# Patient Record
Sex: Male | Born: 1945 | Race: White | Hispanic: No | Marital: Married
Health system: Southern US, Community
[De-identification: ages and names within clinical notes are randomized; demographics above are authoritative.]

## PROBLEM LIST (undated history)

## (undated) DIAGNOSIS — C61 Malignant neoplasm of prostate: Secondary | ICD-10-CM

## (undated) DIAGNOSIS — I639 Cerebral infarction, unspecified: Secondary | ICD-10-CM

## (undated) DIAGNOSIS — I2510 Atherosclerotic heart disease of native coronary artery without angina pectoris: Secondary | ICD-10-CM

## (undated) DIAGNOSIS — G43909 Migraine, unspecified, not intractable, without status migrainosus: Secondary | ICD-10-CM

## (undated) DIAGNOSIS — M1990 Unspecified osteoarthritis, unspecified site: Secondary | ICD-10-CM

## (undated) DIAGNOSIS — K219 Gastro-esophageal reflux disease without esophagitis: Secondary | ICD-10-CM

## (undated) DIAGNOSIS — I10 Essential (primary) hypertension: Secondary | ICD-10-CM

## (undated) HISTORY — PX: EYE SURGERY: SHX253

## (undated) HISTORY — PX: PROSTATE BIOPSY: SHX241

## (undated) HISTORY — DX: Migraine, unspecified, not intractable, without status migrainosus: G43.909

## (undated) HISTORY — DX: Cerebral infarction, unspecified: I63.9

## (undated) HISTORY — PX: BACK SURGERY: SHX140

---

## 1981-11-01 HISTORY — PX: BACK SURGERY: SHX140

## 2013-06-10 ENCOUNTER — Emergency Department (HOSPITAL_COMMUNITY): Payer: Medicare HMO

## 2013-06-10 ENCOUNTER — Inpatient Hospital Stay (HOSPITAL_COMMUNITY)
Admission: EM | Admit: 2013-06-10 | Discharge: 2013-06-11 | DRG: 066 | Disposition: A | Discharge status: Home / Self Care | Payer: Medicare HMO | Attending: Internal Medicine | Admitting: Internal Medicine

## 2013-06-10 ENCOUNTER — Encounter (HOSPITAL_COMMUNITY): Payer: Self-pay | Admitting: *Deleted

## 2013-06-10 DIAGNOSIS — Z8249 Family history of ischemic heart disease and other diseases of the circulatory system: Secondary | ICD-10-CM

## 2013-06-10 DIAGNOSIS — Z7902 Long term (current) use of antithrombotics/antiplatelets: Secondary | ICD-10-CM

## 2013-06-10 DIAGNOSIS — R7309 Other abnormal glucose: Secondary | ICD-10-CM | POA: Diagnosis not present

## 2013-06-10 DIAGNOSIS — G459 Transient cerebral ischemic attack, unspecified: Secondary | ICD-10-CM | POA: Diagnosis present

## 2013-06-10 DIAGNOSIS — I63219 Cerebral infarction due to unspecified occlusion or stenosis of unspecified vertebral arteries: Principal | ICD-10-CM | POA: Diagnosis present

## 2013-06-10 DIAGNOSIS — I639 Cerebral infarction, unspecified: Secondary | ICD-10-CM

## 2013-06-10 DIAGNOSIS — Z7982 Long term (current) use of aspirin: Secondary | ICD-10-CM

## 2013-06-10 DIAGNOSIS — I672 Cerebral atherosclerosis: Secondary | ICD-10-CM | POA: Diagnosis present

## 2013-06-10 DIAGNOSIS — I10 Essential (primary) hypertension: Secondary | ICD-10-CM | POA: Diagnosis present

## 2013-06-10 DIAGNOSIS — R42 Dizziness and giddiness: Secondary | ICD-10-CM | POA: Diagnosis present

## 2013-06-10 DIAGNOSIS — I6502 Occlusion and stenosis of left vertebral artery: Secondary | ICD-10-CM

## 2013-06-10 DIAGNOSIS — Z79899 Other long term (current) drug therapy: Secondary | ICD-10-CM

## 2013-06-10 DIAGNOSIS — Z808 Family history of malignant neoplasm of other organs or systems: Secondary | ICD-10-CM

## 2013-06-10 HISTORY — DX: Cerebral infarction, unspecified: I63.9

## 2013-06-10 HISTORY — DX: Unspecified osteoarthritis, unspecified site: M19.90

## 2013-06-10 HISTORY — DX: Essential (primary) hypertension: I10

## 2013-06-10 LAB — CBC
Hemoglobin: 15.6 g/dL (ref 13.0–17.0)
MCH: 29.9 pg (ref 26.0–34.0)
MCV: 83.7 fL (ref 78.0–100.0)
RBC: 5.22 MIL/uL (ref 4.22–5.81)

## 2013-06-10 LAB — BASIC METABOLIC PANEL
BUN: 14 mg/dL (ref 6–23)
CO2: 24 mEq/L (ref 19–32)
Calcium: 9.2 mg/dL (ref 8.4–10.5)
Creatinine, Ser: 0.82 mg/dL (ref 0.50–1.35)
Glucose, Bld: 173 mg/dL — ABNORMAL HIGH (ref 70–99)

## 2013-06-10 MED ORDER — ASPIRIN 325 MG PO TABS
325.0000 mg | ORAL_TABLET | Freq: Once | ORAL | Status: AC
  Administered 2013-06-10: 325 mg via ORAL
  Filled 2013-06-10: qty 1

## 2013-06-10 MED ORDER — SODIUM CHLORIDE 0.9 % IV BOLUS (SEPSIS)
500.0000 mL | Freq: Once | INTRAVENOUS | Status: AC
  Administered 2013-06-10: 500 mL via INTRAVENOUS

## 2013-06-10 MED ORDER — SODIUM CHLORIDE 0.9 % IV BOLUS (SEPSIS): 500.0000 mL | Freq: Once | INTRAVENOUS | Status: DC

## 2013-06-10 MED ORDER — IOHEXOL 350 MG/ML SOLN
100.0000 mL | Freq: Once | INTRAVENOUS | Status: AC | PRN
  Administered 2013-06-10: 100 mL via INTRAVENOUS

## 2013-06-10 NOTE — ED Provider Notes (Signed)
CSN: [id number]     Arrival date & time 06/10/13  1944 History     First MD Initiated Contact with Patient 06/10/13 2016     Chief Complaint  Patient presents with  . Dizziness   (Consider location/radiation/quality/duration/timing/severity/associated sxs/prior Treatment) HPI Comments: 67 year old male with a past medical history of hypertension presents to the emergency department complaining of intermittent dizziness x2 weeks. Dizziness worse when going from a laying to standing position. 2 days ago he was outside mowing the lawn and developed left eye pain and jaw pain which lasted about 10 minutes and then subsided spontaneously. Those symptoms have not returned. Denies visual disturbance. Earlier today the dizziness became very severe, he developed mild left-sided nonradiating back pain behind his scapula, worse with movement. He then became very diaphoretic, soaking in entire shirt per wife. Prior to coming to the emergency department and got very nauseous and began vomiting. Denies chest pain, shortness of breath, confusion. No history of heart attack or stroke. He does not have a specific primary care physician, blood pressure medications are prescribed at the [place].  The history is provided by the patient and the spouse.    Past Medical History  Diagnosis Date  . Hypertension   . Arthritis    Past Surgical History  Procedure Laterality Date  . Back surgery     No family history on file. History  Substance Use Topics  . Smoking status: Never Smoker   . Smokeless tobacco: Not on file  . Alcohol Use: No    Review of Systems  Constitutional: Positive for diaphoresis. Negative for fever and chills.  HENT: Negative for neck pain and neck stiffness.   Eyes: Positive for pain. Negative for visual disturbance.  Respiratory: Negative for shortness of breath.   Cardiovascular: Negative for chest pain.  Gastrointestinal: Positive for nausea and vomiting. Negative for abdominal pain.   Musculoskeletal: Positive for back pain.  Neurological: Positive for dizziness and headaches. Negative for syncope and speech difficulty.  Psychiatric/Behavioral: Negative for confusion.  All other systems reviewed and are negative.    Allergies  Review of patient's allergies indicates no known allergies.  Home Medications   Current Outpatient Rx  Name  Route  Sig  Dispense  Refill  . amLODipine (NORVASC) 5 MG tablet   Oral   Take 5 mg by mouth daily.          BP 149/70  Pulse 88  Temp(Src) 97.9 F (36.6 C) (Oral)  Resp 18  Wt 205 lb (92.987 kg)  SpO2 100% Physical Exam  Nursing note and vitals reviewed. Constitutional: He is oriented to person, place, and time. He appears well-developed and well-nourished. No distress.  HENT:  Head: Normocephalic and atraumatic.  Mouth/Throat: Oropharynx is clear and moist.  Eyes: Conjunctivae and EOM are normal. Pupils are equal, round, and reactive to light.  Neck: Normal range of motion. Neck supple. No JVD present.  Cardiovascular: Normal rate, normal heart sounds, intact distal pulses and normal pulses.  An irregular rhythm present.  No extremity edema.  Pulmonary/Chest: Effort normal and breath sounds normal. He has no decreased breath sounds. He has no wheezes. He has no rhonchi. He has no rales. He exhibits no tenderness.  Abdominal: Soft. Bowel sounds are normal. There is no tenderness.  Musculoskeletal: Normal range of motion. He exhibits no edema.       Thoracic back: He exhibits no tenderness.  No scapular tenderness.  Neurological: He is alert and oriented to person, place, and  time. He has normal strength. No cranial nerve deficit or sensory deficit.  Skin: Skin is warm and dry. He is not diaphoretic.  Psychiatric: He has a normal mood and affect. His behavior is normal.    ED Course   Procedures (including critical care time)  Labs Reviewed  CBC  BASIC METABOLIC PANEL  TROPONIN I    Date: 06/10/2013  Rate:  74  Rhythm: normal sinus rhythm and Ventricular trigeminy  QRS Axis: left  Intervals: normal  ST/T Wave abnormalities: normal  Conduction Disutrbances:Borderline intraventricular conduction delay  Narrative Interpretation: No STEMI  Old EKG Reviewed: none available   Ct Angio Head W/cm &/or Wo Cm  06/10/2013   *RADIOLOGY REPORT*  Clinical Data:  Dizziness.  On and off for 2 weeks.  Left cheek and eye changes for 10 minutes.  CT ANGIOGRAPHY HEAD AND NECK  Technique:  Multidetector CT imaging of the head and neck was performed using the standard protocol during bolus administration of intravenous contrast.  Multiplanar CT image reconstructions including MIPs were obtained to evaluate the vascular anatomy. Carotid stenosis measurements (when applicable) are obtained utilizing NASCET criteria, using the distal internal carotid diameter as the denominator.  Contrast: OMNIPAQUE IOHEXOL 350 MG/ML SOLN  Comparison:   None.  CTA NECK  Findings:  Aortic arch is not imaged. The proximal great vessels are also not included.  Cervical carotid atherosclerosis at the bifurcations, without significant stenosis.  Left vertebral artery occlusion, or effective occlusion, beginning at the V1-V2 junction, continuing nearly all the way to the basilar.  Mild left vertebral artery dominance.  The right vertebral artery is patent, although there is moderate to advanced narrowing just beyond the dural penetration, presumably atherosclerotic.   Review of the MIP images confirms the above findings.  IMPRESSION: 1.  Left vertebral artery occlusion beginning at the proximal V2 segment, continuing just proximal to the vertebral basilar junction.  Likely dominant left AICA, serving the majority of the left inferior cerebellum. 2.  Tandem moderate to advanced right intradural vertebral artery stenoses, presumably atherosclerotic.  3.  Cervical carotid bifurcation atherosclerosis, without significant stenosis. 4.  Aortic arch and  proximal great vessels not imaged.  Findings:  Noncontrast portion:  No acute osseous findings.  The imaged paranasal sinuses are essentially clear.  Orbits are unremarkable.  The left vertebral artery is high density, correlating with thrombosis.  No definite acute ischemia in the posterior circulation territories, although in the setting of dizziness there may be an occult infarct. No delayed enhancement to suggest subacute ischemia.  No hemorrhage, hydrocephalus, or evidence of mass lesion.  Anterior circulation notable for moderate carotid siphon atherosclerosis with luminal irregularities.  No high-grade stenosis.  No anterior circulation aneurysm.  Posterior circulation positive as above, with left V3 and V4 occlusion and moderate to advanced tandem right V4 stenoses around 1 cm apart.  Dominant left AICA.  Fetal type left PCA, although left P1 is present.  No significant stenosis involving PCA territories.  No aneurysm.   Review of the MIP images confirms the above findings.  These results were called by telephone on 06/10/2013 at 22 30 to P A Albert, who verbally acknowledged these results.  IMPRESSION:  1.  Left vertebral artery occlusion, as above. No acute infarct is detected, but in the setting of dizziness there may be small cerebellar infarcts.  Consider MRI correlate. A large left AICA serves the majority of the inferior left cerebellum.  2.  Tandem moderate to advanced intradural right vertebral  artery stenoses - presumably atherosclerotic  3.  Moderate carotid siphon atherosclerosis.  4. Large left PCOM artery.   Original Report Authenticated By: Tiburcio Pea   Dg Chest 2 View  06/10/2013   *RADIOLOGY REPORT*  Clinical Data: Syncope and dizziness.  CHEST - 2 VIEW  Comparison: No priors for  Findings: Lung volumes are normal.  No consolidative airspace disease.  No pleural effusions.  No pneumothorax.  No pulmonary nodule or mass noted.  Pulmonary vasculature and the cardiomediastinal silhouette  are within normal limits. Atherosclerosis of the thoracic aorta.  IMPRESSION: 1. No radiographic evidence of acute cardiopulmonary disease. 2.  Atherosclerosis.   Original Report Authenticated By: Trudie Reed, M.D.   Ct Angio Neck W/cm &/or Wo/cm  06/10/2013   *RADIOLOGY REPORT*  Clinical Data:  Dizziness.  On and off for 2 weeks.  Left cheek and eye changes for 10 minutes.  CT ANGIOGRAPHY HEAD AND NECK  Technique:  Multidetector CT imaging of the head and neck was performed using the standard protocol during bolus administration of intravenous contrast.  Multiplanar CT image reconstructions including MIPs were obtained to evaluate the vascular anatomy. Carotid stenosis measurements (when applicable) are obtained utilizing NASCET criteria, using the distal internal carotid diameter as the denominator.  Contrast: OMNIPAQUE IOHEXOL 350 MG/ML SOLN  Comparison:   None.  CTA NECK  Findings:  Aortic arch is not imaged. The proximal great vessels are also not included.  Cervical carotid atherosclerosis at the bifurcations, without significant stenosis.  Left vertebral artery occlusion, or effective occlusion, beginning at the V1-V2 junction, continuing nearly all the way to the basilar.  Mild left vertebral artery dominance.  The right vertebral artery is patent, although there is moderate to advanced narrowing just beyond the dural penetration, presumably atherosclerotic.   Review of the MIP images confirms the above findings.  IMPRESSION: 1.  Left vertebral artery occlusion beginning at the proximal V2 segment, continuing just proximal to the vertebral basilar junction.  Likely dominant left AICA, serving the majority of the left inferior cerebellum. 2.  Tandem moderate to advanced right intradural vertebral artery stenoses, presumably atherosclerotic.  3.  Cervical carotid bifurcation atherosclerosis, without significant stenosis. 4.  Aortic arch and proximal great vessels not imaged.  Findings:   Noncontrast portion:  No acute osseous findings.  The imaged paranasal sinuses are essentially clear.  Orbits are unremarkable.  The left vertebral artery is high density, correlating with thrombosis.  No definite acute ischemia in the posterior circulation territories, although in the setting of dizziness there may be an occult infarct. No delayed enhancement to suggest subacute ischemia.  No hemorrhage, hydrocephalus, or evidence of mass lesion.  Anterior circulation notable for moderate carotid siphon atherosclerosis with luminal irregularities.  No high-grade stenosis.  No anterior circulation aneurysm.  Posterior circulation positive as above, with left V3 and V4 occlusion and moderate to advanced tandem right V4 stenoses around 1 cm apart.  Dominant left AICA.  Fetal type left PCA, although left P1 is present.  No significant stenosis involving PCA territories.  No aneurysm.   Review of the MIP images confirms the above findings.  These results were called by telephone on 06/10/2013 at 22 30 to P A Albert, who verbally acknowledged these results.  IMPRESSION:  1.  Left vertebral artery occlusion, as above. No acute infarct is detected, but in the setting of dizziness there may be small cerebellar infarcts.  Consider MRI correlate. A large left AICA serves the majority of the inferior left  cerebellum.  2.  Tandem moderate to advanced intradural right vertebral artery stenoses - presumably atherosclerotic  3.  Moderate carotid siphon atherosclerosis.  4. Large left PCOM artery.   Original Report Authenticated By: Tiburcio Pea   1. Vertebral artery occlusion, left   2. Dizziness     MDM  Patient with dizziness, diaphoresis and vomiting. Symptoms of left eye pain and jaw pain 2 days back back lasting 10 minutes. No chest pain or shortness of breath. On exam he has an irregular heart rhythm. Concern for cardiac cause or vascular cause. Blood pressure equal in both arms. Neurologic exam unremarkable. Labs  pending- CBC, BMP, troponin. EKG showing ventricular trigeminy, no STEMI. No old EKG to compare. CT head, CT angiogram and neck pending to rule out any dissection. Patient discussed with Dr. Jodi Mourning who agrees with plan of care. 11:32 PM CT angio scan with results as shown above. 325 mg ASA given. He will be admitted to the hospital for further management. Admission accepted by Dr.Kakrakandy, TRH.  Trevor Mace, PA-C 06/10/13 2333

## 2013-06-10 NOTE — H&P (Addendum)
[facility] History and Physical  ABDURAHMAN Myers ZOX:[id number] DOB: 06-Mar-1946 DOA: 06/10/2013  Referring physician: ER physician. PCP: No primary provider on file. [facility].  Chief Complaint: Dizziness.  HPI: Randy Myers is a 67 y.o. male with history of hypertension and arthritis presents with complaints of dizziness. Patient has been feeling dizzy for last 2 weeks. Patient gets dizzy when he tries to stand up or move sideways. His dizziness comes off and on. 3 days ago patient had some left facial numbness with left retro-orbital pain which lasted for around 10 minutes and resolved spontaneously. Today patient after walking in his yard and came back to his house started getting dizzy and became very diaphoretic. At this point patient came to the ER. Patient also had one episode of nausea vomiting. Patient did not lose function of his upper lower extremities. Did not have any difficulty speaking or swallowing. CT angiogram of the head and neck showed left vertebral artery occlusion. Patient has been admitted for further management. Patient denies any chest pain or shortness of breath.  Review of Systems: As presented in the history of presenting illness, rest negative.  Past Medical History  Diagnosis Date  . Hypertension   . Arthritis    Past Surgical History  Procedure Laterality Date  . Back surgery     Social History:  reports that he has never smoked. He does not have any smokeless tobacco history on file. He reports that he does not drink alcohol or use illicit drugs. Home. where does patient live-- Can do ADLs. Can patient participate in ADLs?  No Known Allergies  Family History  Problem Relation Age of Onset  . CAD Father   . Brain cancer Sister       Prior to Admission medications   Medication Sig Start Date End Date Taking? Authorizing Provider  amLODipine (NORVASC) 10 MG tablet Take 5 mg by mouth daily.   Yes Historical Provider, MD  aspirin 325  MG tablet Take 650 mg by mouth every 4 (four) hours as needed for pain.   Yes Historical Provider, MD  ibuprofen (ADVIL,MOTRIN) 200 MG tablet Take 400 mg by mouth every 6 (six) hours as needed for pain.   Yes Historical Provider, MD  Multiple Vitamin (MULTIVITAMIN WITH MINERALS) TABS tablet Take 1 tablet by mouth daily. One a Day   Yes Historical Provider, MD   Physical Exam: Filed Vitals:   06/10/13 1957 06/10/13 2049 06/10/13 2049  BP: 150/90 157/73 149/70  Pulse: 88    Temp: 97.9 F (36.6 C)    TempSrc: Oral    Resp: 18    Weight: 92.987 kg (205 lb)    SpO2: 100%       General:  Well-developed well-nourished.  Eyes: Anicteric no pallor.  ENT: No discharge from the ears eyes nose or mouth.  Neck: No mass felt.  Cardiovascular: S1-S2 heard.  Respiratory: No rhonchi crepitations.  Abdomen: Soft nontender bowel sounds present.  Skin: No rash.  Musculoskeletal: No edema.  Psychiatric: Appears normal.  Neurologic: Alert awake oriented to time place and person. Moves all extremities.  Labs on Admission:  Basic Metabolic Panel:  Recent Labs Lab 06/10/13 2040  NA 139  K 3.5  CL 103  CO2 24  GLUCOSE 173*  BUN 14  CREATININE 0.82  CALCIUM 9.2   Liver Function Tests: No results found for this basename: AST, ALT, ALKPHOS, BILITOT, PROT, ALBUMIN,  in the last 168 hours No results found for this  basename: LIPASE, AMYLASE,  in the last 168 hours No results found for this basename: AMMONIA,  in the last 168 hours CBC:  Recent Labs Lab 06/10/13 2040  WBC 7.8  HGB 15.6  HCT 43.7  MCV 83.7  PLT 173   Cardiac Enzymes:  Recent Labs Lab 06/10/13 2040  TROPONINI <0.30    BNP (last 3 results) No results found for this basename: PROBNP,  in the last 8760 hours CBG: No results found for this basename: GLUCAP,  in the last 168 hours  Radiological Exams on Admission: Ct Angio Head W/cm &/or Wo Cm  06/10/2013   *RADIOLOGY REPORT*  Clinical Data:  Dizziness.   On and off for 2 weeks.  Left cheek and eye changes for 10 minutes.  CT ANGIOGRAPHY HEAD AND NECK  Technique:  Multidetector CT imaging of the head and neck was performed using the standard protocol during bolus administration of intravenous contrast.  Multiplanar CT image reconstructions including MIPs were obtained to evaluate the vascular anatomy. Carotid stenosis measurements (when applicable) are obtained utilizing NASCET criteria, using the distal internal carotid diameter as the denominator.  Contrast: OMNIPAQUE IOHEXOL 350 MG/ML SOLN  Comparison:   None.  CTA NECK  Findings:  Aortic arch is not imaged. The proximal great vessels are also not included.  Cervical carotid atherosclerosis at the bifurcations, without significant stenosis.  Left vertebral artery occlusion, or effective occlusion, beginning at the V1-V2 junction, continuing nearly all the way to the basilar.  Mild left vertebral artery dominance.  The right vertebral artery is patent, although there is moderate to advanced narrowing just beyond the dural penetration, presumably atherosclerotic.   Review of the MIP images confirms the above findings.  IMPRESSION: 1.  Left vertebral artery occlusion beginning at the proximal V2 segment, continuing just proximal to the vertebral basilar junction.  Likely dominant left AICA, serving the majority of the left inferior cerebellum. 2.  Tandem moderate to advanced right intradural vertebral artery stenoses, presumably atherosclerotic.  3.  Cervical carotid bifurcation atherosclerosis, without significant stenosis. 4.  Aortic arch and proximal great vessels not imaged.  Findings:  Noncontrast portion:  No acute osseous findings.  The imaged paranasal sinuses are essentially clear.  Orbits are unremarkable.  The left vertebral artery is high density, correlating with thrombosis.  No definite acute ischemia in the posterior circulation territories, although in the setting of dizziness there may be an  occult infarct. No delayed enhancement to suggest subacute ischemia.  No hemorrhage, hydrocephalus, or evidence of mass lesion.  Anterior circulation notable for moderate carotid siphon atherosclerosis with luminal irregularities.  No high-grade stenosis.  No anterior circulation aneurysm.  Posterior circulation positive as above, with left V3 and V4 occlusion and moderate to advanced tandem right V4 stenoses around 1 cm apart.  Dominant left AICA.  Fetal type left PCA, although left P1 is present.  No significant stenosis involving PCA territories.  No aneurysm.   Review of the MIP images confirms the above findings.  These results were called by telephone on 06/10/2013 at 22 30 to P A Albert, who verbally acknowledged these results.  IMPRESSION:  1.  Left vertebral artery occlusion, as above. No acute infarct is detected, but in the setting of dizziness there may be small cerebellar infarcts.  Consider MRI correlate. A large left AICA serves the majority of the inferior left cerebellum.  2.  Tandem moderate to advanced intradural right vertebral artery stenoses - presumably atherosclerotic  3.  Moderate carotid siphon  atherosclerosis.  4. Large left PCOM artery.   Original Report Authenticated By: Tiburcio Pea   Dg Chest 2 View  06/10/2013   *RADIOLOGY REPORT*  Clinical Data: Syncope and dizziness.  CHEST - 2 VIEW  Comparison: No priors for  Findings: Lung volumes are normal.  No consolidative airspace disease.  No pleural effusions.  No pneumothorax.  No pulmonary nodule or mass noted.  Pulmonary vasculature and the cardiomediastinal silhouette are within normal limits. Atherosclerosis of the thoracic aorta.  IMPRESSION: 1. No radiographic evidence of acute cardiopulmonary disease. 2.  Atherosclerosis.   Original Report Authenticated By: Trudie Reed, M.D.   Ct Angio Neck W/cm &/or Wo/cm  06/10/2013   *RADIOLOGY REPORT*  Clinical Data:  Dizziness.  On and off for 2 weeks.  Left cheek and eye changes  for 10 minutes.  CT ANGIOGRAPHY HEAD AND NECK  Technique:  Multidetector CT imaging of the head and neck was performed using the standard protocol during bolus administration of intravenous contrast.  Multiplanar CT image reconstructions including MIPs were obtained to evaluate the vascular anatomy. Carotid stenosis measurements (when applicable) are obtained utilizing NASCET criteria, using the distal internal carotid diameter as the denominator.  Contrast: OMNIPAQUE IOHEXOL 350 MG/ML SOLN  Comparison:   None.  CTA NECK  Findings:  Aortic arch is not imaged. The proximal great vessels are also not included.  Cervical carotid atherosclerosis at the bifurcations, without significant stenosis.  Left vertebral artery occlusion, or effective occlusion, beginning at the V1-V2 junction, continuing nearly all the way to the basilar.  Mild left vertebral artery dominance.  The right vertebral artery is patent, although there is moderate to advanced narrowing just beyond the dural penetration, presumably atherosclerotic.   Review of the MIP images confirms the above findings.  IMPRESSION: 1.  Left vertebral artery occlusion beginning at the proximal V2 segment, continuing just proximal to the vertebral basilar junction.  Likely dominant left AICA, serving the majority of the left inferior cerebellum. 2.  Tandem moderate to advanced right intradural vertebral artery stenoses, presumably atherosclerotic.  3.  Cervical carotid bifurcation atherosclerosis, without significant stenosis. 4.  Aortic arch and proximal great vessels not imaged.  Findings:  Noncontrast portion:  No acute osseous findings.  The imaged paranasal sinuses are essentially clear.  Orbits are unremarkable.  The left vertebral artery is high density, correlating with thrombosis.  No definite acute ischemia in the posterior circulation territories, although in the setting of dizziness there may be an occult infarct. No delayed enhancement to suggest  subacute ischemia.  No hemorrhage, hydrocephalus, or evidence of mass lesion.  Anterior circulation notable for moderate carotid siphon atherosclerosis with luminal irregularities.  No high-grade stenosis.  No anterior circulation aneurysm.  Posterior circulation positive as above, with left V3 and V4 occlusion and moderate to advanced tandem right V4 stenoses around 1 cm apart.  Dominant left AICA.  Fetal type left PCA, although left P1 is present.  No significant stenosis involving PCA territories.  No aneurysm.   Review of the MIP images confirms the above findings.  These results were called by telephone on 06/10/2013 at 22 30 to P A Albert, who verbally acknowledged these results.  IMPRESSION:  1.  Left vertebral artery occlusion, as above. No acute infarct is detected, but in the setting of dizziness there may be small cerebellar infarcts.  Consider MRI correlate. A large left AICA serves the majority of the inferior left cerebellum.  2.  Tandem moderate to advanced intradural right vertebral  artery stenoses - presumably atherosclerotic  3.  Moderate carotid siphon atherosclerosis.  4. Large left PCOM artery.   Original Report Authenticated By: Tiburcio Pea    EKG: Independently reviewed. Normal sinus rhythm with PVCs.  Assessment/Plan Principal Problem:   Dizziness Active Problems:   HTN (hypertension)   1. Dizziness - symptoms concerning for TIA versus CVA. Patient has been admitted for further management. Placed on neurochecks. I have discussed with neurologist on-call Dr. Noel Christmas. Patient will be transferred to [facility] for further management. MRI brain and 2-D echo. Check orthostatics. Gently hydrate. Aspirin. 2. Left vertebral artery occlusion - per neurologist. 3. Arthritis - continue home medications. 4. Hypertension - continue home medications. 5. Mild hyperglycemia - check hemoglobin A1c.    Code Status: Full code.  Family Communication: Patient's wife at the bedside.   Disposition Plan: Admit to inpatient.    Chavie Kolinski N. [facility] Pager [phone].  If 7PM-7AM, please contact night-coverage [email or web address] Password [facility] 06/10/2013, 11:47 PM

## 2013-06-10 NOTE — ED Notes (Signed)
Patient transported to CT 

## 2013-06-10 NOTE — ED Notes (Signed)
Dizziness and vomiting, can not stand due to dizziness, Pt has been dizzy on and off for about 2 weeks, Friday had episode of severe headache, left cheek numbness and left eye changes, lasted 10 min. Today continues with headache and dizziness and vomiting.

## 2013-06-11 ENCOUNTER — Inpatient Hospital Stay (HOSPITAL_COMMUNITY): Payer: Medicare HMO

## 2013-06-11 DIAGNOSIS — I6509 Occlusion and stenosis of unspecified vertebral artery: Secondary | ICD-10-CM

## 2013-06-11 DIAGNOSIS — G459 Transient cerebral ischemic attack, unspecified: Secondary | ICD-10-CM | POA: Diagnosis present

## 2013-06-11 LAB — CBC WITH DIFFERENTIAL/PLATELET
Eosinophils Absolute: 0 10*3/uL (ref 0.0–0.7)
Hemoglobin: 15.1 g/dL (ref 13.0–17.0)
Lymphocytes Relative: 15 % (ref 12–46)
Lymphs Abs: 1.1 10*3/uL (ref 0.7–4.0)
MCH: 29.7 pg (ref 26.0–34.0)
MCV: 83.9 fL (ref 78.0–100.0)
Monocytes Relative: 7 % (ref 3–12)
Neutrophils Relative %: 78 % — ABNORMAL HIGH (ref 43–77)
RBC: 5.08 MIL/uL (ref 4.22–5.81)
WBC: 7.3 10*3/uL (ref 4.0–10.5)

## 2013-06-11 LAB — HEMOGLOBIN A1C: Mean Plasma Glucose: 111 mg/dL (ref <117)

## 2013-06-11 LAB — COMPREHENSIVE METABOLIC PANEL
AST: 17 U/L (ref 0–37)
Albumin: 3.8 g/dL (ref 3.5–5.2)
Calcium: 8.9 mg/dL (ref 8.4–10.5)
Creatinine, Ser: 0.71 mg/dL (ref 0.50–1.35)
GFR calc non Af Amer: >90 mL/min (ref >90)

## 2013-06-11 LAB — LIPID PANEL: LDL Cholesterol: 99 mg/dL (ref 0–99)

## 2013-06-11 MED ORDER — ATORVASTATIN CALCIUM 40 MG PO TABS: 40.0000 mg | ORAL_TABLET | Freq: Every day | ORAL | Status: DC

## 2013-06-11 MED ORDER — CLOPIDOGREL BISULFATE 75 MG PO TABS
75.0000 mg | ORAL_TABLET | Freq: Every day | ORAL | Status: DC
  Administered 2013-06-11: 75 mg via ORAL
  Filled 2013-06-11: qty 1

## 2013-06-11 MED ORDER — ASPIRIN 325 MG PO TABS: 325.0000 mg | ORAL_TABLET | Freq: Every day | ORAL | Status: DC

## 2013-06-11 MED ORDER — ADULT MULTIVITAMIN W/MINERALS CH
1.0000 | ORAL_TABLET | Freq: Every day | ORAL | Status: DC
  Administered 2013-06-11: 1 via ORAL
  Filled 2013-06-11: qty 1

## 2013-06-11 MED ORDER — AMLODIPINE BESYLATE 5 MG PO TABS
5.0000 mg | ORAL_TABLET | Freq: Every day | ORAL | Status: DC
Start: 2013-06-11 — End: 2013-06-11
  Administered 2013-06-11: 5 mg via ORAL
  Filled 2013-06-11: qty 1

## 2013-06-11 MED ORDER — SODIUM CHLORIDE 0.9 % IV SOLN
INTRAVENOUS | Status: DC
  Administered 2013-06-11: 04:00:00 via INTRAVENOUS

## 2013-06-11 MED ORDER — CLOPIDOGREL BISULFATE 75 MG PO TABS: 75.0000 mg | ORAL_TABLET | Freq: Every day | ORAL | Status: DC

## 2013-06-11 MED ORDER — ASPIRIN 81 MG PO TBEC: 81.0000 mg | DELAYED_RELEASE_TABLET | Freq: Every day | ORAL | Status: DC

## 2013-06-11 MED ORDER — SENNOSIDES-DOCUSATE SODIUM 8.6-50 MG PO TABS: 1.0000 | ORAL_TABLET | Freq: Every evening | ORAL | Status: DC | PRN

## 2013-06-11 MED ORDER — ENOXAPARIN SODIUM 40 MG/0.4ML ~~LOC~~ SOLN
40.0000 mg | SUBCUTANEOUS | Status: DC
  Administered 2013-06-11: 40 mg via SUBCUTANEOUS
  Filled 2013-06-11: qty 0.4

## 2013-06-11 MED ORDER — ASPIRIN 300 MG RE SUPP: 300.0000 mg | Freq: Every day | RECTAL | Status: DC

## 2013-06-11 MED ORDER — ASPIRIN 325 MG PO TABS: 650.0000 mg | ORAL_TABLET | ORAL | Status: DC | PRN

## 2013-06-11 MED ORDER — ONDANSETRON HCL 4 MG/2ML IJ SOLN
4.0000 mg | Freq: Four times a day (QID) | INTRAMUSCULAR | Status: DC | PRN
  Administered 2013-06-11: 4 mg via INTRAVENOUS
  Filled 2013-06-11: qty 2

## 2013-06-11 MED ORDER — PROMETHAZINE HCL 25 MG PO TABS: 25.0000 mg | ORAL_TABLET | Freq: Four times a day (QID) | ORAL | Status: DC | PRN

## 2013-06-11 MED ORDER — ASPIRIN EC 81 MG PO TBEC
81.0000 mg | DELAYED_RELEASE_TABLET | Freq: Every day | ORAL | Status: DC
  Administered 2013-06-11: 81 mg via ORAL
  Filled 2013-06-11: qty 1

## 2013-06-11 NOTE — Progress Notes (Signed)
Discharge orders received. Pt wanting to go home today. Case manager on call notified of patient discharge orders and therapy needs. Informed this RN to let RN case manager for floor courtney know tomorrow AM so she can set it up even thought the patient will be discharged already. Will pass on to charge RN to let case manager know. Education completed and IV removed. Informed pt that he could get a walker at a medical supply store and he understands. Will discharge home with wife via wheelchair. Valta Dillon, [place]

## 2013-06-11 NOTE — Evaluation (Addendum)
Physical Therapy Evaluation Patient Details Name: Randy Myers MRN: [id number] DOB: 04-13-1946 Today's Date: 06/11/2013 Time: 2536-6440 PT Time Calculation (min): 28 min  PT Assessment / Plan / Recommendation History of Present Illness  Randy Myers is an 67 y.o. male with a history of hypertension and arthritis who has been experiencing recurrent spells of dizziness with vertigo with associated nausea for since 06/08/2013. There was associated numbness of the left side of his face with first episode. He had a severe spell today which made him very unsteady on his feet in addition to having nausea and vomiting. He said no visual changes. There were no speech changes. 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 Left vertebral artery showed hypodensity indicative of thrombosis. No ischemia of the posterior circulation territories was noted. Further areas of occlusion involving left V3 and V4 segments were noted, and moderate to advanced tandem right V4 stenoses are also noted. No high-grade stenosis was noted involving anterior circulation.  Clinical Impression  Patient presents with ataxia, decreased balance, gait disturbance, impacting functional mobility and safety.  Patient requires use of RW and physical assist with OOB/ambulation for safety.  Discussed this need with patient - reports wife works mid-day and son will be nearby.  Stressed need for physical assist for any mobility.  Feel patient will benefit from acute PT to maximize independence prior to discharge.  Recommend HHPT initially until patient and wife can safely negotiate stairs - then transition to OP PT.  (Addendum:  Patient and family prefer OP PT due to having house remodeled.  They feel that wife and son can get patient to OP PT appointments.)    PT Assessment  Patient needs continued PT services    Follow Up  Recommendations  OP PT;Supervision/Assistance - 24 hour;Supervision for mobility/OOB    Does the patient have the potential to tolerate intense rehabilitation      Barriers to Discharge Decreased caregiver support Wife works mid-day.  Patient requires physical assist for mobility.    Equipment Recommendations  Rolling walker with 5" wheels    Recommendations for Other Services     Frequency Min 4X/week    Precautions / Restrictions Precautions Precautions: Fall Restrictions Weight Bearing Restrictions: No   Pertinent Vitals/Pain       Mobility  Bed Mobility Bed Mobility: Supine to Sit;Sitting - Scoot to Edge of Bed;Sit to Supine Supine to Sit: 5: Supervision Sitting - Scoot to Edge of Bed: 5: Supervision Sit to Supine: 5: Supervision Details for Bed Mobility Assistance: No physical assist needed.  Supervision for safety Transfers Transfers: Sit to Stand;Stand to Sit Sit to Stand: 4: Min assist;With upper extremity assist;From bed Stand to Sit: 4: Min assist;With upper extremity assist;To bed Details for Transfer Assistance: Verbal cues for hand placement and to move more slowly.  Required min assist for balance and safety.  Ambulation/Gait Ambulation/Gait Assistance: 4: Min assist;3: Mod assist Ambulation Distance (Feet): 160 Feet Assistive device: Rolling walker Ambulation/Gait Assistance Details: Verbal cues for safe use of RW.  Patient shifting to left side during gait, to point of losing balance.  Patient unable to sefl-correct, requiring mod assist to prevent fall x4 during gait.  Worked in standing to minimize weight shift to left with right swing.  Then performed ambulation 62' with focus on controlling weight shift to left.  Patient continued to have loss of balance x2, requiring assist to prevent  fall. Gait Pattern: Step-through pattern;Ataxic;Lateral trunk lean to left;Trunk flexed (Increased weight shift to left side) Modified Rankin (Stroke Patients  Only) Pre-Morbid Rankin Score: No symptoms Modified Rankin: Moderately severe disability    Exercises     PT Diagnosis: Difficulty walking;Abnormality of gait (Ataxia)  PT Problem List: Decreased activity tolerance;Decreased balance;Decreased mobility;Decreased coordination;Decreased knowledge of use of DME PT Treatment Interventions: DME instruction;Gait training;Stair training;Functional mobility training;Balance training;Patient/family education     PT Goals(Current goals can be found in the care plan section) Acute Rehab PT Goals Patient Stated Goal: To go home PT Goal Formulation: With patient/family Time For Goal Achievement: 06/18/13 Potential to Achieve Goals: Good  Visit Information  Last PT Received On: 06/11/13 Assistance Needed: +1 History of Present Illness: Randy Myers is an 67 y.o. male with a history of hypertension and arthritis who has been experiencing recurrent spells of dizziness with vertigo with associated nausea for since 06/08/2013. There was associated numbness of the left side of his face with first episode. He had a severe spell today which made him very unsteady on his feet in addition to having nausea and vomiting. He said no visual changes. There were no speech changes. 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 Left vertebral artery showed hypodensity indicative of thrombosis. No ischemia of the posterior circulation territories was noted. Further areas of occlusion involving left V3 and V4 segments were noted, and moderate to advanced tandem right V4 stenoses are also noted. No high-grade stenosis was noted involving anterior circulation.       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 UE Lower Extremity Assessment Lower Extremity Assessment: LLE deficits/detail LLE Coordination: decreased gross motor (Decreased coordination with heel-to-shin) Cervical / Trunk Assessment Cervical / Trunk Assessment: Kyphotic   Balance Balance Balance Assessed: Yes Static Standing Balance Static Standing - Balance Support: No upper extremity supported Static Standing - Level of Assistance: 4: Min assist Static Standing - Comment/# of Minutes: Patient leaning to left Dynamic Standing Balance Dynamic Standing - Balance Support: Bilateral upper extremity supported Dynamic Standing - Level of Assistance: 3: Mod assist Dynamic Standing - Balance Activities: Lateral lean/weight shifting;Forward lean/weight shifting;Reaching across midline (Weight shift with stepping) Dynamic Standing - Comments: Required mod assist to prevent fall during dynamic activities, even with patient using bil UE's for support.  End of Session PT - End of Session Equipment Utilized During Treatment: Gait belt Activity Tolerance: Patient tolerated treatment well Patient left: in bed;with call bell/phone within reach;with family/visitor present Nurse Communication:  Mobility status  GP     Vena Austria 06/11/2013, 2:20 PM Durenda Hurt. Renaldo Fiddler, [facility] Acute Rehab Services Pager [phone]

## 2013-06-11 NOTE — Progress Notes (Signed)
*  PRELIMINARY RESULTS* Echocardiogram 2D Echocardiogram has been performed.  Jeryl Columbia 06/11/2013, 10:14 AM

## 2013-06-11 NOTE — Discharge Summary (Signed)
Physician Discharge Summary  RUBIN DAIS MRN: [id number] DOB/AGE: 67-Aug-1947 67 y.o.  PCP: No primary provider on file.   Admit date: 06/10/2013 Discharge date: 06/11/2013  Discharge Diagnoses:   vertebral artery thrombosis Dizziness Active Problems:   HTN (hypertension)   TIA (transient ischemic attack)     Medication List    STOP taking these medications       aspirin 325 MG tablet     ibuprofen 200 MG tablet  Commonly known as:  ADVIL,MOTRIN      TAKE these medications       amLODipine 10 MG tablet  Commonly known as:  NORVASC  Take 5 mg by mouth daily.     clopidogrel 75 MG tablet  Commonly known as:  PLAVIX  Take 1 tablet (75 mg total) by mouth daily with breakfast.     multivitamin with minerals Tabs tablet  Take 1 tablet by mouth daily. One a Day        Discharge Condition: Stable  Disposition: Final discharge disposition not confirmed   Consults: Neurology  Significant Diagnostic Studies: Ct Angio Head W/cm &/or Wo Cm  06/10/2013   *RADIOLOGY REPORT*  Clinical Data:  Dizziness.  On and off for 2 weeks.  Left cheek and eye changes for 10 minutes.  CT ANGIOGRAPHY HEAD AND NECK  Technique:  Multidetector CT imaging of the head and neck was performed using the standard protocol during bolus administration of intravenous contrast.  Multiplanar CT image reconstructions including MIPs were obtained to evaluate the vascular anatomy. Carotid stenosis measurements (when applicable) are obtained utilizing NASCET criteria, using the distal internal carotid diameter as the denominator.  Contrast: OMNIPAQUE IOHEXOL 350 MG/ML SOLN  Comparison:   None.  CTA NECK  Findings:  Aortic arch is not imaged. The proximal great vessels are also not included.  Cervical carotid atherosclerosis at the bifurcations, without significant stenosis.  Left vertebral artery occlusion, or effective occlusion, beginning at the V1-V2 junction, continuing nearly all the way to  the basilar.  Mild left vertebral artery dominance.  The right vertebral artery is patent, although there is moderate to advanced narrowing just beyond the dural penetration, presumably atherosclerotic.   Review of the MIP images confirms the above findings.  IMPRESSION: 1.  Left vertebral artery occlusion beginning at the proximal V2 segment, continuing just proximal to the vertebral basilar junction.  Likely dominant left AICA, serving the majority of the left inferior cerebellum. 2.  Tandem moderate to advanced right intradural vertebral artery stenoses, presumably atherosclerotic.  3.  Cervical carotid bifurcation atherosclerosis, without significant stenosis. 4.  Aortic arch and proximal great vessels not imaged.  Findings:  Noncontrast portion:  No acute osseous findings.  The imaged paranasal sinuses are essentially clear.  Orbits are unremarkable.  The left vertebral artery is high density, correlating with thrombosis.  No definite acute ischemia in the posterior circulation territories, although in the setting of dizziness there may be an occult infarct. No delayed enhancement to suggest subacute ischemia.  No hemorrhage, hydrocephalus, or evidence of mass lesion.  Anterior circulation notable for moderate carotid siphon atherosclerosis with luminal irregularities.  No high-grade stenosis.  No anterior circulation aneurysm.  Posterior circulation positive as above, with left V3 and V4 occlusion and moderate to advanced tandem right V4 stenoses around 1 cm apart.  Dominant left AICA.  Fetal type left PCA, although left P1 is present.  No significant stenosis involving PCA territories.  No aneurysm.   Review of the  MIP images confirms the above findings.  These results were called by telephone on 06/10/2013 at 22 30 to P A Albert, who verbally acknowledged these results.  IMPRESSION:  1.  Left vertebral artery occlusion, as above. No acute infarct is detected, but in the setting of dizziness there may be  small cerebellar infarcts.  Consider MRI correlate. A large left AICA serves the majority of the inferior left cerebellum.  2.  Tandem moderate to advanced intradural right vertebral artery stenoses - presumably atherosclerotic  3.  Moderate carotid siphon atherosclerosis.  4. Large left PCOM artery.   Original Report Authenticated By: Tiburcio Pea   Dg Chest 2 View  06/10/2013   *RADIOLOGY REPORT*  Clinical Data: Syncope and dizziness.  CHEST - 2 VIEW  Comparison: No priors for  Findings: Lung volumes are normal.  No consolidative airspace disease.  No pleural effusions.  No pneumothorax.  No pulmonary nodule or mass noted.  Pulmonary vasculature and the cardiomediastinal silhouette are within normal limits. Atherosclerosis of the thoracic aorta.  IMPRESSION: 1. No radiographic evidence of acute cardiopulmonary disease. 2.  Atherosclerosis.   Original Report Authenticated By: Trudie Reed, M.D.   Ct Angio Neck W/cm &/or Wo/cm  06/10/2013   *RADIOLOGY REPORT*  Clinical Data:  Dizziness.  On and off for 2 weeks.  Left cheek and eye changes for 10 minutes.  CT ANGIOGRAPHY HEAD AND NECK  Technique:  Multidetector CT imaging of the head and neck was performed using the standard protocol during bolus administration of intravenous contrast.  Multiplanar CT image reconstructions including MIPs were obtained to evaluate the vascular anatomy. Carotid stenosis measurements (when applicable) are obtained utilizing NASCET criteria, using the distal internal carotid diameter as the denominator.  Contrast: OMNIPAQUE IOHEXOL 350 MG/ML SOLN  Comparison:   None.  CTA NECK  Findings:  Aortic arch is not imaged. The proximal great vessels are also not included.  Cervical carotid atherosclerosis at the bifurcations, without significant stenosis.  Left vertebral artery occlusion, or effective occlusion, beginning at the V1-V2 junction, continuing nearly all the way to the basilar.  Mild left vertebral artery dominance.   The right vertebral artery is patent, although there is moderate to advanced narrowing just beyond the dural penetration, presumably atherosclerotic.   Review of the MIP images confirms the above findings.  IMPRESSION: 1.  Left vertebral artery occlusion beginning at the proximal V2 segment, continuing just proximal to the vertebral basilar junction.  Likely dominant left AICA, serving the majority of the left inferior cerebellum. 2.  Tandem moderate to advanced right intradural vertebral artery stenoses, presumably atherosclerotic.  3.  Cervical carotid bifurcation atherosclerosis, without significant stenosis. 4.  Aortic arch and proximal great vessels not imaged.  Findings:  Noncontrast portion:  No acute osseous findings.  The imaged paranasal sinuses are essentially clear.  Orbits are unremarkable.  The left vertebral artery is high density, correlating with thrombosis.  No definite acute ischemia in the posterior circulation territories, although in the setting of dizziness there may be an occult infarct. No delayed enhancement to suggest subacute ischemia.  No hemorrhage, hydrocephalus, or evidence of mass lesion.  Anterior circulation notable for moderate carotid siphon atherosclerosis with luminal irregularities.  No high-grade stenosis.  No anterior circulation aneurysm.  Posterior circulation positive as above, with left V3 and V4 occlusion and moderate to advanced tandem right V4 stenoses around 1 cm apart.  Dominant left AICA.  Fetal type left PCA, although left P1 is present.  No significant stenosis  involving PCA territories.  No aneurysm.   Review of the MIP images confirms the above findings.  These results were called by telephone on 06/10/2013 at 22 30 to P A Albert, who verbally acknowledged these results.  IMPRESSION:  1.  Left vertebral artery occlusion, as above. No acute infarct is detected, but in the setting of dizziness there may be small cerebellar infarcts.  Consider MRI correlate. A  large left AICA serves the majority of the inferior left cerebellum.  2.  Tandem moderate to advanced intradural right vertebral artery stenoses - presumably atherosclerotic  3.  Moderate carotid siphon atherosclerosis.  4. Large left PCOM artery.   Original Report Authenticated By: Tiburcio Pea     2-D echo pending  Microbiology: No results found for this or any previous visit (from the past 240 hour(s)).   Labs: Results for orders placed during the hospital encounter of 06/10/13 (from the past 48 hour(s))  CBC     Status: None   Collection Time    06/10/13  8:40 PM      Result Value Range   WBC 7.8  4.0 - 10.5 K/uL   RBC 5.22  4.22 - 5.81 MIL/uL   Hemoglobin 15.6  13.0 - 17.0 g/dL   HCT 40.9  81.1 - 91.4 %   MCV 83.7  78.0 - 100.0 fL   MCH 29.9  26.0 - 34.0 pg   MCHC 35.7  30.0 - 36.0 g/dL   RDW 78.2  95.6 - 21.3 %   Platelets 173  150 - 400 K/uL  BASIC METABOLIC PANEL     Status: Abnormal   Collection Time    06/10/13  8:40 PM      Result Value Range   Sodium 139  135 - 145 mEq/L   Potassium 3.5  3.5 - 5.1 mEq/L   Chloride 103  96 - 112 mEq/L   CO2 24  19 - 32 mEq/L   Glucose, Bld 173 (*) 70 - 99 mg/dL   BUN 14  6 - 23 mg/dL   Creatinine, Ser 0.86  0.50 - 1.35 mg/dL   Calcium 9.2  8.4 - 57.8 mg/dL   GFR calc non Af Amer 89 (*) >90 mL/min   GFR calc Af Amer >90  >90 mL/min   Comment:            The eGFR has been calculated     using the CKD EPI equation.     This calculation has not been     validated in all clinical     situations.     eGFR's persistently     <90 mL/min signify     possible Chronic Kidney Disease.  TROPONIN I     Status: None   Collection Time    06/10/13  8:40 PM      Result Value Range   Troponin I <0.30  <0.30 ng/mL   Comment:            Due to the release kinetics of cTnI,     a negative result within the first hours     of the onset of symptoms does not rule out     myocardial infarction with certainty.     If myocardial infarction  is still suspected,     repeat the test at appropriate intervals.  COMPREHENSIVE METABOLIC PANEL     Status: Abnormal   Collection Time    06/11/13  3:45 AM      Result  Value Range   Sodium 139  135 - 145 mEq/L   Potassium 3.2 (*) 3.5 - 5.1 mEq/L   Chloride 103  96 - 112 mEq/L   CO2 25  19 - 32 mEq/L   Glucose, Bld 143 (*) 70 - 99 mg/dL   BUN 10  6 - 23 mg/dL   Creatinine, Ser 1.61  0.50 - 1.35 mg/dL   Calcium 8.9  8.4 - 09.6 mg/dL   Total Protein 6.5  6.0 - 8.3 g/dL   Albumin 3.8  3.5 - 5.2 g/dL   AST 17  0 - 37 U/L   ALT 11  0 - 53 U/L   Alkaline Phosphatase 67  39 - 117 U/L   Total Bilirubin 0.6  0.3 - 1.2 mg/dL   GFR calc non Af Amer >90  >90 mL/min   GFR calc Af Amer >90  >90 mL/min   Comment:            The eGFR has been calculated     using the CKD EPI equation.     This calculation has not been     validated in all clinical     situations.     eGFR's persistently     <90 mL/min signify     possible Chronic Kidney Disease.  CBC WITH DIFFERENTIAL     Status: Abnormal   Collection Time    06/11/13  3:45 AM      Result Value Range   WBC 7.3  4.0 - 10.5 K/uL   RBC 5.08  4.22 - 5.81 MIL/uL   Hemoglobin 15.1  13.0 - 17.0 g/dL   HCT 04.5  40.9 - 81.1 %   MCV 83.9  78.0 - 100.0 fL   MCH 29.7  26.0 - 34.0 pg   MCHC 35.4  30.0 - 36.0 g/dL   RDW 91.4  78.2 - 95.6 %   Platelets 165  150 - 400 K/uL   Neutrophils Relative % 78 (*) 43 - 77 %   Neutro Abs 5.7  1.7 - 7.7 K/uL   Lymphocytes Relative 15  12 - 46 %   Lymphs Abs 1.1  0.7 - 4.0 K/uL   Monocytes Relative 7  3 - 12 %   Monocytes Absolute 0.5  0.1 - 1.0 K/uL   Eosinophils Relative 0  0 - 5 %   Eosinophils Absolute 0.0  0.0 - 0.7 K/uL   Basophils Relative 0  0 - 1 %   Basophils Absolute 0.0  0.0 - 0.1 K/uL  LIPID PANEL     Status: Abnormal   Collection Time    06/11/13  3:45 AM      Result Value Range   Cholesterol 151  0 - 200 mg/dL   Triglycerides 71  <213 mg/dL   HDL 38 (*) >08 mg/dL   Total CHOL/HDL  Ratio 4.0     VLDL 14  0 - 40 mg/dL   LDL Cholesterol 99  0 - 99 mg/dL   Comment:            Total Cholesterol/HDL:CHD Risk     Coronary Heart Disease Risk Table                         Men   Women      1/2 Average Risk   3.4   3.3      Average Risk       5.0   4.4  2 X Average Risk   9.6   7.1      3 X Average Risk  23.4   11.0                Use the calculated Patient Ratio     above and the CHD Risk Table     to determine the patient's CHD Risk.                ATP III CLASSIFICATION (LDL):      <100     mg/dL   Optimal      161-096  mg/dL   Near or Above                        Optimal      130-159  mg/dL   Borderline      045-409  mg/dL   High      >811     mg/dL   Very High     HPI  67 y.o. male with a history of hypertension and arthritis who has been experiencing recurrent spells of dizziness with vertigo with associated nausea for since 06/08/2013. There was associated numbness of the left side of his face with first episode. He had a severe spell today which made him very unsteady on his feet in addition to having nausea and vomiting. He said no visual changes. There were no speech changes. 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 Left vertebral artery showed hypodensity indicative of thrombosis. No ischemia of the posterior circulation territories was noted. Further areas of occlusion involving left V3 and V4 segments were noted, and moderate to advanced tandem right V4 stenoses are also noted. No high-grade stenosis was noted involving anterior circulation. Patient has been taking aspirin 325 mg per day. NIH stroke score at this point is 0.    HOSPITAL COURSE:  Vertebral artery thrombosis, possible acute left cerebellar infarction CTA of the brain 06/10/2013 1. Left vertebral artery occlusion, as above. No acute infarct is detected, but in the setting of  dizziness there may be small cerebellar infarcts. Consider MRI correlate. A large left AICA serves the majority of the inferior left cerebellum. 2. Tandem moderate to advanced intradural right vertebral artery stenoses - presumably atherosclerotic 3. Moderate carotid siphon atherosclerosis. 4. Large left PCOM artery.  CTA of the neck 06/10/2013 1. Left vertebral artery occlusion beginning at the proximal V2 segment, continuing just proximal to the vertebral basilar junction. Likely dominant left AICA, serving the majority of the left inferior cerebellum. 2. Tandem moderate to advanced right intradural vertebral artery stenoses, presumably atherosclerotic. 3. Cervical carotid bifurcation atherosclerosis, without significant stenosis. 4. Aortic arch and proximal great vessels not imaged.  MRI of the brain Small areas of acute infarction in the right cerebellum, left  posterior lateral medulla, and right splenium of the corpus  callosum.  2D Echocardiogram LV EF: 60% - 65% - Left ventricle: The cavity size was normal. Systolic function was normal. The estimated ejection fraction was in the range of 60% to 65%. Although no diagnostic regional wall motion abnormality was identified, this possibility cannot be completely excluded on the basis of this study. - Mitral valve: Calcified annulus. - Left atrium: The atrium was moderately dilated. - Atrial septum: There was an atrial septal aneurysm.  Carotid Doppler See CT neck  CXR 06/10/2013 1. No radiographic evidence  of acute cardiopulmonary disease. 2. Atherosclerosis.  EKG normal sinus rhythm.  Therapy Recommendations home health but pt wants outpt PT   Hemoglobin A1c 5.5 Lipid panel shows triglycerides of 71, LDL of 99 PT/OT eval Add aspirin 81 mg orally every day to clopidogrel 75 mg orally every day for secondary stroke prevention x 3 months then plavix alone given severe intracranial atherosclerosis.      Discharge Exam:  Blood pressure  148/54, pulse 60, temperature 97.3 F (36.3 C), temperature source Oral, resp. rate 19, weight 92.987 kg (205 lb), SpO2 100.00%.    General: Well-developed well-nourished.  Eyes: Anicteric no pallor.  ENT: No discharge from the ears eyes nose or mouth.  Neck: No mass felt.  Cardiovascular: S1-S2 heard.  Respiratory: No rhonchi crepitations.  Abdomen: Soft nontender bowel sounds present.  Skin: No rash.  Musculoskeletal: No edema.  Psychiatric: Appears normal.  Neurologic: Alert awake oriented to time place and person. Moves all extremities.        SignedRicharda Overlie 06/11/2013, 7:27 AM

## 2013-06-11 NOTE — ED Notes (Signed)
Dr. Julian Reil is the receiving MD at [facility].

## 2013-06-11 NOTE — Consult Note (Signed)
Referring Physician: Dr. Jodi Mourning    Chief Complaint: Recurrent spells of dizziness and vertigo with nausea and unsteady gait.  HPI: Randy Myers is an 67 y.o. male with a history of hypertension and arthritis who has been experiencing recurrent spells of dizziness with vertigo with associated nausea for since 06/08/2013. There was associated numbness of the left side of his face with first episode. He had a severe spell today which made him very unsteady on his feet in addition to having nausea and vomiting. He said no visual changes. There were no speech changes. 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 Left vertebral artery showed hypodensity indicative of thrombosis. No ischemia of the posterior circulation territories was noted. Further areas of occlusion involving left V3 and V4 segments were noted, and moderate to advanced tandem right V4 stenoses are also noted. No high-grade stenosis was noted involving anterior circulation. Patient has been taking aspirin 325 mg per day. NIH stroke score at this point is 0.  LSN: 2 PM 06/10/2013 tPA Given: No: No clear objective deficits mRankin:  Past Medical History  Diagnosis Date  . Hypertension   . Arthritis     Family History  Problem Relation Age of Onset  . CAD Father   . Brain cancer Sister      Medications: I have reviewed the patient's current medications.  ROS: History obtained from the patient  General ROS: negative for - chills, fatigue, fever, night sweats, weight gain or weight loss Psychological ROS: negative for - behavioral disorder, hallucinations, memory difficulties, mood swings or suicidal ideation Ophthalmic ROS: negative for - blurry vision, double vision, eye pain or loss of vision ENT ROS: negative for - epistaxis, nasal discharge, oral lesions, sore throat, tinnitus or vertigo Allergy and Immunology  ROS: negative for - hives or itchy/watery eyes Hematological and Lymphatic ROS: negative for - bleeding problems, bruising or swollen lymph nodes Endocrine ROS: negative for - galactorrhea, hair pattern changes, polydipsia/polyuria or temperature intolerance Respiratory ROS: negative for - cough, hemoptysis, shortness of breath or wheezing Cardiovascular ROS: negative for - chest pain, dyspnea on exertion, edema or irregular heartbeat Gastrointestinal ROS: negative for - abdominal pain, diarrhea, hematemesis, nausea/vomiting or stool incontinence Genito-Urinary ROS: negative for - dysuria, hematuria, incontinence or urinary frequency/urgency Musculoskeletal ROS: negative for - joint swelling or muscular weakness Neurological ROS: as noted in HPI Dermatological ROS: negative for rash and skin lesion changes  Physical Examination: Blood pressure 153/86, pulse 88, temperature 97.9 F (36.6 C), temperature source Oral, resp. rate 18, weight 92.987 kg (205 lb), SpO2 98.00%.  Neurologic Examination: Mental Status: Alert, oriented, thought content appropriate.  Speech fluent without evidence of aphasia. Able to follow commands without difficulty. Cranial Nerves: II-Visual fields were normal. III/IV/VI-Pupils were equal and reacted. Extraocular movements were full and conjugate.    V/VII-no facial numbness and no facial weakness. VIII-normal. X-normal speech and symmetrical palatal movement. XII-midline tongue extension Motor: 5/5 bilaterally with normal tone and bulk Sensory: Normal throughout. Deep Tendon Reflexes: Trace to 1+ and symmetric. Plantars: Mute bilaterally Cerebellar: Normal finger-to-nose testing. Carotid auscultation: Normal  Assessment: 67 y.o. male presenting with recurrent spells of vertigo with transient facial numbness on one occasion, and unsteady gait, possibly TIAs. CTA showed findings indicative of vertebral artery thrombosis. Acute left cerebellar infarction cannot  be ruled out.  Stroke Risk Factors - hypertension  Plan: 1. HgbA1c, fasting lipid panel 2.  MRI of the brain without contrast 3. PT consult, OT consult, Speech consult 4. Echocardiogram 5. Prophylactic therapy-Antiplatelet med: Plavix 75 mg per day 6. Risk factor modification 7. Telemetry monitoring   C.R. Roseanne Reno, MD [facility] [phone] -- [phone]  06/11/2013, 1:53 AM

## 2013-06-11 NOTE — Progress Notes (Signed)
Stroke Team Progress Note  HISTORY Randy Myers is an 67 y.o. male with a history of hypertension and arthritis who has been experiencing recurrent spells of dizziness with vertigo with associated nausea since 06/08/2013. There was associated numbness of the left side of his face with first episode. He had a severe spell today 06/10/2013 at 1400  which made him very unsteady on his feet in addition to having nausea and vomiting. He had no visual changes. There were no speech changes. 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 Left vertebral artery showed hypodensity indicative of thrombosis. No ischemia of the posterior circulation territories was noted. Further areas of occlusion involving left V3 and V4 segments were noted, and moderate to advanced tandem right V4 stenoses are also noted. No high-grade stenosis was noted involving anterior circulation. Patient has been taking aspirin 325 mg per day. NIH stroke score at this point is 0.  Patient was not a TPA candidate secondary to delay in arrival and no clear objective neuro deficits. He was admitted for further evaluation and treatment.  SUBJECTIVE No family is at the bedside.  Overall he feels his condition is stable. He denies double vision.   OBJECTIVE Most recent Vital Signs: Filed Vitals:   06/11/13 0128 06/11/13 0208 06/11/13 0240 06/11/13 0545  BP:  142/81 152/64 148/54  Pulse:  79 76 60  Temp: 97.9 F (36.6 C) 98.7 F (37.1 C) 97.8 F (36.6 C) 97.3 F (36.3 C)  TempSrc:  Oral Oral Oral  Resp:  18 20 19   Weight:      SpO2:  98% 100% 100%   CBG (last 3)  No results found for this basename: GLUCAP,  in the last 72 hours  IV Fluid Intake:   . sodium chloride 75 mL/hr at 06/11/13 0331    MEDICATIONS  . amLODipine  5 mg Oral Daily  . clopidogrel  75 mg Oral Q breakfast  . enoxaparin (LOVENOX) injection  40 mg  Subcutaneous Q24H  . multivitamin with minerals  1 tablet Oral Daily   PRN:  ondansetron (ZOFRAN) IV, promethazine, senna-docusate  Diet:  Cardiac thin liquids Activity:  OOB with assistance DVT Prophylaxis:  Lovenox 40 mg sq daily   CLINICALLY SIGNIFICANT STUDIES Basic Metabolic Panel:  Recent Labs Lab 06/10/13 2040 06/11/13 0345  NA 139 139  K 3.5 3.2*  CL 103 103  CO2 24 25  GLUCOSE 173* 143*  BUN 14 10  CREATININE 0.82 0.71  CALCIUM 9.2 8.9   Liver Function Tests:  Recent Labs Lab 06/11/13 0345  AST 17  ALT 11  ALKPHOS 67  BILITOT 0.6  PROT 6.5  ALBUMIN 3.8   CBC:  Recent Labs Lab 06/10/13 2040 06/11/13 0345  WBC 7.8 7.3  NEUTROABS  --  5.7  HGB 15.6 15.1  HCT 43.7 42.6  MCV 83.7 83.9  PLT 173 165   Coagulation: No results found for this basename: LABPROT, INR,  in the last 168 hours Cardiac Enzymes:  Recent Labs Lab 06/10/13 2040  TROPONINI <0.30   Urinalysis: No results found for this basename: COLORURINE, APPERANCEUR, LABSPEC, PHURINE, GLUCOSEU, HGBUR, BILIRUBINUR, KETONESUR, PROTEINUR, UROBILINOGEN, NITRITE, LEUKOCYTESUR,  in the last 168 hours Lipid Panel    Component Value Date/Time   CHOL 151 06/11/2013 0345   TRIG 71 06/11/2013 0345   HDL 38* 06/11/2013 0345   CHOLHDL 4.0 06/11/2013 0345   VLDL  14 06/11/2013 0345   LDLCALC 99 06/11/2013 0345   HgbA1C  No results found for this basename: HGBA1C    Urine Drug Screen:   No results found for this basename: labopia, cocainscrnur, labbenz, amphetmu, thcu, labbarb    Alcohol Level: No results found for this basename: ETH,  in the last 168 hours  CTA of the brain  06/10/2013     1.  Left vertebral artery occlusion, as above. No acute infarct is detected, but in the setting of dizziness there may be small cerebellar infarcts.  Consider MRI correlate. A large left AICA serves the majority of the inferior left cerebellum.  2.  Tandem moderate to advanced intradural right vertebral artery stenoses -  presumably atherosclerotic  3.  Moderate carotid siphon atherosclerosis.  4. Large left PCOM artery.    CTA of the neck  06/10/2013   1.  Left vertebral artery occlusion beginning at the proximal V2 segment, continuing just proximal to the vertebral basilar junction.  Likely dominant left AICA, serving the majority of the left inferior cerebellum. 2.  Tandem moderate to advanced right intradural vertebral artery stenoses, presumably atherosclerotic.  3.  Cervical carotid bifurcation atherosclerosis, without significant stenosis. 4.  Aortic arch and proximal great vessels not imaged.    MRI of the brain    2D Echocardiogram    Carotid Doppler  See CT neck  CXR  06/10/2013  1. No radiographic evidence of acute cardiopulmonary disease. 2.  Atherosclerosis.    EKG  normal sinus rhythm.   Therapy Recommendations   Physical Exam   Frail middle aged Caucasian male not in distress.Awake alert. Afebrile. Head is nontraumatic. Neck is supple without bruit. Hearing is normal. Cardiac exam no murmur or gallop. Lungs are clear to auscultation. Distal pulses are well felt. Neurological Exam :  Awake  Alert oriented x 3. Normal speech and language.eye movements full without nystagmus.fundi were not visualized. Vision acuity and fields appear normal. Hearing is normal. Palatal movements are normal. Face symmetric. Tongue midline. Normal strength, tone, reflexes and coordination. Normal sensation. Gait deferred. ASSESSMENT Mr. Randy Myers is a 67 y.o. male presenting with recurrent spells of dizziness and vertigo with nausea and unsteady gait.  Initial imaging (CT) does not confirm an acute  infarct. Suspect posterior circulation infarct with infarct likley thrombotic secondary to severe posterior circulation atherosclerotic disease - L VA occluded, R VA with stenosis.  On aspirin 325 mg orally every day prior to admission. Now on aspirin 325 mg orally every day and clopidogrel 75 mg orally every day for  secondary stroke prevention. Patient with no resultant neuro deficits found during exam. Work up underway.  Hypertension LDL 99 HgbA1c pending  Hospital day # 1  TREATMENT/PLAN  Add  aspirin 81 mg orally every day to clopidogrel 75 mg orally every day for secondary stroke prevention x 3 months then plavix alone given severe intracranial atherosclerosis.  F/u MRI, 2D echo, HgbA1c  OOB. Therapy evals  Annie Main, MSN, RN, ANVP-BC, ANP-BC, GNP-BC [facility] Pager: [phone] 06/11/2013 11:13 AM  I have personally obtained a history, examined the patient, evaluated imaging results, and formulated the assessment and plan of care. I agree with the above. Delia Heady, MD

## 2013-06-11 NOTE — Evaluation (Addendum)
Occupational Therapy Evaluation Patient Details Name: Randy Myers MRN: [id number] DOB: 1946/08/08 Today's Date: 06/11/2013 Time: 4098-1191 OT Time Calculation (min): 41 min  OT Assessment / Plan / Recommendation History of present illness Randy Myers is an 67 y.o. male with a history of hypertension and arthritis who has been experiencing recurrent spells of dizziness with vertigo with associated nausea for since 06/08/2013. There was associated numbness of the left side of his face with first episode. He had a severe spell today which made him very unsteady on his feet in addition to having nausea and vomiting. He said no visual changes. There were no speech changes. 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 Left vertebral artery showed hypodensity indicative of thrombosis. No ischemia of the posterior circulation territories was noted. Further areas of occlusion involving left V3 and V4 segments were noted, and moderate to advanced tandem right V4 stenoses are also noted. No high-grade stenosis was noted involving anterior circulation. Patient has been taking aspirin 325 mg per day. NIH stroke score at this point is 0.   Clinical Impression   Pt presents to OT with questionable cerebellar infarct.  He demonstrates nystagmus, mild dysmetria, and truncal ataxia with visual exercises.  He requires min A for balance and functional mobility, and ADLs with a RW at time.  He reports that family can provide 24 hour assist at discharge.  Long conversation with him re: risk of falling, and need for assist when he is up.  He verbalizes understanding.  Recommend OPOT at discharge, unless he is unable to safely negotiate steps (he has 5 to enter/exit his house)    OT Assessment  Patient needs continued OT Services    Follow Up Recommendations  Home Health OT;Supervision/Assistance - 24 hour     Barriers to Discharge      Equipment Recommendations  Tub/shower bench    Recommendations for Other Services    Frequency  Min 2X/week    Precautions / Restrictions Precautions Precautions: Fall   Pertinent Vitals/Pain     ADL  Eating/Feeding: Modified independent Where Assessed - Eating/Feeding: Bed level Grooming: Wash/dry hands;Wash/dry face;Teeth care;Minimal assistance Where Assessed - Grooming: Supported standing Upper Body Bathing: Supervision/safety Where Assessed - Upper Body Bathing: Unsupported sitting Lower Body Bathing: Minimal assistance Where Assessed - Lower Body Bathing: Supported sit to stand Upper Body Dressing: Set up;Supervision/safety Where Assessed - Upper Body Dressing: Unsupported sitting Lower Body Dressing: Minimal assistance Where Assessed - Lower Body Dressing: Supported sit to stand Toilet Transfer: Minimal assistance Toilet Transfer Method: Sit to stand;Stand pivot Acupuncturist: Comfort height toilet;Grab bars Toileting - Clothing Manipulation and Hygiene: Minimal assistance Where Assessed - Engineer, mining and Hygiene: Standing Equipment Used: Rolling walker Transfers/Ambulation Related to ADLs: Min A with RW.  Pt leans to Lt requiring assist to correct ADL Comments: Pt initially leaning to Lt. when donning socks, but later in session, this is much less apparent when doffing socks.  Long discussion re: fall risk and safety for discharge.  Recommending 24 hour assist, and reinforced with him the need for someone to be with him at all times when he is ambulating.  He verbally agrees    OT Diagnosis: Generalized weakness;Ataxia;Disturbance of vision  OT Problem List: Decreased strength;Impaired balance (sitting and/or standing);Impaired vision/perception;Decreased coordination;Decreased knowledge of use of DME or AE OT Treatment Interventions: Self-care/ADL training;Neuromuscular education;DME and/or AE  instruction;Therapeutic  activities;Visual/perceptual remediation/compensation;Patient/family education;Balance training   OT Goals(Current goals can be found in the care plan section) Acute Rehab OT Goals Patient Stated Goal: To go home OT Goal Formulation: With patient Time For Goal Achievement: 06/18/13 Potential to Achieve Goals: Good ADL Goals Pt Will Perform Grooming: with min guard assist;standing Pt Will Perform Lower Body Bathing: with min guard assist;sit to/from stand Pt Will Perform Lower Body Dressing: with min guard assist;sit to/from stand Pt Will Transfer to Toilet: with min guard assist;ambulating;regular height toilet;grab bars Pt Will Perform Toileting - Clothing Manipulation and hygiene: with min guard assist;sit to/from stand Pt Will Perform Tub/Shower Transfer: with min guard assist;ambulating;tub bench;rolling walker  Visit Information  Last OT Received On: 06/11/13 Assistance Needed: +1 History of Present Illness: Randy Myers is an 67 y.o. male with a history of hypertension and arthritis who has been experiencing recurrent spells of dizziness with vertigo with associated nausea for since 06/08/2013. There was associated numbness of the left side of his face with first episode. He had a severe spell today which made him very unsteady on his feet in addition to having nausea and vomiting. He said no visual changes. There were no speech changes. 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 Left vertebral artery showed hypodensity indicative of thrombosis. No ischemia of the posterior circulation territories was noted. Further areas of occlusion involving left V3 and V4 segments were noted, and moderate to advanced tandem right V4 stenoses are also noted. No high-grade stenosis was noted involving anterior circulation. Patient has been taking aspirin 325 mg per day.  NIH stroke score at this point is 0.       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  Questionable oscillopsia noted OS Perception Perception: Within Functional Limits Praxis Praxis: Intact   Cognition  Cognition Arousal/Alertness: Awake/alert Behavior During Therapy: WFL for tasks assessed/performed Overall Cognitive Status: Within Functional Limits for tasks assessed    Extremity/Trunk Assessment Upper Extremity Assessment Upper Extremity Assessment: LUE deficits/detail LUE Deficits / Details: very slight dysmetria Lt. UE Cervical / Trunk Assessment Cervical / Trunk Assessment: Kyphotic     Mobility Bed Mobility Bed Mobility: Supine to Sit;Sitting - Scoot to Edge of Bed;Sit to Supine Supine to Sit: 5: Supervision Sitting - Scoot to Edge of Bed: 5: Supervision Sit to Supine: 5: Supervision Details for Bed Mobility Assistance: Pt denies dizziness.  Just reports his  head feels heavy Transfers Transfers: Sit to Stand;Stand to Sit Sit to Stand: 4: Min assist;With upper extremity assist;From bed;From toilet Stand to Sit: 4: Min assist;With upper extremity assist;To bed;To toilet Details for  Transfer Assistance: Pt requires min A for balance     Exercise     Balance Balance Balance Assessed: Yes Static Standing Balance Static Standing - Balance Support: No upper extremity supported Static Standing - Level of Assistance: 4: Min assist Static Standing - Comment/# of Minutes: Occulomotor pursuits tested in standing, pt with increased sway requiring min A to maintain balance   End of Session OT - End of Session Equipment Utilized During Treatment: Rolling walker Activity Tolerance: Patient tolerated treatment well Patient left: in bed;with call bell/phone within reach;with bed alarm set Nurse Communication: Mobility status  GO     Randy Myers 06/11/2013, 11:25 AM

## 2013-06-11 NOTE — ED Provider Notes (Signed)
Medical screening examination/treatment/procedure(s) were conducted as a shared visit with non-physician practitioner(s) or resident and myself. I personally evaluated the patient during the encounter and agree with the findings and plan unless otherwise indicated.  Concern for stroke or dissection with balance difficulty, left facial numbness, vision changes and vertigo.  CT with contrast confirmed suspicion. Fluids and asa in ED. No changes during ED visit.  Except gait, normal neuro exam in ED. RRR.  Non toxic appearing, no distress.  EKG reviewed, no acute findings. Vertebral occlusion/ Cerebellar Stroke confirmed on CTA, pt will need MRI inpatient and stroke care.  ASA given.   Admitted.   Enid Skeens, MD 06/11/13 [phone]

## 2013-06-11 NOTE — Progress Notes (Signed)
Occupational Therapy Treatment Patient Details Name: SHANNA STRENGTH MRN: [id number] DOB: 1945/11/10 Today's Date: 06/11/2013 Time:  -     OT Assessment / Plan / Recommendation  History of present illness CHANDON LAZCANO is an 67 y.o. male with a history of hypertension and arthritis who has been experiencing recurrent spells of dizziness with vertigo with associated nausea for since 06/08/2013. There was associated numbness of the left side of his face with first episode. He had a severe spell today which made him very unsteady on his feet in addition to having nausea and vomiting. He said no visual changes. There were no speech changes. 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 Left vertebral artery showed hypodensity indicative of thrombosis. No ischemia of the posterior circulation territories was noted. Further areas of occlusion involving left V3 and V4 segments were noted, and moderate to advanced tandem right V4 stenoses are also noted. No high-grade stenosis was noted involving anterior circulation.   OT comments  Pt's wife present this pm for session.  Education completed with her in regards to safely assisting pt with functional mobility.  She is safely able to assist pt and feels very confident about her ability to assist/manage him at home.  She reports sone will also assist as needed.  They also would prefer OPOT and OPPT due to remodeling in their home.  They both feel that wife and son can safely assist him in and out of the house.   Follow Up Recommendations  Outpatient OT;Supervision/Assistance - 24 hour    Barriers to Discharge       Equipment Recommendations  None recommended by OT    Recommendations for Other Services    Frequency Min 2X/week   Progress towards OT Goals Progress towards OT goals: Progressing toward goals  Plan Discharge plan needs to be updated     Precautions / Restrictions Precautions Precautions: Fall Restrictions Weight Bearing Restrictions: No   Pertinent Vitals/Pain     ADL  Equipment Used: Rolling walker Transfers/Ambulation Related to ADLs: Min A with RW.  Pt leans to Lt requiring assist to correct ADL Comments: Education completed with wife.  She was able to safely assit him with ambulation and standing activities.  Wife reports she or son will be with him all the time, and states she feels very comfortable with his current status and with taking him home at this level.  Discussed options for tub seats and transfers;however, they have decided that he will initially sponge bathe and will talk to OP therapists about tub DME if it is needed.  They also want to go to OP for therapy as their in the middle of remodelling.  WIfe states she feels confident that she and son can asist him in and out of the house (she states he was 10x worse yesterday when she was assisting hime).       OT Diagnosis:    OT Problem List:   OT Treatment Interventions:     OT Goals(current goals can now be found in the care plan section) Acute Rehab OT Goals Patient Stated Goal: To go home ADL Goals Pt Will Perform Grooming: with min guard assist;standing Pt Will Perform Lower Body Bathing: with min guard assist;sit to/from stand Pt Will Perform Lower Body Dressing: with min guard assist;sit to/from stand Pt Will Transfer to Toilet: with min guard assist;ambulating;regular height toilet;grab bars Pt Will  Perform Toileting - Clothing Manipulation and hygiene: with min guard assist;sit to/from stand Pt Will Perform Tub/Shower Transfer: with min guard assist;ambulating;tub bench;rolling walker  Visit Information  Assistance Needed: +1 History of Present Illness: ANTION ANDRES is an 67 y.o. male with a history of hypertension and arthritis who has been experiencing recurrent spells of dizziness with vertigo with associated nausea for since  06/08/2013. There was associated numbness of the left side of his face with first episode. He had a severe spell today which made him very unsteady on his feet in addition to having nausea and vomiting. He said no visual changes. There were no speech changes. 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 Left vertebral artery showed hypodensity indicative of thrombosis. No ischemia of the posterior circulation territories was noted. Further areas of occlusion involving left V3 and V4 segments were noted, and moderate to advanced tandem right V4 stenoses are also noted. No high-grade stenosis was noted involving anterior circulation.    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  Supervision for safety Transfers Transfers: Sit to Stand;Stand to Sit Sit to Stand: 4:  Min guard;With upper extremity assist;From bed Stand to Sit: 4: Min guard;With upper extremity assist;To bed Details for Transfer Assistance: Verbal cues for hand placement and to move more slowly.  Required min assist for balance and safety.     Exercises      Balance Balance Balance Assessed: Yes Static Standing Balance Static Standing - Balance Support: No upper extremity supported Static Standing - Level of Assistance: Other (comment) (min guard assist) Static Standing - Comment/# of Minutes: Pt able to adjust pants with min guard assist Dynamic Standing Balance Dynamic Standing - Balance Support: Bilateral upper extremity supported Dynamic Standing - Level of Assistance: 3: Mod assist Dynamic Standing - Balance Activities: Lateral lean/weight shifting;Forward lean/weight shifting;Reaching across midline (Weight shift with stepping) Dynamic Standing - Comments: Required mod assist to prevent fall during dynamic activities, even with patient using bil UE's for support.   End of Session OT - End of Session Equipment Utilized During Treatment: Rolling walker Activity Tolerance: Patient tolerated treatment well Patient left: in bed;with call bell/phone within reach;with bed alarm set Nurse Communication: Mobility status  GO     Reynaldo Rossman M 06/11/2013, 5:00 PM

## 2013-06-11 NOTE — Progress Notes (Signed)
   CARE MANAGEMENT NOTE 06/11/2013  Patient:  Randy Myers, Randy Myers   Account Number:  [id number]  Date Initiated:  06/11/2013  Documentation initiated by:  Jiles Crocker  Subjective/Objective Assessment:   ADMITTED WITH TIA     Action/Plan:   [facility]; CM FOLLOWING FOR DCP   Anticipated DC Date:  06/15/2013   Anticipated DC Plan:  HOME/SELF CARE VS HOME HEALTH CARE; AWAITING ON PT/OT EVALS      DC Planning Services  CM consult         Status of service:  In process, will continue to follow Medicare Important Message given?  NA - LOS <3 / Initial given by admissions (If response is "NO", the following Medicare IM given date fields will be blank)  Per UR Regulation:  Reviewed for med. necessity/level of care/duration of stay  Comments:  06/11/2013- B Layliana Devins RN,BSN,MHA

## 2013-06-12 NOTE — Progress Notes (Signed)
Cm received call on 06/11/13 at 1800 that pt was being discharged and needed outpt PT arranged. Pt wanted an outpt facility near his home in [place]. Cm explained to RN that Cm would be unable to arrange outpt PT at this time. CM advised RN to have NCM on the unit arrange the outpt PT on 06/12/13 and call pt with arrangements since facilities would be closed at that time in the evening.

## 2013-06-12 NOTE — Care Management Note (Signed)
    Page 1 of 1   06/12/2013     11:04:42 AM   CARE MANAGEMENT NOTE 06/12/2013  Patient:  Randy Myers, Randy Myers   Account Number:  [id number]  Date Initiated:  06/11/2013  Documentation initiated by:  Jiles Crocker  Subjective/Objective Assessment:   ADMITTED WITH TIA     Action/Plan:   [facility]; CM FOLLOWING FOR DCP   Anticipated DC Date:  06/15/2013   Anticipated DC Plan:  HOME/SELF CARE      DC Planning Services  CM consult  OP Neuro Rehab      Choice offered to / List presented to:             Status of service:  Completed, signed off Medicare Important Message given?  NA - LOS <3 / Initial given by admissions (If response is "NO", the following Medicare IM given date fields will be blank) Date Medicare IM given:   Date Additional Medicare IM given:    Discharge Disposition:  HOME/SELF CARE  Per UR Regulation:  Reviewed for med. necessity/level of care/duration of stay  If discussed at Long Length of Stay Meetings, dates discussed:    Comments:  06/12/13 1045 Elmer Bales RN, MSN, CM- Spoke with patient via phone to confirm that he is interested in outpatient neuro rehab.  Address and phone number for neuro rehab were provided.  Information was faxed out.   06/11/2013- B CHANDLER RN,BSN,MHA

## 2013-06-19 ENCOUNTER — Ambulatory Visit: Payer: Medicare HMO | Admitting: Occupational Therapy

## 2013-06-19 ENCOUNTER — Ambulatory Visit: Payer: Medicare HMO | Attending: Internal Medicine

## 2013-06-19 DIAGNOSIS — Z5189 Encounter for other specified aftercare: Secondary | ICD-10-CM | POA: Insufficient documentation

## 2013-06-19 DIAGNOSIS — I69998 Other sequelae following unspecified cerebrovascular disease: Secondary | ICD-10-CM | POA: Insufficient documentation

## 2013-06-19 DIAGNOSIS — R5381 Other malaise: Secondary | ICD-10-CM | POA: Insufficient documentation

## 2013-06-19 DIAGNOSIS — R262 Difficulty in walking, not elsewhere classified: Secondary | ICD-10-CM | POA: Insufficient documentation

## 2013-06-19 DIAGNOSIS — R279 Unspecified lack of coordination: Secondary | ICD-10-CM | POA: Insufficient documentation

## 2013-06-20 ENCOUNTER — Telehealth: Payer: Self-pay | Admitting: Neurology

## 2013-07-04 ENCOUNTER — Ambulatory Visit: Payer: Medicare HMO | Attending: Neurology | Admitting: Physical Therapy

## 2013-07-04 DIAGNOSIS — I69998 Other sequelae following unspecified cerebrovascular disease: Secondary | ICD-10-CM | POA: Insufficient documentation

## 2013-07-04 DIAGNOSIS — R262 Difficulty in walking, not elsewhere classified: Secondary | ICD-10-CM | POA: Insufficient documentation

## 2013-07-04 DIAGNOSIS — R5381 Other malaise: Secondary | ICD-10-CM | POA: Insufficient documentation

## 2013-07-04 DIAGNOSIS — Z5189 Encounter for other specified aftercare: Secondary | ICD-10-CM | POA: Insufficient documentation

## 2013-07-04 DIAGNOSIS — R279 Unspecified lack of coordination: Secondary | ICD-10-CM | POA: Insufficient documentation

## 2013-07-05 ENCOUNTER — Ambulatory Visit: Payer: Medicare HMO | Admitting: Physical Therapy

## 2013-07-09 ENCOUNTER — Ambulatory Visit: Payer: Medicare HMO

## 2013-07-11 ENCOUNTER — Ambulatory Visit: Payer: Medicare HMO | Admitting: Physical Therapy

## 2013-07-16 ENCOUNTER — Ambulatory Visit: Payer: Medicare HMO

## 2013-07-18 ENCOUNTER — Ambulatory Visit: Payer: Medicare HMO

## 2013-07-23 ENCOUNTER — Ambulatory Visit: Payer: Medicare HMO | Admitting: Physical Therapy

## 2013-07-25 ENCOUNTER — Ambulatory Visit: Payer: Medicare HMO

## 2013-08-27 ENCOUNTER — Telehealth: Payer: Self-pay | Admitting: Nurse Practitioner

## 2013-08-27 NOTE — Telephone Encounter (Signed)
Called Patient to remind him of and appointment 08-29-13,3:00 With Heide Guile, (no answer)

## 2013-08-29 ENCOUNTER — Encounter: Payer: Self-pay | Admitting: Nurse Practitioner

## 2013-08-29 ENCOUNTER — Ambulatory Visit (INDEPENDENT_AMBULATORY_CARE_PROVIDER_SITE_OTHER): Payer: 59 | Admitting: Nurse Practitioner

## 2013-08-29 VITALS — BP 124/68 | HR 72 | Temp 98.3°F | Ht 73.0 in | Wt 210.0 lb

## 2013-08-29 DIAGNOSIS — I6350 Cerebral infarction due to unspecified occlusion or stenosis of unspecified cerebral artery: Secondary | ICD-10-CM

## 2013-08-29 DIAGNOSIS — I6502 Occlusion and stenosis of left vertebral artery: Secondary | ICD-10-CM

## 2013-08-29 DIAGNOSIS — I6509 Occlusion and stenosis of unspecified vertebral artery: Secondary | ICD-10-CM

## 2013-08-29 MED ORDER — ATORVASTATIN CALCIUM 10 MG PO TABS: 10.0000 mg | ORAL_TABLET | Freq: Every day | ORAL | Status: DC

## 2013-08-29 NOTE — Patient Instructions (Signed)
Continue clopidogrel 75 mg orally every day  for secondary stroke prevention and maintain strict control of hypertension with blood pressure goal below 130/90, diabetes with hemoglobin A1c goal below 6.5% and lipids with LDL cholesterol goal below 100 mg/dL.   Reduce Lipitor to 10 mg daily. Rx given. We will order Transcranial Doppler study at next visit. Followup in 3 months.

## 2013-08-29 NOTE — Progress Notes (Deleted)
Subjective:    Patient ID: Randy Myers is a 67 y.o. male.  HPI {Common ambulatory SmartLinks:19316}  Review of Systems  Constitutional: Negative.   HENT: Positive for trouble swallowing.   Eyes: Positive for pain.  Respiratory: Negative.   Cardiovascular: Negative.   Gastrointestinal: Negative.   Endocrine: Positive for heat intolerance.  Genitourinary: Negative.   Musculoskeletal: Positive for arthralgias and myalgias.       Cramps  Skin: Negative.   Allergic/Immunologic: Negative.   Neurological: Positive for numbness and headaches.       Restless leg  Hematological: Bruises/bleeds easily.  Psychiatric/Behavioral: Negative.     Objective:  Neurologic Exam  Physical Exam  Assessment:   ***  Plan:   ***

## 2013-08-29 NOTE — Progress Notes (Signed)
[facility]  PATIENT: Randy Myers DOB: 1946-01-18   HISTORY FROM: patient, chart REASON FOR VISIT: routine follow up  HISTORY OF PRESENT ILLNESS:  Randy Myers is an 67 y.o. male with a history of hypertension and arthritis who has been experiencing recurrent spells of dizziness with vertigo with associated nausea since 06/08/2013. There was associated numbness of the left side of his face with first episode. He had a severe spell on 06/10/2013 at 1400 which made him very unsteady on his feet in addition to having nausea and vomiting. He had no visual changes. There were no speech changes. 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 Left vertebral artery showed hypodensity indicative of thrombosis. No ischemia of the posterior circulation territories was noted. Further areas of occlusion involving left V3 and V4 segments were noted, and moderate to advanced tandem right V4 stenoses are also noted. No high-grade stenosis was noted involving anterior circulation. MRI showed 2 areas of infarction in the cerebellum.  Patient had been taking aspirin 325 mg per day. NIH stroke score at this point is 0.  Patient was not a TPA candidate secondary to delay in arrival and no clear objective neuro deficits. He did not need PT after discharge.  UPDATE 08/29/13 (LL): Randy Myers comes for post-stroke hospital follow up.  He has been well, no further episodes of vertigo.  He noticed after he went home after discharge that he could not feel temperature differences on his right side; especially the right arm.  He has sensory deficit on the left side of his face as well.  He denies any weakness.  His BP is well controlled, and his LDL in hospital was 99, discharged on atorvastatin 40 mg.  He is taking Plavix and aspirin daily since discharge. Patient denies medication side effects, with  no signs of bleeding or excessive bruising.    REVIEW OF SYSTEMS: Full 14 system review of systems performed and notable only for: endocrine: feeling hot Eyes: eye pain  ear/nose/throat: trouble swallowing Hematology/Lymph: easy bruising musculoskeletal: joint pain, cramps, aching muscles neurological: headache, numbness, difficulty swallowing sleep: restless legs  ALLERGIES: No Known Allergies  HOME MEDICATIONS: Outpatient Prescriptions Prior to Visit  Medication Sig Dispense Refill  . amLODipine (NORVASC) 10 MG tablet Take 5 mg by mouth daily.      Marland Kitchen aspirin EC 81 MG EC tablet Take 1 tablet (81 mg total) by mouth daily.  120 tablet  2  . atorvastatin (LIPITOR) 40 MG tablet Take 1 tablet (40 mg total) by mouth daily.  30 tablet  2  . clopidogrel (PLAVIX) 75 MG tablet Take 1 tablet (75 mg total) by mouth daily with breakfast.  30 tablet  3  . Multiple Vitamin (MULTIVITAMIN WITH MINERALS) TABS tablet Take 1 tablet by mouth daily. One a Day       No facility-administered medications prior to visit.    PAST MEDICAL HISTORY: Past Medical History  Diagnosis Date  . Hypertension   . Arthritis   . Stroke 06/10/2013  . Migraine     PAST SURGICAL HISTORY: Past Surgical History  Procedure Laterality Date  . Back surgery  1983    FAMILY HISTORY: Family History  Problem Relation Age of Onset  . CAD Father   . Brain cancer Sister   . Cancer Father     Prostate  . Heart disease Father  SOCIAL HISTORY: History   Social History  . Marital Status: Married    Spouse Name: N/A    Number of Children: N/A  . Years of Education: N/A   Occupational History  . Not on file.   Social History Main Topics  . Smoking status: Never Smoker   . Smokeless tobacco: Not on file  . Alcohol Use: No  . Drug Use: No  . Sexual Activity: Not on file   Other Topics Concern  . Not on file   Social History Narrative  . No narrative on file     PHYSICAL EXAM  Filed Vitals:    08/29/13 1528  BP: 124/68  Pulse: 72  Temp: 98.3 F (36.8 C)  TempSrc: Oral  Height: 6\' 1"  (1.854 m)  Weight: 210 lb (95.255 kg)   Body mass index is 27.71 kg/(m^2).  Generalized: In no acute distress  Neck: Supple, no carotid bruits  Cardiac: Regular rate rhythm, no murmur  Pulmonary: Clear to auscultation bilaterally  Musculoskeletal: No deformity   Neurological examination  Mentation: Alert oriented to time, place, history taking, language fluent, and casual conversation Cranial nerve II-XII: Pupils were equal round reactive to light extraocular movements were full, visual field were full on confrontational test. facial sensation and strength were normal. hearing was intact to finger rubbing bilaterally. Uvula tongue midline. head turning and shoulder shrug and were normal and symmetric.Tongue protrusion into cheek strength was normal. MOTOR: normal bulk and tone, full strength in the BUE, BLE, fine finger movements normal, no pronator drift SENSORY: increased to light touch, and pinprick on left side of face, temperature sense absent on right side. COORDINATION: finger-nose-finger, heel-to-shin bilaterally, there was no truncal ataxia REFLEXES: Brachioradialis 2/2, biceps 2/2, triceps 2/2, patellar 2/2, Achilles 2/2, plantar responses were flexor bilaterally. GAIT/STATION: Rising up from seated position without assistance, stooped stance, without trunk ataxia, moderate stride, good arm swing, smooth turning, able to perform tiptoe, and heel walking and tandem without difficulty. Romberg negative.   DIAGNOSTIC DATA (LABS, IMAGING, TESTING) - I reviewed patient records, labs, notes, testing and imaging myself where available.  Lab Results  Component Value Date   WBC 7.3 06/11/2013   HGB 15.1 06/11/2013   HCT 42.6 06/11/2013   MCV 83.9 06/11/2013   PLT 165 06/11/2013      Component Value Date/Time   NA 139 06/11/2013 0345   K 3.2* 06/11/2013 0345   CL 103 06/11/2013 0345   CO2 25  06/11/2013 0345   GLUCOSE 143* 06/11/2013 0345   BUN 10 06/11/2013 0345   CREATININE 0.71 06/11/2013 0345   CALCIUM 8.9 06/11/2013 0345   PROT 6.5 06/11/2013 0345   ALBUMIN 3.8 06/11/2013 0345   AST 17 06/11/2013 0345   ALT 11 06/11/2013 0345   ALKPHOS 67 06/11/2013 0345   BILITOT 0.6 06/11/2013 0345   GFRNONAA >90 06/11/2013 0345   GFRAA >90 06/11/2013 0345   Lab Results  Component Value Date   CHOL 151 06/11/2013   HDL 38* 06/11/2013   LDLCALC 99 06/11/2013   TRIG 71 06/11/2013   CHOLHDL 4.0 06/11/2013   Lab Results  Component Value Date   HGBA1C 5.5 06/11/2013   ASSESSMENT AND PLAN Randy Myers is a 67 y.o. male presenting with recurrent spells of dizziness and vertigo with nausea and unsteady gait. MRI confirmed small acute cerebellar infarcts.  Infarct likley thrombotic secondary to severe posterior circulation atherosclerotic disease - L VA occluded, R VA with stenosis. Patient with resultant right  hemisensory deficits.  Continue clopidogrel 75 mg orally every day  for secondary stroke prevention and maintain strict control of hypertension with blood pressure goal below 130/90, diabetes with hemoglobin A1c goal below 6.5% and lipids with LDL cholesterol goal below 100 mg/dL.  Reduce Lipitor to 10 mg daily. We will order TCD at next visit. Followup in 3 months.  Udell Blasingame, MSN, NP-C 08/29/2013, 4:22 PM [facility] [address] [place], [place] [address] [phone]  I have personally examined this patient, reviewed pertinent data, developed plan of care and discussed with patient and agree with above.  Delia Heady, MD

## 2013-11-30 ENCOUNTER — Encounter: Payer: Self-pay | Admitting: Nurse Practitioner

## 2013-11-30 ENCOUNTER — Ambulatory Visit (INDEPENDENT_AMBULATORY_CARE_PROVIDER_SITE_OTHER): Payer: Medicare HMO | Admitting: Nurse Practitioner

## 2013-11-30 ENCOUNTER — Encounter (INDEPENDENT_AMBULATORY_CARE_PROVIDER_SITE_OTHER): Payer: Self-pay

## 2013-11-30 VITALS — BP 136/84 | HR 40 | Temp 97.8°F | Ht 73.0 in | Wt 213.0 lb

## 2013-11-30 DIAGNOSIS — I6350 Cerebral infarction due to unspecified occlusion or stenosis of unspecified cerebral artery: Secondary | ICD-10-CM

## 2013-11-30 NOTE — Patient Instructions (Signed)
Continue clopidogrel 75 mg orally every day for secondary stroke prevention and maintain strict control of hypertension with blood pressure goal below 130/90, diabetes with hemoglobin A1c goal below 6.5% and lipids with LDL cholesterol goal below 100 mg/dL.  Continue Lipitor to 10 mg daily.   Followup in 6 months, sooner as needed.

## 2013-11-30 NOTE — Progress Notes (Signed)
PATIENT: Randy Myers DOB: Jun 16, 1946   REASON FOR VISIT: follow up for tia HISTORY FROM: patient  HISTORY OF PRESENT ILLNESS: Randy Myers is an 68 y.o. male with a history of hypertension and arthritis who has been experiencing recurrent spells of dizziness with vertigo with associated nausea since 06/08/2013. There was associated numbness of the left side of his face with first episode. He had a severe spell on 06/10/2013 at 1400 which made him very unsteady on his feet in addition to having nausea and vomiting. He had no visual changes. There were no speech changes. 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 Left vertebral artery showed hypodensity indicative of thrombosis. No ischemia of the posterior circulation territories was noted. Further areas of occlusion involving left V3 and V4 segments were noted, and moderate to advanced tandem right V4 stenoses are also noted. No high-grade stenosis was noted involving anterior circulation. MRI showed 2 areas of infarction in the cerebellum. Patient had been taking aspirin 325 mg per day. NIH stroke score at this point is 0. Patient was not a TPA candidate secondary to delay in arrival and no clear objective neuro deficits. He did not need PT after discharge.   UPDATE 08/29/13 (LL): Randy Myers comes for post-stroke hospital follow up. He has been well, no further episodes of vertigo. He noticed after he went home after discharge that he could not feel temperature differences on his right side; especially the right arm. He has sensory deficit on the left side of his face as well. He denies any weakness. His BP is well controlled, and his LDL in hospital was 99, discharged on atorvastatin 40 mg. He is taking Plavix and aspirin daily since discharge. Patient denies medication side effects, with no signs of bleeding or excessive bruising.    UPDATE 11/30/13 (LL):  He comes in for stroke visit, 6 months since stroke.  He has been well, states that he had recent incidence of left eye pain which he states was similar to when he had stroke; later in the day he had generalized weakness.  He and wife are worried that this was another stroke, but symptoms resolved quickly and he had no lasting deficit.  His hemisensory deficits are unchanged.  BP is well controlled, it is 136/84 in office today.  He is tolerating plavix well with significant bruising.     REVIEW OF SYSTEMS: Full 14 system review of systems performed and notable only for:  Eyes: eye pain, light sensitivity ear/nose/throat: trouble swallowing  Hematology/Lymph: easy bruising  musculoskeletal: joint pain, cramps, aching muscles  neurological: headache, numbness, difficulty swallowing, weakness sleep: restless legs  ALLERGIES: No Known Allergies  HOME MEDICATIONS: Outpatient Prescriptions Prior to Visit  Medication Sig Dispense Refill  . amLODipine (NORVASC) 10 MG tablet Take 5 mg by mouth daily.      Marland Kitchen atorvastatin (LIPITOR) 10 MG tablet Take 1 tablet (10 mg total) by mouth daily.  90 tablet  3  . clopidogrel (PLAVIX) 75 MG tablet Take 1 tablet (75 mg total) by mouth daily with breakfast.  30 tablet  3  . FLUVIRIN INJ injection       . Multiple Vitamin (MULTIVITAMIN WITH MINERALS) TABS tablet Take 1 tablet by mouth daily. One a Day      . aspirin EC 81 MG EC tablet Take 1 tablet (81 mg total) by mouth daily.  120 tablet  2   PHYSICAL EXAM  Filed Vitals:   11/30/13 1525  BP: 136/84  Pulse: 40  Temp: 97.8 F (36.6 C)  TempSrc: Oral  Height: 6\' 1"  (1.854 m)  Weight: 213 lb (96.616 kg)   Body mass index is 28.11 kg/(m^2).  Generalized: In no acute distress  Neck: Supple, no carotid bruits  Cardiac: Regular rate rhythm, no murmur  Pulmonary: Clear to auscultation bilaterally  Musculoskeletal: No deformity   Neurological examination  Mentation: Alert  oriented to time, place, history taking, language fluent, and casual conversation  Cranial nerve II-XII: Pupils were equal round reactive to light extraocular movements were full, visual field were full on confrontational test. facial sensation and strength were normal. hearing was intact to finger rubbing bilaterally. Uvula tongue midline. head turning and shoulder shrug and were normal and symmetric.Tongue protrusion into cheek strength was normal.  MOTOR: normal bulk and tone, full strength in the BUE, BLE, fine finger movements normal, no pronator drift  SENSORY: increased to light touch, and pinprick on left side of face, temperature sense absent on right side.  COORDINATION: finger-nose-finger, heel-to-shin bilaterally, there was no truncal ataxia  REFLEXES: Brachioradialis 2/2, biceps 2/2, triceps 2/2, patellar 2/2, Achilles 2/2, plantar responses were flexor bilaterally.  GAIT/STATION: Rising up from seated position without assistance, stooped stance, without trunk ataxia, moderate stride, good arm swing, smooth turning, able to perform tiptoe, and heel walking and tandem without difficulty. Romberg negative.   ASSESSMENT AND PLAN Randy Myers is a 68 y.o. male presenting with recurrent spells of dizziness and vertigo with nausea and unsteady gait. MRI confirmed small acute cerebellar infarcts. Infarct likley thrombotic secondary to severe posterior circulation atherosclerotic disease - L VA occluded, R VA with stenosis. Patient with resultant hemisensory deficits.   Continue clopidogrel 75 mg orally every day for secondary stroke prevention and maintain strict control of hypertension with blood pressure goal below 130/90, diabetes with hemoglobin A1c goal below 6.5% and lipids with LDL cholesterol goal below 100 mg/dL.  Continue Lipitor 10 mg daily.   Followup in 6 months, sooner as needed. Carotid dopplers at next visit.  Philmore Pali, MSN, NP-C 11/30/2013, 4:11 PM [facility] [address] [place], [place] [address] [phone]  Note: This document was prepared with digital dictation and possible smart phrase technology. Any transcriptional errors that result from this process are unintentional.

## 2013-12-01 ENCOUNTER — Encounter: Payer: Self-pay | Admitting: Nurse Practitioner

## 2014-04-10 ENCOUNTER — Encounter: Payer: Self-pay | Admitting: Neurology

## 2014-05-30 ENCOUNTER — Ambulatory Visit: Payer: Medicare HMO | Admitting: Neurology

## 2014-05-30 ENCOUNTER — Ambulatory Visit (INDEPENDENT_AMBULATORY_CARE_PROVIDER_SITE_OTHER): Payer: Medicare HMO | Admitting: Neurology

## 2014-05-30 ENCOUNTER — Encounter: Payer: Self-pay | Admitting: Neurology

## 2014-05-30 VITALS — BP 119/68 | HR 65 | Ht 73.0 in | Wt 210.8 lb

## 2014-05-30 DIAGNOSIS — I6350 Cerebral infarction due to unspecified occlusion or stenosis of unspecified cerebral artery: Secondary | ICD-10-CM

## 2014-05-30 DIAGNOSIS — I10 Essential (primary) hypertension: Secondary | ICD-10-CM

## 2014-05-30 DIAGNOSIS — G463 Brain stem stroke syndrome: Secondary | ICD-10-CM | POA: Insufficient documentation

## 2014-05-30 DIAGNOSIS — I63219 Cerebral infarction due to unspecified occlusion or stenosis of unspecified vertebral arteries: Secondary | ICD-10-CM

## 2014-05-30 NOTE — Patient Instructions (Signed)
1. Continue plavix and lipitor for stroke prevention 2. Monitor BP at home, goal is 120-140, avoid low BP 3. Follow up with your PCP for stroke risk factor modification 4. Keep exercise daily 5. Follow up 3 months.

## 2014-05-30 NOTE — Progress Notes (Signed)
STROKE NEUROLOGY FOLLOW UP NOTE  NAME: Randy Myers DOB: [birth year]  REASON FOR VISIT: stroke follow up HISTORY FROM: wife  Today we had the pleasure of seeing Randy Myers in follow-up at our Neurology Clinic. Pt was accompanied by wife.   History Summary Randy Myers is an 68 y.o. male with a history of HTN and arthritis who has been experiencing recurrent spells of dizziness with vertigo with associated nausea since 06/08/2013. There was associated numbness of the left side of his face with first episode. He had a severe spell on 06/10/2013 at 1400 which made him very unsteady on his feet in addition to having nausea and vomiting. He had no visual changes. There were no speech changes. CTA of head and neck showed left vertebral artery occlusion at proximal V2 segment continuing to just proximal to the vertebral basilar junction. Further areas of occlusion involving left V3 and V4 segments were noted, and moderate to advanced tandem right V4 stenoses are also noted. MRI showed 2 areas of infarction in the cerebellum. Patient had been taking aspirin 325 mg per day. Therefore, he was discharged on ASA and plavix for 3 months as well as lipitor. He did not need PT after discharge.  UPDATE 08/29/13: Randy Myers comes for post-stroke hospital follow up. He has been well, no further episodes of vertigo. He noticed that he could not feel temperature differences on his right side; especially the right arm. He has sensory deficit on the left side of his face as well. He denies any weakness. His BP is well controlled. He is taking Plavix and aspirin daily since discharge. Patient denies medication side effects, with no signs of bleeding or excessive bruising.  UPDATE 11/30/13: He comes in for stroke visit, 6 months since stroke. He has been well, states that he had recent incidence of left eye pain which he states was similar to when he had stroke; later in the day he had generalized weakness. He and  wife are worried that this was another stroke, but symptoms resolved quickly and he had no lasting deficit. His sensory deficits are unchanged. BP is well controlled, it is 136/84 in office today. He is tolerating plavix well with significant bruising.   Interval History During the interval time, the patient has been doing well. His sensory deficit remained including right arm and leg decreased pinprick and temp, and left face decreased light touch. He also stated that right face some decreased temp sensation too. His BP a little low at home with lisinopril 20mg  and recently down to 10mg . Today in clinic 119/68. Otherwise, he has no complains.    REVIEW OF SYSTEMS: Full 14 system review of systems performed and notable only for those listed below and in HPI above, all others are negative:  Constitutional: N/A  Cardiovascular: N/A  Ear/Nose/Throat: ringing in the ear  Skin: N/A  Eyes: eye pain  Respiratory: N/A  Gastroitestinal: N/A  Genitourinary: N/A Hematology/Lymphatic: N/A  Endocrine: N/A  Musculoskeletal: joint pain, muscle cramps  Allergy/Immunology: N/A  Neurological: headache  Psychiatric: N/A  The following represents the patient's updated allergies and side effects list: No Known Allergies  Labs since last visit of relevance include the following: Results for orders placed during the hospital encounter of 06/10/13  CBC      Result Value Ref Range   WBC 7.8  4.0 - 10.5 K/uL   RBC 5.22  4.22 - 5.81 MIL/uL   Hemoglobin 15.6  13.0 - 17.0 g/dL  HCT 43.7  39.0 - 52.0 %   MCV 83.7  78.0 - 100.0 fL   MCH 29.9  26.0 - 34.0 pg   MCHC 35.7  30.0 - 36.0 g/dL   RDW 13.0  11.5 - 15.5 %   Platelets 173  150 - 400 K/uL  BASIC METABOLIC PANEL      Result Value Ref Range   Sodium 139  135 - 145 mEq/L   Potassium 3.5  3.5 - 5.1 mEq/L   Chloride 103  96 - 112 mEq/L   CO2 24  19 - 32 mEq/L   Glucose, Bld 173 (*) 70 - 99 mg/dL   BUN 14  6 - 23 mg/dL   Creatinine, Ser 0.82  0.50 -  1.35 mg/dL   Calcium 9.2  8.4 - 10.5 mg/dL   GFR calc non Af Amer 89 (*) >90 mL/min   GFR calc Af Amer >90  >90 mL/min  TROPONIN I      Result Value Ref Range   Troponin I <0.30  <0.30 ng/mL  COMPREHENSIVE METABOLIC PANEL      Result Value Ref Range   Sodium 139  135 - 145 mEq/L   Potassium 3.2 (*) 3.5 - 5.1 mEq/L   Chloride 103  96 - 112 mEq/L   CO2 25  19 - 32 mEq/L   Glucose, Bld 143 (*) 70 - 99 mg/dL   BUN 10  6 - 23 mg/dL   Creatinine, Ser 0.71  0.50 - 1.35 mg/dL   Calcium 8.9  8.4 - 10.5 mg/dL   Total Protein 6.5  6.0 - 8.3 g/dL   Albumin 3.8  3.5 - 5.2 g/dL   AST 17  0 - 37 U/L   ALT 11  0 - 53 U/L   Alkaline Phosphatase 67  39 - 117 U/L   Total Bilirubin 0.6  0.3 - 1.2 mg/dL   GFR calc non Af Amer >90  >90 mL/min   GFR calc Af Amer >90  >90 mL/min  CBC WITH DIFFERENTIAL      Result Value Ref Range   WBC 7.3  4.0 - 10.5 K/uL   RBC 5.08  4.22 - 5.81 MIL/uL   Hemoglobin 15.1  13.0 - 17.0 g/dL   HCT 42.6  39.0 - 52.0 %   MCV 83.9  78.0 - 100.0 fL   MCH 29.7  26.0 - 34.0 pg   MCHC 35.4  30.0 - 36.0 g/dL   RDW 13.2  11.5 - 15.5 %   Platelets 165  150 - 400 K/uL   Neutrophils Relative % 78 (*) 43 - 77 %   Neutro Abs 5.7  1.7 - 7.7 K/uL   Lymphocytes Relative 15  12 - 46 %   Lymphs Abs 1.1  0.7 - 4.0 K/uL   Monocytes Relative 7  3 - 12 %   Monocytes Absolute 0.5  0.1 - 1.0 K/uL   Eosinophils Relative 0  0 - 5 %   Eosinophils Absolute 0.0  0.0 - 0.7 K/uL   Basophils Relative 0  0 - 1 %   Basophils Absolute 0.0  0.0 - 0.1 K/uL  HEMOGLOBIN A1C      Result Value Ref Range   Hemoglobin A1C 5.5  <5.7 %   Mean Plasma Glucose 111  <117 mg/dL  LIPID PANEL      Result Value Ref Range   Cholesterol 151  0 - 200 mg/dL   Triglycerides 71  <150 mg/dL  HDL 38 (*) >39 mg/dL   Total CHOL/HDL Ratio 4.0     VLDL 14  0 - 40 mg/dL   LDL Cholesterol 99  0 - 99 mg/dL    The neurologically relevant items on the patient's problem list were reviewed on today's visit.  Neurologic  Examination  A problem focused neurological exam (12 or more points of the single system neurologic examination, vital signs counts as 1 point, cranial nerves count for 8 points) was performed.  Blood pressure 119/68, pulse 65, height 6\' 1"  (1.854 m), weight 210 lb 12.8 oz (95.618 kg).  General - Well nourished, well developed, in no apparent distress.  Ophthalmologic - not able to see through.  Cardiovascular - Regular rate and rhythm with no murmur.  Mental Status -  Level of arousal and orientation to time, place, and person were intact. Language including expression, naming, repetition, comprehension, reading, and writing was assessed and found intact.  Cranial Nerves II - XII - II - Vision intact OU. III, IV, VI - Extraocular movements intact. V - Facial sensation showed left side decreased light touch but intact temp and pinprick, right side slightly decreased temp and pinprick but intact light touch. VII - Facial movement intact bilaterally. VIII - Hearing & vestibular intact bilaterally. X - Palate elevates symmetrically. XI - Chin turning & shoulder shrug intact bilaterally. XII - Tongue protrusion intact.  Motor Strength - The patient's strength was normal in all extremities and pronator drift was absent.  Bulk was normal and fasciculations were absent.   Motor Tone - Muscle tone was assessed at the neck and appendages and was normal.  Reflexes - The patient's reflexes were normal in all extremities and he had no pathological reflexes.  Sensory - Light touch intact bilaterally, temperature/pinprick was decreased on right arm and leg.    Coordination - The patient had normal movements in the hands and feet with no ataxia or dysmetria.  Tremor was absent.  Gait and Station - mild slow and slight wide based gait.  Data reviewed: I personally reviewed the images and agree with the radiology interpretations.  MRI and MRA 06/2013 Small areas of acute infarction in the  right cerebellum, left posterior lateral medulla, and right splenium of the corpus callosum.  Chronic micro hemorrhage right cerebellum possibly from hypertension. Occlusion of the distal left vertebral artery which does not contribute to the basilar.  There is moderate disease in the distal right vertebral artery.  PICA is not visualized on MRA but was seen on CTA and  may be supplied by the AICA which is patent bilaterally. CTA head and neck 06/2013 1.  Left vertebral artery occlusion, as above. No acute infarct is detected, but in the setting of dizziness there may be small cerebellar infarcts.  Consider MRI correlate. A large left AICA serves the majority of the inferior left cerebellum.  2.  Tandem moderate to advanced intradural right vertebral artery stenoses - presumably atherosclerotic 3.  Moderate carotid siphon atherosclerosis. 4. Large left PCOM artery. 2D echo - Left ventricle: The cavity size was normal. Systolic   function was normal. The estimated ejection fraction was   in the range of 60% to 65%. Although no diagnostic   regional wall motion abnormality was identified, this   possibility cannot be completely excluded on the basis of   this study. - Mitral valve: Calcified annulus. - Left atrium: The atrium was moderately dilated. - Atrial septum: There was an atrial septal aneurysm.  Component  Latest Ref Rng 06/11/2013  Cholesterol     0 - 200 mg/dL 151  Triglycerides     <150 mg/dL 71  HDL     >39 mg/dL 38 (L)  Total CHOL/HDL Ratio      4.0  VLDL     0 - 40 mg/dL 14  LDL (calc)     0 - 99 mg/dL 99  Hemoglobin A1C     <5.7 % 5.5  Mean Plasma Glucose     <117 mg/dL 111    Assessment: As you may recall, he is a 68 y.o. Caucasian male with a diagnosis of stroke. He had brainstem and cerebellar as well as posterior CC infarcts in 06/2013. 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 His MRI also confirmed left  lateromedullary lesion. Finished 3 months dual antiplatelet course. Currently on plavix and lipitor for stroke prevention. Due to VA stenosis and occlusion, we do not want his BP too low. Goal 120-140. Today in clinic 119/68. Currently on lisinopril 10mg . Continue to monitor BP.  Plan:  - continue plavix and lipitor for stroke prevention - sensory deficit may remain - BP control, goal 120-140 - follow up with PCP for stroke risk factor modification - RTC in 3 months.  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   Patient Instructions  1. Continue plavix and lipitor for stroke prevention 2. Monitor BP at home, goal is 120-140, avoid low BP 3. Follow up with your PCP for stroke risk factor modification 4. Keep exercise daily 5. Follow up 3 months.   Rosalin Hawking, MD PhD [facility] [address], [place], [place] [address] [phone]

## 2014-09-03 ENCOUNTER — Encounter: Payer: Self-pay | Admitting: Neurology

## 2014-09-03 ENCOUNTER — Ambulatory Visit (INDEPENDENT_AMBULATORY_CARE_PROVIDER_SITE_OTHER): Payer: Medicare HMO | Admitting: Neurology

## 2014-09-03 VITALS — BP 138/86 | HR 72 | Ht 73.0 in | Wt 212.4 lb

## 2014-09-03 DIAGNOSIS — G464 Cerebellar stroke syndrome: Secondary | ICD-10-CM

## 2014-09-03 DIAGNOSIS — E785 Hyperlipidemia, unspecified: Secondary | ICD-10-CM | POA: Insufficient documentation

## 2014-09-03 DIAGNOSIS — I63219 Cerebral infarction due to unspecified occlusion or stenosis of unspecified vertebral arteries: Secondary | ICD-10-CM

## 2014-09-03 DIAGNOSIS — G463 Brain stem stroke syndrome: Principal | ICD-10-CM

## 2014-09-03 DIAGNOSIS — I639 Cerebral infarction, unspecified: Secondary | ICD-10-CM

## 2014-09-03 NOTE — Progress Notes (Signed)
STROKE NEUROLOGY FOLLOW UP NOTE  NAME: Randy Myers DOB: 22-Apr-1946  REASON FOR VISIT: stroke follow up HISTORY FROM: wife  Today we had the pleasure of seeing Randy Myers in follow-up at our Neurology Clinic. Pt was accompanied by wife.   History Summary Randy Myers is an 68 y.o. male with a history of HTN and arthritis who has been experiencing recurrent spells of dizziness with vertigo with associated nausea since 06/08/2013. There was associated numbness of the left side of his face with first episode. He had a severe spell on 06/10/2013 at 1400 which made him very unsteady on his feet in addition to having nausea and vomiting. He had no visual changes. There were no speech changes. CTA of head and neck showed left vertebral artery occlusion at proximal V2 segment continuing to just proximal to the vertebral basilar junction. Further areas of occlusion involving left V3 and V4 segments were noted, and moderate to advanced tandem right V4 stenoses are also noted. MRI showed 2 areas of infarction in the cerebellum. Patient had been taking aspirin 325 mg per day. Therefore, he was discharged on ASA and plavix for 3 months as well as lipitor. He did not need PT after discharge.   Follow up 08/29/13: Randy Myers comes for post-stroke hospital follow up. He has been well, no further episodes of vertigo. He noticed that he could not feel temperature differences on his right side; especially the right arm. He has sensory deficit on the left side of his face as well. He denies any weakness. His BP is well controlled. He is taking Plavix and aspirin daily since discharge. Patient denies medication side effects, with no signs of bleeding or excessive bruising.   Follow up 11/30/13: He comes in for stroke visit, 6 months since stroke. He has been well, states that he had recent incidence of left eye pain which he states was similar to when he had stroke; later in the day he had generalized weakness.  He and wife are worried that this was another stroke, but symptoms resolved quickly and he had no lasting deficit. His sensory deficits are unchanged. BP is well controlled, it is 136/84 in office today. He is tolerating plavix well with significant bruising.   05/30/14 follow up - the patient has been doing well. His sensory deficit remained including right arm and leg decreased pinprick and temp, and left face decreased light touch. He also stated that right face some decreased temp sensation too. His BP a little low at home with lisinopril 20mg  and recently down to 10mg . Today in clinic 119/68. Otherwise, he has no complains.    Interval History During the interval time, pt continued to do well. His sensory deficit remained including right arm and leg decreased pinprick and temp, and left face decreased light touch, without change since last visit. BP today 138/86, but found to have premature beat during auscutation. His EKG on 8/12 also showed PVC trigeminy. He is still on plavix and lipitor. No new complains.  REVIEW OF SYSTEMS: Full 14 system review of systems performed and notable only for those listed below and in HPI above, all others are negative:  Constitutional: excessively sweating  Cardiovascular: N/A  Ear/Nose/Throat: ringing in the ear  Skin: N/A  Eyes: eye pain, light sensitivity  Respiratory: N/A  Gastroitestinal: N/A  Genitourinary: N/A Hematology/Lymphatic: N/A  Endocrine: N/A  Musculoskeletal: joint pain, muscle cramps  Allergy/Immunology: N/A  Neurological: headache, restless leg Psychiatric: N/A  The  following represents the patient's updated allergies and side effects list: No Known Allergies  Labs since last visit of relevance include the following: Results for orders placed or performed during the hospital encounter of 06/10/13  CBC  Result Value Ref Range   WBC 7.8 4.0 - 10.5 K/uL   RBC 5.22 4.22 - 5.81 MIL/uL   Hemoglobin 15.6 13.0 - 17.0 g/dL   HCT 43.7  39.0 - 52.0 %   MCV 83.7 78.0 - 100.0 fL   MCH 29.9 26.0 - 34.0 pg   MCHC 35.7 30.0 - 36.0 g/dL   RDW 13.0 11.5 - 15.5 %   Platelets 173 150 - 400 K/uL  Basic metabolic panel  Result Value Ref Range   Sodium 139 135 - 145 mEq/L   Potassium 3.5 3.5 - 5.1 mEq/L   Chloride 103 96 - 112 mEq/L   CO2 24 19 - 32 mEq/L   Glucose, Bld 173 (H) 70 - 99 mg/dL   BUN 14 6 - 23 mg/dL   Creatinine, Ser 0.82 0.50 - 1.35 mg/dL   Calcium 9.2 8.4 - 10.5 mg/dL   GFR calc non Af Amer 89 (L) >90 mL/min   GFR calc Af Amer >90 >90 mL/min  Troponin I  Result Value Ref Range   Troponin I <0.30 <0.30 ng/mL  Comprehensive metabolic panel  Result Value Ref Range   Sodium 139 135 - 145 mEq/L   Potassium 3.2 (L) 3.5 - 5.1 mEq/L   Chloride 103 96 - 112 mEq/L   CO2 25 19 - 32 mEq/L   Glucose, Bld 143 (H) 70 - 99 mg/dL   BUN 10 6 - 23 mg/dL   Creatinine, Ser 0.71 0.50 - 1.35 mg/dL   Calcium 8.9 8.4 - 10.5 mg/dL   Total Protein 6.5 6.0 - 8.3 g/dL   Albumin 3.8 3.5 - 5.2 g/dL   AST 17 0 - 37 U/L   ALT 11 0 - 53 U/L   Alkaline Phosphatase 67 39 - 117 U/L   Total Bilirubin 0.6 0.3 - 1.2 mg/dL   GFR calc non Af Amer >90 >90 mL/min   GFR calc Af Amer >90 >90 mL/min  CBC with Differential  Result Value Ref Range   WBC 7.3 4.0 - 10.5 K/uL   RBC 5.08 4.22 - 5.81 MIL/uL   Hemoglobin 15.1 13.0 - 17.0 g/dL   HCT 42.6 39.0 - 52.0 %   MCV 83.9 78.0 - 100.0 fL   MCH 29.7 26.0 - 34.0 pg   MCHC 35.4 30.0 - 36.0 g/dL   RDW 13.2 11.5 - 15.5 %   Platelets 165 150 - 400 K/uL   Neutrophils Relative % 78 (H) 43 - 77 %   Neutro Abs 5.7 1.7 - 7.7 K/uL   Lymphocytes Relative 15 12 - 46 %   Lymphs Abs 1.1 0.7 - 4.0 K/uL   Monocytes Relative 7 3 - 12 %   Monocytes Absolute 0.5 0.1 - 1.0 K/uL   Eosinophils Relative 0 0 - 5 %   Eosinophils Absolute 0.0 0.0 - 0.7 K/uL   Basophils Relative 0 0 - 1 %   Basophils Absolute 0.0 0.0 - 0.1 K/uL  Hemoglobin A1c  Result Value Ref Range   Hgb A1c MFr Bld 5.5 <5.7 %   Mean Plasma  Glucose 111 <117 mg/dL  Lipid panel  Result Value Ref Range   Cholesterol 151 0 - 200 mg/dL   Triglycerides 71 <150 mg/dL   HDL 38 (L) >39  mg/dL   Total CHOL/HDL Ratio 4.0 RATIO   VLDL 14 0 - 40 mg/dL   LDL Cholesterol 99 0 - 99 mg/dL    The neurologically relevant items on the patient's problem list were reviewed on today's visit.  Neurologic Examination  A problem focused neurological exam (12 or more points of the single system neurologic examination, vital signs counts as 1 point, cranial nerves count for 8 points) was performed.  Blood pressure 138/86, pulse 72, height 6\' 1"  (1.854 m), weight 212 lb 6.4 oz (96.344 kg).  General - Well nourished, well developed, in no apparent distress.  Ophthalmologic - not able to see through.  Cardiovascular - Regular rate and rhythm with no murmur.  Mental Status -  Level of arousal and orientation to time, place, and person were intact. Language including expression, naming, repetition, comprehension, reading, and writing was assessed and found intact.  Cranial Nerves II - XII - II - Vision intact OU. III, IV, VI - Extraocular movements intact. V - Facial sensation showed left side decreased light touch but intact temp and pinprick, right side slightly decreased temp and pinprick but intact light touch. VII - Facial movement intact bilaterally. VIII - Hearing & vestibular intact bilaterally. X - Palate elevates symmetrically. XI - Chin turning & shoulder shrug intact bilaterally. XII - Tongue protrusion intact.  Motor Strength - The patient's strength was normal in all extremities and pronator drift was absent.  Bulk was normal and fasciculations were absent.   Motor Tone - Muscle tone was assessed at the neck and appendages and was normal.  Reflexes - The patient's reflexes were normal in all extremities and he had no pathological reflexes.  Sensory - Light touch intact bilaterally, temperature/pinprick was decreased on right arm  and leg.    Coordination - The patient had normal movements in the hands and feet with no ataxia or dysmetria.  Tremor was absent.  Gait and Station - mild slow and slight wide based gait.  Data reviewed: I personally reviewed the images and agree with the radiology interpretations.  MRI and MRA 06/2013 Small areas of acute infarction in the right cerebellum, left posterior lateral medulla, and right splenium of the corpus callosum.  Chronic micro hemorrhage right cerebellum possibly from hypertension. Occlusion of the distal left vertebral artery which does not contribute to the basilar.  There is moderate disease in the distal right vertebral artery.  PICA is not visualized on MRA but was seen on CTA and  may be supplied by the AICA which is patent bilaterally. CTA head and neck 06/2013 1.  Left vertebral artery occlusion, as above. No acute infarct is detected, but in the setting of dizziness there may be small cerebellar infarcts.  Consider MRI correlate. A large left AICA serves the majority of the inferior left cerebellum.  2.  Tandem moderate to advanced intradural right vertebral artery stenoses - presumably atherosclerotic 3.  Moderate carotid siphon atherosclerosis. 4. Large left PCOM artery. 2D echo - Left ventricle: The cavity size was normal. Systolic   function was normal. The estimated ejection fraction was   in the range of 60% to 65%. Although no diagnostic   regional wall motion abnormality was identified, this   possibility cannot be completely excluded on the basis of   this study. - Mitral valve: Calcified annulus. - Left atrium: The atrium was moderately dilated. - Atrial septum: There was an atrial septal aneurysm.  Component     Latest Ref Rng 06/11/2013  Cholesterol     0 - 200 mg/dL 151  Triglycerides     <150 mg/dL 71  HDL     >39 mg/dL 38 (L)  Total CHOL/HDL Ratio      4.0  VLDL     0 - 40 mg/dL 14  LDL (calc)     0 - 99 mg/dL 99  Hemoglobin A1C      <5.7 % 5.5  Mean Plasma Glucose     <117 mg/dL 111    Assessment: As you may recall, he is a 68 y.o. Caucasian male with a diagnosis of stroke. He had brainstem and cerebellar as well as posterior CC infarcts in 06/2013. 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 His MRI also confirmed left lateromedullary lesion. Finished 3 months dual antiplatelet course. Currently on plavix and lipitor for stroke prevention. Due to VA stenosis and occlusion, we do not want his BP too low. Goal 120-150. Continue to monitor BP.  Plan:  - continue plavix and lipitor for stroke prevention - BP control, goal 120-150, to avoid posterior hypoperfusion - check BP at home - follow up with PCP for stroke risk factor modification - RTC PRN.   No orders of the defined types were placed in this encounter.   Patient Instructions  - Continue plavix and lipitor for stroke prevention - avoid hypotension, goal 120-150 - check BP at home - Follow up with your primary care physician for stroke risk factor modification. Recommend maintain blood pressure goal <130/80, diabetes with hemoglobin A1c goal below 6.5% and lipids with LDL cholesterol goal below 70 mg/dL.  - follow up as needed.    Rosalin Hawking, MD PhD [facility] [address] [place],  [address] [phone]

## 2014-09-03 NOTE — Patient Instructions (Signed)
-   Continue plavix and lipitor for stroke prevention - avoid hypotension, goal 120-150 - check BP at home - Follow up with your primary care physician for stroke risk factor modification. Recommend maintain blood pressure goal <130/80, diabetes with hemoglobin A1c goal below 6.5% and lipids with LDL cholesterol goal below 70 mg/dL.  - follow up as needed.

## 2015-01-27 ENCOUNTER — Other Ambulatory Visit: Payer: Self-pay | Admitting: Family Medicine

## 2015-01-27 ENCOUNTER — Ambulatory Visit
Admission: RE | Admit: 2015-01-27 | Discharge: 2015-01-27 | Disposition: A | Discharge status: Home / Self Care | Payer: Medicare HMO | Source: Ambulatory Visit | Attending: Family Medicine | Admitting: Family Medicine

## 2015-01-27 DIAGNOSIS — S60222A Contusion of left hand, initial encounter: Secondary | ICD-10-CM

## 2015-01-27 DIAGNOSIS — S6992XA Unspecified injury of left wrist, hand and finger(s), initial encounter: Secondary | ICD-10-CM | POA: Diagnosis not present

## 2015-01-27 DIAGNOSIS — S40022A Contusion of left upper arm, initial encounter: Secondary | ICD-10-CM

## 2015-01-27 DIAGNOSIS — M79642 Pain in left hand: Secondary | ICD-10-CM | POA: Diagnosis not present

## 2015-01-27 DIAGNOSIS — M25512 Pain in left shoulder: Secondary | ICD-10-CM | POA: Diagnosis not present

## 2015-01-27 DIAGNOSIS — S4992XA Unspecified injury of left shoulder and upper arm, initial encounter: Secondary | ICD-10-CM | POA: Diagnosis not present

## 2015-04-21 DIAGNOSIS — Z125 Encounter for screening for malignant neoplasm of prostate: Secondary | ICD-10-CM | POA: Diagnosis not present

## 2015-04-21 DIAGNOSIS — I10 Essential (primary) hypertension: Secondary | ICD-10-CM | POA: Diagnosis not present

## 2015-04-21 DIAGNOSIS — Z1389 Encounter for screening for other disorder: Secondary | ICD-10-CM | POA: Diagnosis not present

## 2015-04-21 DIAGNOSIS — Z0000 Encounter for general adult medical examination without abnormal findings: Secondary | ICD-10-CM | POA: Diagnosis not present

## 2015-04-21 DIAGNOSIS — E780 Pure hypercholesterolemia: Secondary | ICD-10-CM | POA: Diagnosis not present

## 2016-04-26 DIAGNOSIS — I10 Essential (primary) hypertension: Secondary | ICD-10-CM | POA: Diagnosis not present

## 2016-04-26 DIAGNOSIS — Z1389 Encounter for screening for other disorder: Secondary | ICD-10-CM | POA: Diagnosis not present

## 2016-04-26 DIAGNOSIS — Z125 Encounter for screening for malignant neoplasm of prostate: Secondary | ICD-10-CM | POA: Diagnosis not present

## 2016-04-26 DIAGNOSIS — Z8673 Personal history of transient ischemic attack (TIA), and cerebral infarction without residual deficits: Secondary | ICD-10-CM | POA: Diagnosis not present

## 2016-04-26 DIAGNOSIS — M2041 Other hammer toe(s) (acquired), right foot: Secondary | ICD-10-CM | POA: Diagnosis not present

## 2016-04-26 DIAGNOSIS — E7800 Pure hypercholesterolemia, unspecified: Secondary | ICD-10-CM | POA: Diagnosis not present

## 2016-04-26 DIAGNOSIS — M2042 Other hammer toe(s) (acquired), left foot: Secondary | ICD-10-CM | POA: Diagnosis not present

## 2016-04-26 DIAGNOSIS — Z0000 Encounter for general adult medical examination without abnormal findings: Secondary | ICD-10-CM | POA: Diagnosis not present

## 2016-05-17 ENCOUNTER — Ambulatory Visit: Payer: Medicare HMO | Admitting: Podiatry

## 2016-05-21 DIAGNOSIS — R972 Elevated prostate specific antigen [PSA]: Secondary | ICD-10-CM | POA: Diagnosis not present

## 2016-11-02 DIAGNOSIS — R3915 Urgency of urination: Secondary | ICD-10-CM | POA: Diagnosis not present

## 2016-11-02 DIAGNOSIS — R972 Elevated prostate specific antigen [PSA]: Secondary | ICD-10-CM | POA: Diagnosis not present

## 2016-12-22 DIAGNOSIS — R3915 Urgency of urination: Secondary | ICD-10-CM | POA: Diagnosis not present

## 2016-12-22 DIAGNOSIS — R972 Elevated prostate specific antigen [PSA]: Secondary | ICD-10-CM | POA: Diagnosis not present

## 2016-12-22 DIAGNOSIS — N401 Enlarged prostate with lower urinary tract symptoms: Secondary | ICD-10-CM | POA: Diagnosis not present

## 2017-05-02 DIAGNOSIS — Z0000 Encounter for general adult medical examination without abnormal findings: Secondary | ICD-10-CM | POA: Diagnosis not present

## 2017-05-02 DIAGNOSIS — I10 Essential (primary) hypertension: Secondary | ICD-10-CM | POA: Diagnosis not present

## 2017-05-02 DIAGNOSIS — E7800 Pure hypercholesterolemia, unspecified: Secondary | ICD-10-CM | POA: Diagnosis not present

## 2017-05-02 DIAGNOSIS — M2041 Other hammer toe(s) (acquired), right foot: Secondary | ICD-10-CM | POA: Diagnosis not present

## 2017-05-02 DIAGNOSIS — D485 Neoplasm of uncertain behavior of skin: Secondary | ICD-10-CM | POA: Diagnosis not present

## 2017-05-02 DIAGNOSIS — Z1159 Encounter for screening for other viral diseases: Secondary | ICD-10-CM | POA: Diagnosis not present

## 2017-05-02 DIAGNOSIS — Z8673 Personal history of transient ischemic attack (TIA), and cerebral infarction without residual deficits: Secondary | ICD-10-CM | POA: Diagnosis not present

## 2017-05-02 DIAGNOSIS — K219 Gastro-esophageal reflux disease without esophagitis: Secondary | ICD-10-CM | POA: Diagnosis not present

## 2017-05-02 DIAGNOSIS — R972 Elevated prostate specific antigen [PSA]: Secondary | ICD-10-CM | POA: Diagnosis not present

## 2017-05-02 DIAGNOSIS — M2042 Other hammer toe(s) (acquired), left foot: Secondary | ICD-10-CM | POA: Diagnosis not present

## 2017-06-03 ENCOUNTER — Ambulatory Visit (INDEPENDENT_AMBULATORY_CARE_PROVIDER_SITE_OTHER): Payer: Medicare HMO

## 2017-06-03 ENCOUNTER — Encounter: Payer: Self-pay | Admitting: Podiatry

## 2017-06-03 ENCOUNTER — Ambulatory Visit (INDEPENDENT_AMBULATORY_CARE_PROVIDER_SITE_OTHER): Payer: Medicare HMO | Admitting: Podiatry

## 2017-06-03 DIAGNOSIS — M2042 Other hammer toe(s) (acquired), left foot: Secondary | ICD-10-CM | POA: Diagnosis not present

## 2017-06-03 DIAGNOSIS — B353 Tinea pedis: Secondary | ICD-10-CM | POA: Diagnosis not present

## 2017-06-03 DIAGNOSIS — M2041 Other hammer toe(s) (acquired), right foot: Secondary | ICD-10-CM | POA: Diagnosis not present

## 2017-06-03 DIAGNOSIS — M21619 Bunion of unspecified foot: Secondary | ICD-10-CM | POA: Diagnosis not present

## 2017-06-03 DIAGNOSIS — R52 Pain, unspecified: Secondary | ICD-10-CM

## 2017-06-03 MED ORDER — KETOCONAZOLE 2 % EX CREA: 1.0000 "application " | TOPICAL_CREAM | Freq: Every day | CUTANEOUS | 2 refills | Status: DC

## 2017-06-03 NOTE — Patient Instructions (Signed)
Look for an extra-depth shoe to help take pressure off of the toes. Also look for a shoe that has a softer toe box.

## 2017-06-03 NOTE — Progress Notes (Signed)
   Subjective:    Patient ID: Randy Myers, male    DOB: April 17, 1946, 71 y.o.   MRN: [id number]  HPI  71 year old male persists the office if concerns of bilateral foot pain. He states that his big toes are moving and worsening he is currently get the bunion on both of his feet his toes drawing up. He states that because the toes drawing up he rubs a hole in his shoes at times. He states he gets pain mostly when he wears his work boots. He denies any recent injury or trauma. He did have a stroke about 3 years ago and since has no numbness or tingling to his feet.Marland Kitchen He denies any swelling or redness to his feet. He has no other concerns today.  Review of Systems  Allergic/Immunologic: Positive for food allergies.       Red meat   All other systems reviewed and are negative.      Objective:   Physical Exam General: AAO x3, NAD  Dermatological: Interdigitally there is dry, erythematous skin consistent with tinea pedis. There is no lesion identified at this time.  Vascular DP/PT pulses 2/4, CRT less than 3 seconds. There is no pain with calf compression, swelling, warmth, erythema.   Neruologic: Grossly intact via light touch bilateral. Vibratory intact via tuning fork bilateral. Protective threshold with Semmes Wienstein monofilament intact to all pedal sites bilateral.  Musculoskelesymptomatic HAV is present bilaterally the right side worse on left. There is rigid hammertoe contractures of the right second toe worse than the left. There is no area pinpoint bony tenderness or pain the vibratory sensation. Mscular strength 5/5 in all groups tested bilateral.  Gait: Unassisted, Nonantalgic.     Assessment & Plan:   71 year old male with bilateral foot pain due to hammertoe, HAV; tinea pedis -Treatment options discussed including all alternatives, risks, and complications -Etiology of symptoms were discussed -X-rays were obtained and reviewed with the patient. HAV is present bilaterally as  well as hammertoes. There is no evidence of acute fracture identified today. -Discussed with him ultimately when he did work on change in his shoe. Discussed with him an extra-depth shoe as well as a stylus she would help take pressure off the digits. Also dispensed offloading pads to the digital deformities. -Ketoconazole prescribed for tinea pedis  Celesta Gentile, DPM

## 2017-06-06 DIAGNOSIS — M2042 Other hammer toe(s) (acquired), left foot: Secondary | ICD-10-CM

## 2017-06-06 DIAGNOSIS — M2041 Other hammer toe(s) (acquired), right foot: Secondary | ICD-10-CM | POA: Insufficient documentation

## 2017-06-06 DIAGNOSIS — B353 Tinea pedis: Secondary | ICD-10-CM | POA: Insufficient documentation

## 2017-06-06 DIAGNOSIS — M21619 Bunion of unspecified foot: Secondary | ICD-10-CM | POA: Insufficient documentation

## 2017-06-15 DIAGNOSIS — R972 Elevated prostate specific antigen [PSA]: Secondary | ICD-10-CM | POA: Diagnosis not present

## 2017-06-22 DIAGNOSIS — R972 Elevated prostate specific antigen [PSA]: Secondary | ICD-10-CM | POA: Diagnosis not present

## 2017-06-22 DIAGNOSIS — N401 Enlarged prostate with lower urinary tract symptoms: Secondary | ICD-10-CM | POA: Diagnosis not present

## 2017-06-22 DIAGNOSIS — R3915 Urgency of urination: Secondary | ICD-10-CM | POA: Diagnosis not present

## 2017-07-21 DIAGNOSIS — R972 Elevated prostate specific antigen [PSA]: Secondary | ICD-10-CM | POA: Diagnosis not present

## 2017-08-01 DIAGNOSIS — C61 Malignant neoplasm of prostate: Secondary | ICD-10-CM | POA: Diagnosis not present

## 2017-08-05 DIAGNOSIS — C61 Malignant neoplasm of prostate: Secondary | ICD-10-CM | POA: Diagnosis not present

## 2017-08-05 DIAGNOSIS — R972 Elevated prostate specific antigen [PSA]: Secondary | ICD-10-CM | POA: Diagnosis not present

## 2017-08-09 ENCOUNTER — Encounter: Payer: Self-pay | Admitting: Radiation Oncology

## 2017-08-09 DIAGNOSIS — C61 Malignant neoplasm of prostate: Secondary | ICD-10-CM | POA: Diagnosis not present

## 2017-08-24 ENCOUNTER — Encounter: Payer: Self-pay | Admitting: Radiation Oncology

## 2017-08-29 ENCOUNTER — Encounter: Payer: Self-pay | Admitting: Medical Oncology

## 2017-08-29 ENCOUNTER — Encounter: Payer: Self-pay | Admitting: Radiation Oncology

## 2017-08-29 ENCOUNTER — Ambulatory Visit
Admission: RE | Admit: 2017-08-29 | Discharge: 2017-08-29 | Disposition: A | Discharge status: Home / Self Care | Payer: Medicare HMO | Source: Ambulatory Visit | Attending: Radiation Oncology | Admitting: Radiation Oncology

## 2017-08-29 VITALS — BP 129/75 | HR 56 | Temp 97.9°F | Resp 20 | Ht 72.0 in | Wt 205.6 lb

## 2017-08-29 DIAGNOSIS — Z8673 Personal history of transient ischemic attack (TIA), and cerebral infarction without residual deficits: Secondary | ICD-10-CM | POA: Diagnosis not present

## 2017-08-29 DIAGNOSIS — G43909 Migraine, unspecified, not intractable, without status migrainosus: Secondary | ICD-10-CM | POA: Insufficient documentation

## 2017-08-29 DIAGNOSIS — I10 Essential (primary) hypertension: Secondary | ICD-10-CM | POA: Diagnosis not present

## 2017-08-29 DIAGNOSIS — Z510 Encounter for antineoplastic radiation therapy: Secondary | ICD-10-CM | POA: Insufficient documentation

## 2017-08-29 DIAGNOSIS — Z8249 Family history of ischemic heart disease and other diseases of the circulatory system: Secondary | ICD-10-CM | POA: Diagnosis not present

## 2017-08-29 DIAGNOSIS — Z809 Family history of malignant neoplasm, unspecified: Secondary | ICD-10-CM | POA: Insufficient documentation

## 2017-08-29 DIAGNOSIS — R972 Elevated prostate specific antigen [PSA]: Secondary | ICD-10-CM | POA: Diagnosis not present

## 2017-08-29 DIAGNOSIS — Z79899 Other long term (current) drug therapy: Secondary | ICD-10-CM | POA: Diagnosis not present

## 2017-08-29 DIAGNOSIS — Z808 Family history of malignant neoplasm of other organs or systems: Secondary | ICD-10-CM | POA: Insufficient documentation

## 2017-08-29 DIAGNOSIS — C61 Malignant neoplasm of prostate: Secondary | ICD-10-CM | POA: Diagnosis not present

## 2017-08-29 DIAGNOSIS — Z8042 Family history of malignant neoplasm of prostate: Secondary | ICD-10-CM | POA: Diagnosis not present

## 2017-08-29 HISTORY — DX: Malignant neoplasm of prostate: C61

## 2017-08-29 NOTE — Progress Notes (Signed)
GU Location of Tumor / Histology: intermediate risk prostatic adenocarcinoma  If Prostate Cancer, Gleason Score is (3 + 4) and PSA is (4.59). Prostate volume: 38 grams  09/2013 PSA  1.4 03/2015  PSA  2.3 04/2016  PSA  4.55 completed two week course of Cipro 05/2016  PSA  4.65  Biopsies of prostate (if applicable) revealed:    Past/Anticipated interventions by urology, if any: prostate biopsy, CT abdomen (negative), referral to Dr. Tammi Klippel to discuss IMRT plus ADT   Past/Anticipated interventions by medical oncology, if any: no  Weight changes, if any: no  Bowel/Bladder complaints, if any: frequency, urgency, nocturia and ED   Nausea/Vomiting, if any: yes  Pain issues, if any:  Generalized all over,knnes,feet, fingers arthritis  SAFETY ISSUES: No  Prior radiation? NO  Pacemaker/ICD? NO  Is the patient on methotrexate? NO  Current Complaints / other details:  71 year old male. NKA. Married with one son. Lives in [place]. , had CVA 4 years ago, 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 Father - prostate cancer.  Allergies:NKA  BP 129/75   Pulse (!) 56   Temp 97.9 F (36.6 C) (Oral)   Resp 20   Ht 6' (1.829 m)   Wt 205 lb 9.6 oz (93.3 kg)   BMI 27.88 kg/m   Wt Readings from Last 3 Encounters:  08/29/17 205 lb 9.6 oz (93.3 kg)  09/03/14 212 lb 6.4 oz (96.3 kg)  05/30/14 210 lb 12.8 oz (95.6 kg)

## 2017-08-29 NOTE — Progress Notes (Signed)
Radiation Oncology         [phone] ________________________________  Initial outpatient Consultation  Name: Randy Myers MRN: [id number]  Date: 08/29/2017  DOB: Apr 25, 1946  UV:OZDGU, Randy Baltimore, MD  Festus Aloe, MD   REFERRING PHYSICIAN: Festus Aloe, MD  DIAGNOSIS: 71 y.o. gentleman with Stage T2a intermediate risk adenocarcinoma of the prostate with Gleason Score of 3+4, and PSA of 4.59    ICD-10-CM   1. Adenocarcinoma of prostate ([place]) C61     HISTORY OF PRESENT ILLNESS: Randy Myers is a 71 y.o. male with a diagnosis of prostate cancer. He was noted to have an elevated PSA by his primary care physician, Dr. Alyson Myers.  Accordingly, he was referred for evaluation in urology by Dr. Junious Myers on 06/22/17,  digital rectal examination was performed at that time revealing palpable right base nodule.  The patient proceeded to transrectal ultrasound with 12 biopsies of the prostate on 07/21/17.  The prostate volume measured 38 cc.  Out of 12 core biopsies, 11 were positive.  The maximum Gleason score was 3+4, and this was seen in the right side. The patient reviewed the biopsy results with his urologist and he has kindly been referred today for discussion of potential radiation treatment options. He is not a candidate for surgery given his prior CVA and age, and Dr. Junious Myers is planning to proceed with 6 months of ADT and feels he is a good candidate for external beam radiotherapy.Marland Kitchen        PREVIOUS RADIATION THERAPY: No  PAST MEDICAL HISTORY:  Past Medical History:  Diagnosis Date  . Arthritis   . Hypertension   . Migraine   . Prostate cancer ([place])   . Stroke [facility]) 06/10/2013      PAST SURGICAL HISTORY: Past Surgical History:  Procedure Laterality Date  . BACK SURGERY  1983  . PROSTATE BIOPSY      FAMILY HISTORY:  Family History  Problem Relation Age of Onset  . CAD Father   . Cancer Father        Prostate  . Heart disease Father   . Brain cancer Sister   .  Cancer Sister        brain    SOCIAL HISTORY:  Social History   Social History  . Marital status: Married    Spouse name: Randy Myers  . Number of children: 1  . Years of education: HS   Occupational History  . Retired    Social History Main Topics  . Smoking status: Never Smoker  . Smokeless tobacco: Never Used  . Alcohol use No  . Drug use: No  . Sexual activity: Not on file   Other Topics Concern  . Not on file   Social History Narrative   Patient is married with one child.   Patient is left handed.   Patient has high school education.   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  ALLERGIES: Patient has no known allergies.  MEDICATIONS:  Current Outpatient Prescriptions  Medication Sig Dispense Refill  . acetaminophen (TYLENOL) 500 MG tablet Take 500 mg by mouth every 8 (eight) hours as needed for mild pain.    Marland Kitchen atorvastatin (LIPITOR) 10 MG tablet Take 1 tablet (10 mg total) by mouth daily. 90 tablet 3  . clopidogrel (PLAVIX) 75 MG tablet Take 1 tablet (75 mg total) by mouth daily with breakfast. 30 tablet 3  . lisinopril (PRINIVIL,ZESTRIL) 5 MG tablet Take 5 mg by mouth daily.     Marland Kitchen  Multiple Vitamin (MULTIVITAMIN WITH MINERALS) TABS tablet Take 1 tablet by mouth daily. One a Day    . ketoconazole (NIZORAL) 2 % cream Apply 1 application topically daily. (Patient not taking: Reported on 08/29/2017) 60 g 2   No current facility-administered medications for this encounter.     REVIEW OF SYSTEMS:  On review of systems, the patient reports that he is doing well overall. He denies any chest pain, shortness of breath, cough, fevers, chills, night sweats, unintended weight changes. He denies abdominal pain. He does report some constipation. He does report nausea and vomiting related to acid reflux medication. He reports generalized pain over his knees, feet, and fingers related to arthritis. He is taking Tamsulosin. He has a history of stroke and states  his right-side does not tell temperature well anymore. He denies speech changes. He does admit he gets "a little wobbly" every now and then but he has several canes at home. He continues to stay active and does his own yard-work. His IPSS was 20, indicating severe urinary symptoms including frequency, urgency, nocturia, and ED. He is able to complete sexual activity with some attempts. A complete review of systems is obtained and is otherwise negative.    PHYSICAL EXAM:  Wt Readings from Last 3 Encounters:  08/29/17 205 lb 9.6 oz (93.3 kg)  09/03/14 212 lb 6.4 oz (96.3 kg)  05/30/14 210 lb 12.8 oz (95.6 kg)   Temp Readings from Last 3 Encounters:  08/29/17 97.9 F (36.6 C) (Oral)  11/30/13 97.8 F (36.6 C) (Oral)  08/29/13 98.3 F (36.8 C) (Oral)   BP Readings from Last 3 Encounters:  08/29/17 129/75  09/03/14 138/86  05/30/14 119/68   Pulse Readings from Last 3 Encounters:  08/29/17 (!) 56  09/03/14 72  05/30/14 65   Pain Assessment Pain Score: 5 /10  In general this is a well appearing caucasian male in no acute distress. Accompanied by his wife today. He is alert and oriented x4 and appropriate throughout the examination. HEENT reveals that the patient is normocephalic, atraumatic. EOMs are intact. PERRLA. Skin is intact without any evidence of gross lesions. Cardiovascular exam reveals a regular rate and notable drop in S1 after 4-5 beats, no clicks rubs or murmurs are auscultated. Chest is clear to auscultation bilaterally. Lymphatic assessment is performed and does not reveal any adenopathy in the cervical, supraclavicular, axillary, or inguinal chains. Abdomen has active bowel sounds in all quadrants and is intact. The abdomen is soft, non tender, non distended. Lower extremities are negative for pretibial pitting edema, deep calf tenderness, cyanosis or clubbing.   KPS = 90  100 - Normal; no complaints; no evidence of disease. 90   - Able to carry on normal activity; minor  signs or symptoms of disease. 80   - Normal activity with effort; some signs or symptoms of disease. 47   - Cares for self; unable to carry on normal activity or to do active work. 60   - Requires occasional assistance, but is able to care for most of his personal needs. 50   - Requires considerable assistance and frequent medical care. 68   - Disabled; requires special care and assistance. 41   - Severely disabled; hospital admission is indicated although death not imminent. 68   - Very sick; hospital admission necessary; active supportive treatment necessary. 10   - Moribund; fatal processes progressing rapidly. 0     - Dead  Karnofsky DA, Abelmann Hollister, Craver LS and Burchenal [facility] (  1948) The use of the nitrogen mustards in the palliative treatment of carcinoma: with particular reference to bronchogenic carcinoma Cancer 1 634-56  LABORATORY DATA:  Lab Results  Component Value Date   WBC 7.3 06/11/2013   HGB 15.1 06/11/2013   HCT 42.6 06/11/2013   MCV 83.9 06/11/2013   PLT 165 06/11/2013   Lab Results  Component Value Date   NA 139 06/11/2013   K 3.2 (L) 06/11/2013   CL 103 06/11/2013   CO2 25 06/11/2013   Lab Results  Component Value Date   ALT 11 06/11/2013   AST 17 06/11/2013   ALKPHOS 67 06/11/2013   BILITOT 0.6 06/11/2013     RADIOGRAPHY: No results found.    IMPRESSION/PLAN: 1. 71 y.o. gentleman with Stage T2a intermediate risk adenocarcinoma of the prostate with Gleason Score of 3+4, and PSA of 4.59.  We reviewed the findings and workup thus far.  We discussed the natural history of prostate cancer.  We reviewed the the implications of T-stage, Gleason's Score, and PSA on decision-making and outcomes in prostate cancer.  We discussed radiation treatment in the management of prostate cancer with regard to the logistics and delivery of external beam radiation treatment as well as the utility of ADT.  The patient is in agreement to proceed. We anticipate 8 weeks of  radiotherapy directed to the prostate. The patient would like to proceed with prostate IMRT.  We will share our findings with Dr. Junious Myers and move forward with scheduling ADT injection and placement of three gold fiducial markers into the prostate to proceed with IMRT 2 months following administration of ADT.     Carola Rhine, PAC    Tyler Pita, MD  [facility] Direct Dial: [phone]  Fax: [phone] [email or web address]  Skype  [email or web address]    This document serves as a record of services personally performed by Tyler Pita, MD and Shona Simpson, PA-C. It was created on their behalf by Arlyce Harman, a trained medical scribe. The creation of this record is based on the scribe's personal observations and the provider's statements to them. This document has been checked and approved by the attending provider.

## 2017-08-29 NOTE — Progress Notes (Signed)
Please see the Nurse Progress Note in the MD Initial Consult Encounter for this patient. 

## 2017-08-29 NOTE — Progress Notes (Addendum)
GU Location of Tumor / Histology: intermediate risk prostatic adenocarcinoma  If Prostate Cancer, Gleason Score is (3 + 4) and PSA is (4.59). Prostate volume: 38 grams  09/2013 PSA  1.4 03/2015  PSA  2.3 04/2016  PSA  4.55 completed two week course of Cipro 05/2016  PSA  4.65  Biopsies of prostate (if applicable) revealed:    Past/Anticipated interventions by urology, if any: prostate biopsy, CT abdomen (negative), referral to Dr. Tammi Klippel to discuss IMRT plus ADT   Past/Anticipated interventions by medical oncology, if any: no  Weight changes, if any: no  Bowel/Bladder complaints, if any: frequency, urgency, nocturia and ED   Nausea/Vomiting, if any: yes  Pain issues, if any:    SAFETY ISSUES:  Prior radiation? NO  Pacemaker/ICD? NO  Is the patient on methotrexate? NO  Current Complaints / other details:  71 year old male. NKA. Married with one son. Lives in [place]. 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 Father - prostate cancer.  Allergies:NKA

## 2017-08-29 NOTE — Progress Notes (Signed)
Introduced myself as the prostate nurse navigator and my role to Mr. Rivest and his wife. Mr. Frerking states he had 11/12 positive biopsy cores. He understands he might be a candidate for brachytherapy or external beam treatments. He is here today to learn about these options. I gave them my business card and asked them to call with questions or concerns.

## 2017-08-31 DIAGNOSIS — C61 Malignant neoplasm of prostate: Secondary | ICD-10-CM | POA: Insufficient documentation

## 2017-09-12 ENCOUNTER — Telehealth: Payer: Self-pay | Admitting: *Deleted

## 2017-09-12 DIAGNOSIS — C61 Malignant neoplasm of prostate: Secondary | ICD-10-CM | POA: Diagnosis not present

## 2017-09-12 NOTE — Telephone Encounter (Signed)
Pt. had ADT inj. and gold fid. Markers on 09-12-17 @ Dr. Lyndal Rainbow Office and his sim on1-18-19 @ 9 am @ Dr. Johny Shears Office, spoke with patient and he is aware of this appt.

## 2017-10-31 DIAGNOSIS — Z809 Family history of malignant neoplasm, unspecified: Secondary | ICD-10-CM | POA: Diagnosis not present

## 2017-10-31 DIAGNOSIS — Z79899 Other long term (current) drug therapy: Secondary | ICD-10-CM | POA: Diagnosis not present

## 2017-10-31 DIAGNOSIS — Z808 Family history of malignant neoplasm of other organs or systems: Secondary | ICD-10-CM | POA: Diagnosis not present

## 2017-10-31 DIAGNOSIS — Z8673 Personal history of transient ischemic attack (TIA), and cerebral infarction without residual deficits: Secondary | ICD-10-CM | POA: Diagnosis not present

## 2017-10-31 DIAGNOSIS — Z8249 Family history of ischemic heart disease and other diseases of the circulatory system: Secondary | ICD-10-CM | POA: Diagnosis not present

## 2017-10-31 DIAGNOSIS — Z510 Encounter for antineoplastic radiation therapy: Secondary | ICD-10-CM | POA: Diagnosis not present

## 2017-10-31 DIAGNOSIS — I10 Essential (primary) hypertension: Secondary | ICD-10-CM | POA: Diagnosis not present

## 2017-10-31 DIAGNOSIS — G43909 Migraine, unspecified, not intractable, without status migrainosus: Secondary | ICD-10-CM | POA: Diagnosis not present

## 2017-10-31 DIAGNOSIS — C61 Malignant neoplasm of prostate: Secondary | ICD-10-CM | POA: Diagnosis not present

## 2017-11-18 ENCOUNTER — Encounter: Payer: Self-pay | Admitting: Medical Oncology

## 2017-11-18 ENCOUNTER — Ambulatory Visit
Admission: RE | Admit: 2017-11-18 | Discharge: 2017-11-18 | Disposition: A | Discharge status: Home / Self Care | Payer: Medicare HMO | Source: Ambulatory Visit | Attending: Radiation Oncology | Admitting: Radiation Oncology

## 2017-11-18 DIAGNOSIS — C61 Malignant neoplasm of prostate: Secondary | ICD-10-CM

## 2017-11-18 DIAGNOSIS — Z808 Family history of malignant neoplasm of other organs or systems: Secondary | ICD-10-CM | POA: Diagnosis not present

## 2017-11-18 DIAGNOSIS — I10 Essential (primary) hypertension: Secondary | ICD-10-CM | POA: Diagnosis not present

## 2017-11-18 DIAGNOSIS — Z79899 Other long term (current) drug therapy: Secondary | ICD-10-CM | POA: Diagnosis not present

## 2017-11-18 DIAGNOSIS — Z510 Encounter for antineoplastic radiation therapy: Secondary | ICD-10-CM | POA: Diagnosis not present

## 2017-11-18 DIAGNOSIS — G43909 Migraine, unspecified, not intractable, without status migrainosus: Secondary | ICD-10-CM | POA: Diagnosis not present

## 2017-11-18 DIAGNOSIS — Z809 Family history of malignant neoplasm, unspecified: Secondary | ICD-10-CM | POA: Diagnosis not present

## 2017-11-18 DIAGNOSIS — Z8249 Family history of ischemic heart disease and other diseases of the circulatory system: Secondary | ICD-10-CM | POA: Diagnosis not present

## 2017-11-18 DIAGNOSIS — Z8673 Personal history of transient ischemic attack (TIA), and cerebral infarction without residual deficits: Secondary | ICD-10-CM | POA: Diagnosis not present

## 2017-11-18 NOTE — Progress Notes (Signed)
  Radiation Oncology         [phone] ________________________________  Name: Randy Myers MRN: [id number]  Date: 11/18/2017  DOB: 07-20-1946  SIMULATION AND TREATMENT PLANNING NOTE    ICD-10-CM   1. Adenocarcinoma of prostate [facility]) C61     DIAGNOSIS:  72 y.o. gentleman with Stage T2a intermediate risk adenocarcinoma of the prostate with Gleason Score of 3+4, and PSA of 4.59  NARRATIVE:  The patient was brought to the [facility].  Identity was confirmed.  All relevant records and images related to the planned course of therapy were reviewed.  The patient freely provided informed written consent to proceed with treatment after reviewing the details related to the planned course of therapy. The consent form was witnessed and verified by the simulation staff.  Then, the patient was set-up in a stable reproducible supine position for radiation therapy.  A vacuum lock pillow device was custom fabricated to position his legs in a reproducible immobilized position.  Then, I performed a urethrogram under sterile conditions to identify the prostatic apex.  CT images were obtained.  Surface markings were placed.  The CT images were loaded into the planning software.  Then the prostate target and avoidance structures including the rectum, bladder, bowel and hips were contoured.  Treatment planning then occurred.  The radiation prescription was entered and confirmed.  A total of one complex treatment devices was fabricated. I have requested : Intensity Modulated Radiotherapy (IMRT) is medically necessary for this case for the following reason:  Rectal sparing.Marland Kitchen  PLAN:  The patient will receive 78 Gy in 40 fractions.  ________________________________  Sheral Apley Tammi Klippel, M.D.  This document serves as a record of services personally performed by Tyler Pita, MD. It was created on his behalf by Arlyce Harman, a trained medical scribe. The creation of this record is based on the  scribe's personal observations and the provider's statements to them. This document has been checked and approved by the attending provider.

## 2017-11-25 DIAGNOSIS — C61 Malignant neoplasm of prostate: Secondary | ICD-10-CM | POA: Diagnosis not present

## 2017-11-25 DIAGNOSIS — Z808 Family history of malignant neoplasm of other organs or systems: Secondary | ICD-10-CM | POA: Diagnosis not present

## 2017-11-25 DIAGNOSIS — Z809 Family history of malignant neoplasm, unspecified: Secondary | ICD-10-CM | POA: Diagnosis not present

## 2017-11-25 DIAGNOSIS — Z510 Encounter for antineoplastic radiation therapy: Secondary | ICD-10-CM | POA: Diagnosis not present

## 2017-11-25 DIAGNOSIS — Z8249 Family history of ischemic heart disease and other diseases of the circulatory system: Secondary | ICD-10-CM | POA: Diagnosis not present

## 2017-11-25 DIAGNOSIS — Z8673 Personal history of transient ischemic attack (TIA), and cerebral infarction without residual deficits: Secondary | ICD-10-CM | POA: Diagnosis not present

## 2017-11-25 DIAGNOSIS — G43909 Migraine, unspecified, not intractable, without status migrainosus: Secondary | ICD-10-CM | POA: Diagnosis not present

## 2017-11-25 DIAGNOSIS — I10 Essential (primary) hypertension: Secondary | ICD-10-CM | POA: Diagnosis not present

## 2017-11-25 DIAGNOSIS — Z79899 Other long term (current) drug therapy: Secondary | ICD-10-CM | POA: Diagnosis not present

## 2017-11-29 ENCOUNTER — Ambulatory Visit: Payer: Medicare HMO | Attending: Radiation Oncology

## 2017-11-29 DIAGNOSIS — C61 Malignant neoplasm of prostate: Secondary | ICD-10-CM | POA: Diagnosis not present

## 2017-11-29 DIAGNOSIS — Z510 Encounter for antineoplastic radiation therapy: Secondary | ICD-10-CM | POA: Diagnosis not present

## 2017-11-30 ENCOUNTER — Ambulatory Visit
Admission: RE | Admit: 2017-11-30 | Discharge: 2017-11-30 | Disposition: A | Discharge status: Home / Self Care | Payer: Medicare HMO | Source: Ambulatory Visit | Attending: Radiation Oncology | Admitting: Radiation Oncology

## 2017-11-30 DIAGNOSIS — Z510 Encounter for antineoplastic radiation therapy: Secondary | ICD-10-CM | POA: Diagnosis not present

## 2017-11-30 DIAGNOSIS — C61 Malignant neoplasm of prostate: Secondary | ICD-10-CM | POA: Diagnosis not present

## 2017-12-01 ENCOUNTER — Ambulatory Visit
Admission: RE | Admit: 2017-12-01 | Discharge: 2017-12-01 | Disposition: A | Discharge status: Home / Self Care | Payer: Medicare HMO | Source: Ambulatory Visit | Attending: Radiation Oncology | Admitting: Radiation Oncology

## 2017-12-01 DIAGNOSIS — Z510 Encounter for antineoplastic radiation therapy: Secondary | ICD-10-CM | POA: Diagnosis not present

## 2017-12-01 DIAGNOSIS — C61 Malignant neoplasm of prostate: Secondary | ICD-10-CM | POA: Diagnosis not present

## 2017-12-02 ENCOUNTER — Ambulatory Visit
Admission: RE | Admit: 2017-12-02 | Discharge: 2017-12-02 | Disposition: A | Discharge status: Home / Self Care | Payer: Medicare HMO | Source: Ambulatory Visit | Attending: Radiation Oncology | Admitting: Radiation Oncology

## 2017-12-02 DIAGNOSIS — C61 Malignant neoplasm of prostate: Secondary | ICD-10-CM | POA: Diagnosis not present

## 2017-12-02 DIAGNOSIS — Z510 Encounter for antineoplastic radiation therapy: Secondary | ICD-10-CM | POA: Insufficient documentation

## 2017-12-05 ENCOUNTER — Ambulatory Visit
Admission: RE | Admit: 2017-12-05 | Discharge: 2017-12-05 | Disposition: A | Discharge status: Home / Self Care | Payer: Medicare HMO | Source: Ambulatory Visit | Attending: Radiation Oncology | Admitting: Radiation Oncology

## 2017-12-05 DIAGNOSIS — C61 Malignant neoplasm of prostate: Secondary | ICD-10-CM | POA: Diagnosis not present

## 2017-12-05 DIAGNOSIS — Z510 Encounter for antineoplastic radiation therapy: Secondary | ICD-10-CM | POA: Diagnosis not present

## 2017-12-06 ENCOUNTER — Ambulatory Visit
Admission: RE | Admit: 2017-12-06 | Discharge: 2017-12-06 | Disposition: A | Discharge status: Home / Self Care | Payer: Medicare HMO | Source: Ambulatory Visit | Attending: Radiation Oncology | Admitting: Radiation Oncology

## 2017-12-06 DIAGNOSIS — Z510 Encounter for antineoplastic radiation therapy: Secondary | ICD-10-CM | POA: Diagnosis not present

## 2017-12-06 DIAGNOSIS — C61 Malignant neoplasm of prostate: Secondary | ICD-10-CM | POA: Diagnosis not present

## 2017-12-07 ENCOUNTER — Ambulatory Visit
Admission: RE | Admit: 2017-12-07 | Discharge: 2017-12-07 | Disposition: A | Discharge status: Home / Self Care | Payer: Medicare HMO | Source: Ambulatory Visit | Attending: Radiation Oncology | Admitting: Radiation Oncology

## 2017-12-07 DIAGNOSIS — C61 Malignant neoplasm of prostate: Secondary | ICD-10-CM | POA: Diagnosis not present

## 2017-12-07 DIAGNOSIS — Z510 Encounter for antineoplastic radiation therapy: Secondary | ICD-10-CM | POA: Diagnosis not present

## 2017-12-08 ENCOUNTER — Inpatient Hospital Stay
Admission: RE | Admit: 2017-12-08 | Discharge: 2017-12-08 | Disposition: A | Discharge status: Home / Self Care | Payer: Self-pay | Source: Ambulatory Visit | Attending: Radiation Oncology | Admitting: Radiation Oncology

## 2017-12-08 DIAGNOSIS — C61 Malignant neoplasm of prostate: Secondary | ICD-10-CM | POA: Diagnosis not present

## 2017-12-08 DIAGNOSIS — Z510 Encounter for antineoplastic radiation therapy: Secondary | ICD-10-CM | POA: Diagnosis not present

## 2017-12-09 ENCOUNTER — Inpatient Hospital Stay
Admission: RE | Admit: 2017-12-09 | Discharge: 2017-12-09 | Disposition: A | Discharge status: Home / Self Care | Payer: Self-pay | Source: Ambulatory Visit | Attending: Radiation Oncology | Admitting: Radiation Oncology

## 2017-12-09 DIAGNOSIS — C61 Malignant neoplasm of prostate: Secondary | ICD-10-CM | POA: Diagnosis not present

## 2017-12-09 DIAGNOSIS — Z510 Encounter for antineoplastic radiation therapy: Secondary | ICD-10-CM | POA: Diagnosis not present

## 2017-12-12 ENCOUNTER — Ambulatory Visit
Admission: RE | Admit: 2017-12-12 | Discharge: 2017-12-12 | Disposition: A | Discharge status: Home / Self Care | Payer: Medicare HMO | Source: Ambulatory Visit | Attending: Radiation Oncology | Admitting: Radiation Oncology

## 2017-12-12 ENCOUNTER — Encounter: Payer: Self-pay | Admitting: Medical Oncology

## 2017-12-12 DIAGNOSIS — Z510 Encounter for antineoplastic radiation therapy: Secondary | ICD-10-CM | POA: Diagnosis not present

## 2017-12-12 DIAGNOSIS — C61 Malignant neoplasm of prostate: Secondary | ICD-10-CM | POA: Diagnosis not present

## 2017-12-13 ENCOUNTER — Ambulatory Visit
Admission: RE | Admit: 2017-12-13 | Discharge: 2017-12-13 | Disposition: A | Discharge status: Home / Self Care | Payer: Medicare HMO | Source: Ambulatory Visit | Attending: Radiation Oncology | Admitting: Radiation Oncology

## 2017-12-13 DIAGNOSIS — C61 Malignant neoplasm of prostate: Secondary | ICD-10-CM | POA: Diagnosis not present

## 2017-12-13 DIAGNOSIS — Z510 Encounter for antineoplastic radiation therapy: Secondary | ICD-10-CM | POA: Diagnosis not present

## 2017-12-14 ENCOUNTER — Ambulatory Visit
Admission: RE | Admit: 2017-12-14 | Discharge: 2017-12-14 | Disposition: A | Discharge status: Home / Self Care | Payer: Medicare HMO | Source: Ambulatory Visit | Attending: Radiation Oncology | Admitting: Radiation Oncology

## 2017-12-14 DIAGNOSIS — C61 Malignant neoplasm of prostate: Secondary | ICD-10-CM | POA: Diagnosis not present

## 2017-12-14 DIAGNOSIS — Z510 Encounter for antineoplastic radiation therapy: Secondary | ICD-10-CM | POA: Diagnosis not present

## 2017-12-15 ENCOUNTER — Ambulatory Visit
Admission: RE | Admit: 2017-12-15 | Discharge: 2017-12-15 | Disposition: A | Discharge status: Home / Self Care | Payer: Medicare HMO | Source: Ambulatory Visit | Attending: Radiation Oncology | Admitting: Radiation Oncology

## 2017-12-15 DIAGNOSIS — C61 Malignant neoplasm of prostate: Secondary | ICD-10-CM | POA: Diagnosis not present

## 2017-12-15 DIAGNOSIS — Z510 Encounter for antineoplastic radiation therapy: Secondary | ICD-10-CM | POA: Diagnosis not present

## 2017-12-16 ENCOUNTER — Ambulatory Visit
Admission: RE | Admit: 2017-12-16 | Discharge: 2017-12-16 | Disposition: A | Discharge status: Home / Self Care | Payer: Medicare HMO | Source: Ambulatory Visit | Attending: Radiation Oncology | Admitting: Radiation Oncology

## 2017-12-16 DIAGNOSIS — Z510 Encounter for antineoplastic radiation therapy: Secondary | ICD-10-CM | POA: Diagnosis not present

## 2017-12-16 DIAGNOSIS — C61 Malignant neoplasm of prostate: Secondary | ICD-10-CM | POA: Diagnosis not present

## 2017-12-19 ENCOUNTER — Ambulatory Visit
Admission: RE | Admit: 2017-12-19 | Discharge: 2017-12-19 | Disposition: A | Discharge status: Home / Self Care | Payer: Medicare HMO | Source: Ambulatory Visit | Attending: Radiation Oncology | Admitting: Radiation Oncology

## 2017-12-19 DIAGNOSIS — Z510 Encounter for antineoplastic radiation therapy: Secondary | ICD-10-CM | POA: Diagnosis not present

## 2017-12-19 DIAGNOSIS — C61 Malignant neoplasm of prostate: Secondary | ICD-10-CM | POA: Diagnosis not present

## 2017-12-20 ENCOUNTER — Ambulatory Visit
Admission: RE | Admit: 2017-12-20 | Discharge: 2017-12-20 | Disposition: A | Discharge status: Home / Self Care | Payer: Medicare HMO | Source: Ambulatory Visit | Attending: Radiation Oncology | Admitting: Radiation Oncology

## 2017-12-20 DIAGNOSIS — C61 Malignant neoplasm of prostate: Secondary | ICD-10-CM | POA: Diagnosis not present

## 2017-12-20 DIAGNOSIS — Z510 Encounter for antineoplastic radiation therapy: Secondary | ICD-10-CM | POA: Diagnosis not present

## 2017-12-21 ENCOUNTER — Ambulatory Visit
Admission: RE | Admit: 2017-12-21 | Discharge: 2017-12-21 | Disposition: A | Discharge status: Home / Self Care | Payer: Medicare HMO | Source: Ambulatory Visit | Attending: Radiation Oncology | Admitting: Radiation Oncology

## 2017-12-21 DIAGNOSIS — C61 Malignant neoplasm of prostate: Secondary | ICD-10-CM | POA: Diagnosis not present

## 2017-12-21 DIAGNOSIS — Z510 Encounter for antineoplastic radiation therapy: Secondary | ICD-10-CM | POA: Diagnosis not present

## 2017-12-22 ENCOUNTER — Ambulatory Visit
Admission: RE | Admit: 2017-12-22 | Discharge: 2017-12-22 | Disposition: A | Discharge status: Home / Self Care | Payer: Medicare HMO | Source: Ambulatory Visit | Attending: Radiation Oncology | Admitting: Radiation Oncology

## 2017-12-22 DIAGNOSIS — Z510 Encounter for antineoplastic radiation therapy: Secondary | ICD-10-CM | POA: Diagnosis not present

## 2017-12-22 DIAGNOSIS — C61 Malignant neoplasm of prostate: Secondary | ICD-10-CM | POA: Diagnosis not present

## 2017-12-23 ENCOUNTER — Ambulatory Visit
Admission: RE | Admit: 2017-12-23 | Discharge: 2017-12-23 | Disposition: A | Discharge status: Home / Self Care | Payer: Medicare HMO | Source: Ambulatory Visit | Attending: Radiation Oncology | Admitting: Radiation Oncology

## 2017-12-23 DIAGNOSIS — Z510 Encounter for antineoplastic radiation therapy: Secondary | ICD-10-CM | POA: Diagnosis not present

## 2017-12-23 DIAGNOSIS — C61 Malignant neoplasm of prostate: Secondary | ICD-10-CM | POA: Diagnosis not present

## 2017-12-26 ENCOUNTER — Ambulatory Visit
Admission: RE | Admit: 2017-12-26 | Discharge: 2017-12-26 | Disposition: A | Discharge status: Home / Self Care | Payer: Medicare HMO | Source: Ambulatory Visit | Attending: Radiation Oncology | Admitting: Radiation Oncology

## 2017-12-26 DIAGNOSIS — Z510 Encounter for antineoplastic radiation therapy: Secondary | ICD-10-CM | POA: Diagnosis not present

## 2017-12-26 DIAGNOSIS — C61 Malignant neoplasm of prostate: Secondary | ICD-10-CM | POA: Diagnosis not present

## 2017-12-27 ENCOUNTER — Ambulatory Visit
Admission: RE | Admit: 2017-12-27 | Discharge: 2017-12-27 | Disposition: A | Discharge status: Home / Self Care | Payer: Medicare HMO | Source: Ambulatory Visit | Attending: Radiation Oncology | Admitting: Radiation Oncology

## 2017-12-27 DIAGNOSIS — C61 Malignant neoplasm of prostate: Secondary | ICD-10-CM | POA: Diagnosis not present

## 2017-12-27 DIAGNOSIS — Z510 Encounter for antineoplastic radiation therapy: Secondary | ICD-10-CM | POA: Diagnosis not present

## 2017-12-28 ENCOUNTER — Ambulatory Visit
Admission: RE | Admit: 2017-12-28 | Discharge: 2017-12-28 | Disposition: A | Discharge status: Home / Self Care | Payer: Medicare HMO | Source: Ambulatory Visit | Attending: Radiation Oncology | Admitting: Radiation Oncology

## 2017-12-28 DIAGNOSIS — Z510 Encounter for antineoplastic radiation therapy: Secondary | ICD-10-CM | POA: Diagnosis not present

## 2017-12-28 DIAGNOSIS — C61 Malignant neoplasm of prostate: Secondary | ICD-10-CM | POA: Diagnosis not present

## 2017-12-29 ENCOUNTER — Ambulatory Visit
Admission: RE | Admit: 2017-12-29 | Discharge: 2017-12-29 | Disposition: A | Discharge status: Home / Self Care | Payer: Medicare HMO | Source: Ambulatory Visit | Attending: Radiation Oncology | Admitting: Radiation Oncology

## 2017-12-29 DIAGNOSIS — Z510 Encounter for antineoplastic radiation therapy: Secondary | ICD-10-CM | POA: Diagnosis not present

## 2017-12-29 DIAGNOSIS — C61 Malignant neoplasm of prostate: Secondary | ICD-10-CM | POA: Diagnosis not present

## 2017-12-30 ENCOUNTER — Ambulatory Visit
Admission: RE | Admit: 2017-12-30 | Discharge: 2017-12-30 | Disposition: A | Discharge status: Home / Self Care | Payer: Medicare HMO | Source: Ambulatory Visit | Attending: Radiation Oncology | Admitting: Radiation Oncology

## 2017-12-30 DIAGNOSIS — Z510 Encounter for antineoplastic radiation therapy: Secondary | ICD-10-CM | POA: Diagnosis not present

## 2017-12-30 DIAGNOSIS — C61 Malignant neoplasm of prostate: Secondary | ICD-10-CM | POA: Diagnosis not present

## 2018-01-02 ENCOUNTER — Ambulatory Visit
Admission: RE | Admit: 2018-01-02 | Discharge: 2018-01-02 | Disposition: A | Discharge status: Home / Self Care | Payer: Medicare HMO | Source: Ambulatory Visit | Attending: Radiation Oncology | Admitting: Radiation Oncology

## 2018-01-02 DIAGNOSIS — C61 Malignant neoplasm of prostate: Secondary | ICD-10-CM | POA: Diagnosis not present

## 2018-01-02 DIAGNOSIS — Z510 Encounter for antineoplastic radiation therapy: Secondary | ICD-10-CM | POA: Diagnosis not present

## 2018-01-03 ENCOUNTER — Ambulatory Visit
Admission: RE | Admit: 2018-01-03 | Discharge: 2018-01-03 | Disposition: A | Discharge status: Home / Self Care | Payer: Medicare HMO | Source: Ambulatory Visit | Attending: Radiation Oncology | Admitting: Radiation Oncology

## 2018-01-03 DIAGNOSIS — C61 Malignant neoplasm of prostate: Secondary | ICD-10-CM | POA: Diagnosis not present

## 2018-01-03 DIAGNOSIS — Z510 Encounter for antineoplastic radiation therapy: Secondary | ICD-10-CM | POA: Diagnosis not present

## 2018-01-04 ENCOUNTER — Ambulatory Visit
Admission: RE | Admit: 2018-01-04 | Discharge: 2018-01-04 | Disposition: A | Discharge status: Home / Self Care | Payer: Medicare HMO | Source: Ambulatory Visit | Attending: Radiation Oncology | Admitting: Radiation Oncology

## 2018-01-04 DIAGNOSIS — C61 Malignant neoplasm of prostate: Secondary | ICD-10-CM | POA: Diagnosis not present

## 2018-01-04 DIAGNOSIS — Z510 Encounter for antineoplastic radiation therapy: Secondary | ICD-10-CM | POA: Diagnosis not present

## 2018-01-05 ENCOUNTER — Ambulatory Visit
Admission: RE | Admit: 2018-01-05 | Discharge: 2018-01-05 | Disposition: A | Discharge status: Home / Self Care | Payer: Medicare HMO | Source: Ambulatory Visit | Attending: Radiation Oncology | Admitting: Radiation Oncology

## 2018-01-05 DIAGNOSIS — C61 Malignant neoplasm of prostate: Secondary | ICD-10-CM | POA: Diagnosis not present

## 2018-01-05 DIAGNOSIS — Z510 Encounter for antineoplastic radiation therapy: Secondary | ICD-10-CM | POA: Diagnosis not present

## 2018-01-06 ENCOUNTER — Ambulatory Visit
Admission: RE | Admit: 2018-01-06 | Discharge: 2018-01-06 | Disposition: A | Discharge status: Home / Self Care | Payer: Medicare HMO | Source: Ambulatory Visit | Attending: Radiation Oncology | Admitting: Radiation Oncology

## 2018-01-06 DIAGNOSIS — Z510 Encounter for antineoplastic radiation therapy: Secondary | ICD-10-CM | POA: Diagnosis not present

## 2018-01-06 DIAGNOSIS — C61 Malignant neoplasm of prostate: Secondary | ICD-10-CM | POA: Diagnosis not present

## 2018-01-09 ENCOUNTER — Ambulatory Visit
Admission: RE | Admit: 2018-01-09 | Discharge: 2018-01-09 | Disposition: A | Discharge status: Home / Self Care | Payer: Medicare HMO | Source: Ambulatory Visit | Attending: Radiation Oncology | Admitting: Radiation Oncology

## 2018-01-09 DIAGNOSIS — C61 Malignant neoplasm of prostate: Secondary | ICD-10-CM | POA: Diagnosis not present

## 2018-01-09 DIAGNOSIS — Z510 Encounter for antineoplastic radiation therapy: Secondary | ICD-10-CM | POA: Diagnosis not present

## 2018-01-10 ENCOUNTER — Ambulatory Visit
Admission: RE | Admit: 2018-01-10 | Discharge: 2018-01-10 | Disposition: A | Discharge status: Home / Self Care | Payer: Medicare HMO | Source: Ambulatory Visit | Attending: Radiation Oncology | Admitting: Radiation Oncology

## 2018-01-10 DIAGNOSIS — C61 Malignant neoplasm of prostate: Secondary | ICD-10-CM | POA: Diagnosis not present

## 2018-01-10 DIAGNOSIS — Z510 Encounter for antineoplastic radiation therapy: Secondary | ICD-10-CM | POA: Diagnosis not present

## 2018-01-11 ENCOUNTER — Ambulatory Visit
Admission: RE | Admit: 2018-01-11 | Discharge: 2018-01-11 | Disposition: A | Discharge status: Home / Self Care | Payer: Medicare HMO | Source: Ambulatory Visit | Attending: Radiation Oncology | Admitting: Radiation Oncology

## 2018-01-11 DIAGNOSIS — Z510 Encounter for antineoplastic radiation therapy: Secondary | ICD-10-CM | POA: Diagnosis not present

## 2018-01-12 ENCOUNTER — Encounter: Payer: Self-pay | Admitting: Medical Oncology

## 2018-01-12 ENCOUNTER — Ambulatory Visit
Admission: RE | Admit: 2018-01-12 | Discharge: 2018-01-12 | Disposition: A | Discharge status: Home / Self Care | Payer: Medicare HMO | Source: Ambulatory Visit | Attending: Radiation Oncology | Admitting: Radiation Oncology

## 2018-01-12 DIAGNOSIS — Z510 Encounter for antineoplastic radiation therapy: Secondary | ICD-10-CM | POA: Diagnosis not present

## 2018-01-12 DIAGNOSIS — C61 Malignant neoplasm of prostate: Secondary | ICD-10-CM | POA: Diagnosis not present

## 2018-01-13 ENCOUNTER — Ambulatory Visit
Admission: RE | Admit: 2018-01-13 | Discharge: 2018-01-13 | Disposition: A | Discharge status: Home / Self Care | Payer: Medicare HMO | Source: Ambulatory Visit | Attending: Radiation Oncology | Admitting: Radiation Oncology

## 2018-01-13 DIAGNOSIS — C61 Malignant neoplasm of prostate: Secondary | ICD-10-CM | POA: Diagnosis not present

## 2018-01-13 DIAGNOSIS — Z510 Encounter for antineoplastic radiation therapy: Secondary | ICD-10-CM | POA: Diagnosis not present

## 2018-01-16 ENCOUNTER — Ambulatory Visit
Admission: RE | Admit: 2018-01-16 | Discharge: 2018-01-16 | Disposition: A | Discharge status: Home / Self Care | Payer: Medicare HMO | Source: Ambulatory Visit | Attending: Radiation Oncology | Admitting: Radiation Oncology

## 2018-01-16 DIAGNOSIS — C61 Malignant neoplasm of prostate: Secondary | ICD-10-CM | POA: Diagnosis not present

## 2018-01-16 DIAGNOSIS — Z510 Encounter for antineoplastic radiation therapy: Secondary | ICD-10-CM | POA: Diagnosis not present

## 2018-01-17 ENCOUNTER — Ambulatory Visit
Admission: RE | Admit: 2018-01-17 | Discharge: 2018-01-17 | Disposition: A | Discharge status: Home / Self Care | Payer: Medicare HMO | Source: Ambulatory Visit | Attending: Radiation Oncology | Admitting: Radiation Oncology

## 2018-01-17 DIAGNOSIS — C61 Malignant neoplasm of prostate: Secondary | ICD-10-CM | POA: Diagnosis not present

## 2018-01-17 DIAGNOSIS — Z510 Encounter for antineoplastic radiation therapy: Secondary | ICD-10-CM | POA: Diagnosis not present

## 2018-01-18 ENCOUNTER — Ambulatory Visit
Admission: RE | Admit: 2018-01-18 | Discharge: 2018-01-18 | Disposition: A | Discharge status: Home / Self Care | Payer: Medicare HMO | Source: Ambulatory Visit | Attending: Radiation Oncology | Admitting: Radiation Oncology

## 2018-01-18 DIAGNOSIS — Z510 Encounter for antineoplastic radiation therapy: Secondary | ICD-10-CM | POA: Diagnosis not present

## 2018-01-18 DIAGNOSIS — C61 Malignant neoplasm of prostate: Secondary | ICD-10-CM | POA: Diagnosis not present

## 2018-01-19 ENCOUNTER — Ambulatory Visit
Admission: RE | Admit: 2018-01-19 | Discharge: 2018-01-19 | Disposition: A | Discharge status: Home / Self Care | Payer: Medicare HMO | Source: Ambulatory Visit | Attending: Radiation Oncology | Admitting: Radiation Oncology

## 2018-01-19 DIAGNOSIS — Z510 Encounter for antineoplastic radiation therapy: Secondary | ICD-10-CM | POA: Diagnosis not present

## 2018-01-19 DIAGNOSIS — C61 Malignant neoplasm of prostate: Secondary | ICD-10-CM | POA: Diagnosis not present

## 2018-01-20 ENCOUNTER — Ambulatory Visit
Admission: RE | Admit: 2018-01-20 | Discharge: 2018-01-20 | Disposition: A | Discharge status: Home / Self Care | Payer: Medicare HMO | Source: Ambulatory Visit | Attending: Radiation Oncology | Admitting: Radiation Oncology

## 2018-01-20 DIAGNOSIS — Z510 Encounter for antineoplastic radiation therapy: Secondary | ICD-10-CM | POA: Diagnosis not present

## 2018-01-20 DIAGNOSIS — C61 Malignant neoplasm of prostate: Secondary | ICD-10-CM | POA: Diagnosis not present

## 2018-01-23 ENCOUNTER — Ambulatory Visit
Admission: RE | Admit: 2018-01-23 | Discharge: 2018-01-23 | Disposition: A | Discharge status: Home / Self Care | Payer: Medicare HMO | Source: Ambulatory Visit | Attending: Radiation Oncology | Admitting: Radiation Oncology

## 2018-01-23 DIAGNOSIS — Z510 Encounter for antineoplastic radiation therapy: Secondary | ICD-10-CM | POA: Diagnosis not present

## 2018-01-23 DIAGNOSIS — C61 Malignant neoplasm of prostate: Secondary | ICD-10-CM | POA: Diagnosis not present

## 2018-01-24 ENCOUNTER — Encounter: Payer: Self-pay | Admitting: Radiation Oncology

## 2018-01-24 NOTE — Progress Notes (Signed)
  Radiation Oncology         [phone] ________________________________  Name: Randy Myers MRN: [id number]  Date: 01/24/2018  DOB: 08-13-1946  End of Treatment Note  Diagnosis: 72 y.o. gentleman with Stage T2a intermediate risk adenocarcinoma of the prostate with Gleason Score of 3+4, and PSA of 4.59     Indication for treatment: Curative       Radiation treatment dates:  11/29/17-01/23/18  Site/dose:  Prostate/ 78 Gy in 40 fractions  Beams/energy:  IMRT/ 6X  Narrative: The patient tolerated radiation treatment relatively well. During treatment, he complained of nocturia x 3-4, urinary urgency, and weak stream.  He felt he was able to empty his bladder completely on voiding and denied hematuria, dysuria or straining to void.  He did experience modest fatigue.  He saw a scant amount of blood on toilet tissue with wiping with a single BM in the last 2 weeks of treatment but this resolved spontaneously and did not recur.  He noticed increased frequency of BMs with small, soft stools but denied diarrhea.  Plan: The patient has completed radiation treatment. The patient will return to radiation oncology clinic for routine followup in one month. I advised him to call or return sooner if he has any questions or concerns related to his recovery or treatment. ________________________________  Sheral Apley. Tammi Klippel, M.D.   This document serves as a record of services personally performed by Tyler Pita, MD. It was created on his behalf by Bethann Humble, a trained medical scribe. The creation of this record is based on the scribe's personal observations and the provider's statements to them. This document has been checked and approved by the attending provider.

## 2018-02-28 ENCOUNTER — Ambulatory Visit
Admission: RE | Admit: 2018-02-28 | Discharge: 2018-02-28 | Disposition: A | Discharge status: Home / Self Care | Payer: Medicare HMO | Source: Ambulatory Visit | Attending: Urology | Admitting: Urology

## 2018-02-28 ENCOUNTER — Other Ambulatory Visit: Payer: Self-pay

## 2018-02-28 ENCOUNTER — Encounter: Payer: Self-pay | Admitting: Urology

## 2018-02-28 VITALS — BP 120/67 | HR 86 | Temp 97.8°F | Resp 18 | Ht <= 58 in | Wt 220.0 lb

## 2018-02-28 DIAGNOSIS — R0602 Shortness of breath: Secondary | ICD-10-CM | POA: Diagnosis not present

## 2018-02-28 DIAGNOSIS — Z923 Personal history of irradiation: Secondary | ICD-10-CM | POA: Diagnosis not present

## 2018-02-28 DIAGNOSIS — Z79899 Other long term (current) drug therapy: Secondary | ICD-10-CM | POA: Insufficient documentation

## 2018-02-28 DIAGNOSIS — C61 Malignant neoplasm of prostate: Secondary | ICD-10-CM | POA: Diagnosis not present

## 2018-02-28 NOTE — Progress Notes (Signed)
Radiation Oncology         [phone] ________________________________  Name: Randy Myers MRN: [id number]  Date: 02/28/2018  DOB: 02-05-1946  Post Treatment Note  CC: [name], MD  Festus Aloe, MD  Diagnosis:   72 y.o.gentleman with Stage T2a intermediateriskadenocarcinoma of the prostate with Gleason Score of 3+4, and PSA of4.59.    Interval Since Last Radiation:  5 weeks  11/29/17-01/23/18:  Prostate/ 78 Gy in 40 fractions  Narrative:  The patient returns today for routine follow-up.  He tolerated radiation treatment relatively well. During treatment, he complained of nocturia x 3-4, urinary urgency, and weak stream.  He felt he was able to empty his bladder completely on voiding and denied hematuria, dysuria or straining to void.  He did experience modest fatigue.  He saw a scant amount of blood on toilet tissue with wiping with a single BM in the last 2 weeks of treatment but this resolved spontaneously and did not recur.  He noticed increased frequency of BMs with small, soft stools but denied diarrhea.  He remained on Lupron for ADT (started 09/12/18) throughout his course of radiotherapy.  On review of systems, the patient states that he is doing well overall.  He continues with hot flashes and fatigue secondary to his ADT.  He also consent continues with persistent urgency and nocturia x3 per night.  He denies excessive daytime frequency, weak stream, straining or incomplete emptying.  His current IPSS score is 13 indicating moderate urinary symptoms.  He does feel that his urinary symptoms are gradually improving.  He specifically denies dysuria or gross hematuria.  He reports a healthy appetite and is maintaining his weight.  He denies abdominal pain, nausea, vomiting or diarrhea.  He does mention that he has had some shortness of breath with exertion over the past 4 to 5 months but denies any progressive worsening.  He denies associated chest pain or hemoptysis.  He  is not short of breath at rest and denies productive cough.  He does not have a history of asthma or COPD to his knowledge.  He does admit to having occasional regurgitation to relieve epigastric pressure.  This has been going on for the past 3 to 4 months and he has not used any medication or sought any treatment for this.  ALLERGIES:  has No Known Allergies.  Meds: Current Outpatient Medications  Medication Sig Dispense Refill  . acetaminophen (TYLENOL) 500 MG tablet Take 500 mg by mouth every 8 (eight) hours as needed for mild pain.    Marland Kitchen atorvastatin (LIPITOR) 10 MG tablet Take 1 tablet (10 mg total) by mouth daily. 90 tablet 3  . clopidogrel (PLAVIX) 75 MG tablet Take 1 tablet (75 mg total) by mouth daily with breakfast. 30 tablet 3  . lisinopril (PRINIVIL,ZESTRIL) 5 MG tablet Take 5 mg by mouth daily.     . Multiple Vitamin (MULTIVITAMIN WITH MINERALS) TABS tablet Take 1 tablet by mouth daily. One a Day    . ketoconazole (NIZORAL) 2 % cream Apply 1 application topically daily. (Patient not taking: Reported on 08/29/2017) 60 g 2   No current facility-administered medications for this encounter.     Physical Findings:  height is 6" (0.152 m) (abnormal) and weight is 220 lb (99.8 kg). His oral temperature is 97.8 F (36.6 C). His blood pressure is 120/67 and his pulse is 86. His respiration is 18 and oxygen saturation is 97%.  Pain Assessment Pain Score: 3  Pain Loc: Abdomen/10  In general this is a well appearing Caucasian male in no acute distress.  He's alert and oriented x4 and appropriate throughout the examination. Cardiopulmonary assessment is negative for acute distress and he exhibits normal effort.   Lab Findings: Lab Results  Component Value Date   WBC 7.3 06/11/2013   HGB 15.1 06/11/2013   HCT 42.6 06/11/2013   MCV 83.9 06/11/2013   PLT 165 06/11/2013     Radiographic Findings: No results found.  Impression/Plan: 1. 72 y.o.gentleman with Stage T2a  intermediateriskadenocarcinoma of the prostate with Gleason Score of 3+4, and PSA of4.59.   He will continue to follow up with urology for ongoing PSA determinations and has an appointment scheduled with Dr. Junious Silk on 03/13/2018.  He remains on Lupron for ADT and anticipates completing a 1-month course of this therapy.  He understands what to expect with regards to PSA monitoring going forward. I will look forward to following his response to treatment via correspondence with urology, and would be happy to continue to participate in his care if clinically indicated. I talked to the patient about what to expect in the future, including his risk for erectile dysfunction and rectal bleeding. I encouraged him to call or return to the office if he has any questions regarding his previous radiation or possible radiation side effects. He was comfortable with this plan and will follow up as needed. 2. Epigastric pressure with regurgitation.  This could be related to esophageal stricture or reflux.  Advised him to follow-up with his primary care provider to have this evaluated further. 3. Shortness of breath on exertion.  I advised that this could be related to progressive heart disease and less likely related to pulmonary disease.  Again, I advised him to follow-up with his primary care provider for further evaluation and treatment.    Nicholos Johns, PA-C

## 2018-03-07 DIAGNOSIS — C61 Malignant neoplasm of prostate: Secondary | ICD-10-CM | POA: Diagnosis not present

## 2018-03-13 DIAGNOSIS — R3915 Urgency of urination: Secondary | ICD-10-CM | POA: Diagnosis not present

## 2018-03-13 DIAGNOSIS — C61 Malignant neoplasm of prostate: Secondary | ICD-10-CM | POA: Diagnosis not present

## 2018-05-10 ENCOUNTER — Other Ambulatory Visit: Payer: Self-pay | Admitting: Family Medicine

## 2018-05-10 ENCOUNTER — Ambulatory Visit
Admission: RE | Admit: 2018-05-10 | Discharge: 2018-05-10 | Disposition: A | Discharge status: Home / Self Care | Payer: Medicare HMO | Source: Ambulatory Visit | Attending: Family Medicine | Admitting: Family Medicine

## 2018-05-10 DIAGNOSIS — E7800 Pure hypercholesterolemia, unspecified: Secondary | ICD-10-CM | POA: Diagnosis not present

## 2018-05-10 DIAGNOSIS — R0602 Shortness of breath: Secondary | ICD-10-CM

## 2018-05-10 DIAGNOSIS — K219 Gastro-esophageal reflux disease without esophagitis: Secondary | ICD-10-CM | POA: Diagnosis not present

## 2018-05-10 DIAGNOSIS — Z8673 Personal history of transient ischemic attack (TIA), and cerebral infarction without residual deficits: Secondary | ICD-10-CM | POA: Diagnosis not present

## 2018-05-10 DIAGNOSIS — R739 Hyperglycemia, unspecified: Secondary | ICD-10-CM | POA: Diagnosis not present

## 2018-05-10 DIAGNOSIS — C61 Malignant neoplasm of prostate: Secondary | ICD-10-CM | POA: Diagnosis not present

## 2018-05-10 DIAGNOSIS — Z1389 Encounter for screening for other disorder: Secondary | ICD-10-CM | POA: Diagnosis not present

## 2018-05-10 DIAGNOSIS — I10 Essential (primary) hypertension: Secondary | ICD-10-CM | POA: Diagnosis not present

## 2018-05-10 DIAGNOSIS — M2042 Other hammer toe(s) (acquired), left foot: Secondary | ICD-10-CM | POA: Diagnosis not present

## 2018-05-10 DIAGNOSIS — Z0000 Encounter for general adult medical examination without abnormal findings: Secondary | ICD-10-CM | POA: Diagnosis not present

## 2018-05-10 DIAGNOSIS — Z91018 Allergy to other foods: Secondary | ICD-10-CM | POA: Diagnosis not present

## 2018-05-10 DIAGNOSIS — R0609 Other forms of dyspnea: Secondary | ICD-10-CM | POA: Diagnosis not present

## 2018-06-19 DIAGNOSIS — M2022 Hallux rigidus, left foot: Secondary | ICD-10-CM | POA: Diagnosis not present

## 2018-06-19 DIAGNOSIS — M21612 Bunion of left foot: Secondary | ICD-10-CM | POA: Diagnosis not present

## 2018-06-19 DIAGNOSIS — M21611 Bunion of right foot: Secondary | ICD-10-CM | POA: Diagnosis not present

## 2018-06-19 DIAGNOSIS — M2042 Other hammer toe(s) (acquired), left foot: Secondary | ICD-10-CM | POA: Diagnosis not present

## 2018-06-19 DIAGNOSIS — M2041 Other hammer toe(s) (acquired), right foot: Secondary | ICD-10-CM | POA: Diagnosis not present

## 2018-06-19 DIAGNOSIS — M79671 Pain in right foot: Secondary | ICD-10-CM | POA: Diagnosis not present

## 2018-06-19 DIAGNOSIS — M2021 Hallux rigidus, right foot: Secondary | ICD-10-CM | POA: Diagnosis not present

## 2018-06-19 DIAGNOSIS — M79672 Pain in left foot: Secondary | ICD-10-CM | POA: Diagnosis not present

## 2018-07-12 DIAGNOSIS — M25571 Pain in right ankle and joints of right foot: Secondary | ICD-10-CM | POA: Diagnosis not present

## 2018-07-18 DIAGNOSIS — M2021 Hallux rigidus, right foot: Secondary | ICD-10-CM | POA: Diagnosis not present

## 2018-07-18 DIAGNOSIS — M7741 Metatarsalgia, right foot: Secondary | ICD-10-CM | POA: Diagnosis not present

## 2018-07-18 DIAGNOSIS — G8918 Other acute postprocedural pain: Secondary | ICD-10-CM | POA: Diagnosis not present

## 2018-07-18 DIAGNOSIS — M2041 Other hammer toe(s) (acquired), right foot: Secondary | ICD-10-CM | POA: Diagnosis not present

## 2018-07-18 DIAGNOSIS — M21611 Bunion of right foot: Secondary | ICD-10-CM | POA: Diagnosis not present

## 2018-08-03 DIAGNOSIS — Z23 Encounter for immunization: Secondary | ICD-10-CM | POA: Diagnosis not present

## 2018-09-01 DIAGNOSIS — Z5189 Encounter for other specified aftercare: Secondary | ICD-10-CM | POA: Diagnosis not present

## 2018-09-01 DIAGNOSIS — M79671 Pain in right foot: Secondary | ICD-10-CM | POA: Diagnosis not present

## 2018-09-01 DIAGNOSIS — M2021 Hallux rigidus, right foot: Secondary | ICD-10-CM | POA: Diagnosis not present

## 2018-09-01 DIAGNOSIS — M2041 Other hammer toe(s) (acquired), right foot: Secondary | ICD-10-CM | POA: Diagnosis not present

## 2018-09-01 DIAGNOSIS — M21612 Bunion of left foot: Secondary | ICD-10-CM | POA: Diagnosis not present

## 2018-09-01 DIAGNOSIS — M2022 Hallux rigidus, left foot: Secondary | ICD-10-CM | POA: Diagnosis not present

## 2018-09-01 DIAGNOSIS — M2042 Other hammer toe(s) (acquired), left foot: Secondary | ICD-10-CM | POA: Diagnosis not present

## 2018-09-01 DIAGNOSIS — M21611 Bunion of right foot: Secondary | ICD-10-CM | POA: Diagnosis not present

## 2018-09-04 DIAGNOSIS — C61 Malignant neoplasm of prostate: Secondary | ICD-10-CM | POA: Diagnosis not present

## 2018-09-15 DIAGNOSIS — C61 Malignant neoplasm of prostate: Secondary | ICD-10-CM | POA: Diagnosis not present

## 2018-09-15 DIAGNOSIS — R3915 Urgency of urination: Secondary | ICD-10-CM | POA: Diagnosis not present

## 2018-10-05 DIAGNOSIS — M2042 Other hammer toe(s) (acquired), left foot: Secondary | ICD-10-CM | POA: Diagnosis not present

## 2018-10-05 DIAGNOSIS — M2021 Hallux rigidus, right foot: Secondary | ICD-10-CM | POA: Diagnosis not present

## 2018-10-05 DIAGNOSIS — M79671 Pain in right foot: Secondary | ICD-10-CM | POA: Diagnosis not present

## 2018-10-05 DIAGNOSIS — M2041 Other hammer toe(s) (acquired), right foot: Secondary | ICD-10-CM | POA: Diagnosis not present

## 2018-10-05 DIAGNOSIS — M2022 Hallux rigidus, left foot: Secondary | ICD-10-CM | POA: Diagnosis not present

## 2018-10-05 DIAGNOSIS — M21611 Bunion of right foot: Secondary | ICD-10-CM | POA: Diagnosis not present

## 2018-10-05 DIAGNOSIS — M21612 Bunion of left foot: Secondary | ICD-10-CM | POA: Diagnosis not present

## 2019-03-12 DIAGNOSIS — C61 Malignant neoplasm of prostate: Secondary | ICD-10-CM | POA: Diagnosis not present

## 2019-03-19 DIAGNOSIS — R350 Frequency of micturition: Secondary | ICD-10-CM | POA: Diagnosis not present

## 2019-03-19 DIAGNOSIS — Z8546 Personal history of malignant neoplasm of prostate: Secondary | ICD-10-CM | POA: Diagnosis not present

## 2019-03-19 DIAGNOSIS — N5201 Erectile dysfunction due to arterial insufficiency: Secondary | ICD-10-CM | POA: Diagnosis not present

## 2019-04-27 ENCOUNTER — Emergency Department (HOSPITAL_COMMUNITY): Payer: Medicare HMO

## 2019-04-27 ENCOUNTER — Other Ambulatory Visit: Payer: Self-pay

## 2019-04-27 ENCOUNTER — Emergency Department (HOSPITAL_COMMUNITY)
Admission: EM | Admit: 2019-04-27 | Discharge: 2019-04-27 | Disposition: A | Discharge status: Home / Self Care | Payer: Medicare HMO | Attending: Emergency Medicine | Admitting: Emergency Medicine

## 2019-04-27 ENCOUNTER — Encounter (HOSPITAL_COMMUNITY): Payer: Self-pay

## 2019-04-27 DIAGNOSIS — I10 Essential (primary) hypertension: Secondary | ICD-10-CM | POA: Diagnosis not present

## 2019-04-27 DIAGNOSIS — W228XXA Striking against or struck by other objects, initial encounter: Secondary | ICD-10-CM | POA: Diagnosis not present

## 2019-04-27 DIAGNOSIS — Z23 Encounter for immunization: Secondary | ICD-10-CM | POA: Diagnosis not present

## 2019-04-27 DIAGNOSIS — Y929 Unspecified place or not applicable: Secondary | ICD-10-CM | POA: Insufficient documentation

## 2019-04-27 DIAGNOSIS — Y999 Unspecified external cause status: Secondary | ICD-10-CM | POA: Insufficient documentation

## 2019-04-27 DIAGNOSIS — H2101 Hyphema, right eye: Secondary | ICD-10-CM | POA: Diagnosis not present

## 2019-04-27 DIAGNOSIS — H4311 Vitreous hemorrhage, right eye: Secondary | ICD-10-CM | POA: Diagnosis not present

## 2019-04-27 DIAGNOSIS — S058X1A Other injuries of right eye and orbit, initial encounter: Secondary | ICD-10-CM | POA: Diagnosis present

## 2019-04-27 DIAGNOSIS — S0511XA Contusion of eyeball and orbital tissues, right eye, initial encounter: Secondary | ICD-10-CM | POA: Diagnosis not present

## 2019-04-27 DIAGNOSIS — Z79899 Other long term (current) drug therapy: Secondary | ICD-10-CM | POA: Diagnosis not present

## 2019-04-27 DIAGNOSIS — Y9389 Activity, other specified: Secondary | ICD-10-CM | POA: Insufficient documentation

## 2019-04-27 DIAGNOSIS — H1131 Conjunctival hemorrhage, right eye: Secondary | ICD-10-CM | POA: Diagnosis not present

## 2019-04-27 DIAGNOSIS — S0591XA Unspecified injury of right eye and orbit, initial encounter: Secondary | ICD-10-CM | POA: Diagnosis not present

## 2019-04-27 MED ORDER — ATROPINE SULFATE 1 % OP SOLN: 1.0000 [drp] | Freq: Two times a day (BID) | OPHTHALMIC | 0 refills | Status: AC

## 2019-04-27 MED ORDER — FLUORESCEIN SODIUM 1 MG OP STRP
1.0000 | ORAL_STRIP | Freq: Once | OPHTHALMIC | Status: DC
  Filled 2019-04-27: qty 1

## 2019-04-27 MED ORDER — PROPARACAINE HCL 0.5 % OP SOLN
1.0000 [drp] | Freq: Once | OPHTHALMIC | Status: DC
  Filled 2019-04-27: qty 15

## 2019-04-27 MED ORDER — PREDNISOLONE ACETATE 1 % OP SUSP: 1.0000 [drp] | Freq: Four times a day (QID) | OPHTHALMIC | 0 refills | Status: DC

## 2019-04-27 MED ORDER — TETANUS-DIPHTH-ACELL PERTUSSIS 5-2.5-18.5 LF-MCG/0.5 IM SUSP
0.5000 mL | Freq: Once | INTRAMUSCULAR | Status: AC
  Administered 2019-04-27: 15:00:00 0.5 mL via INTRAMUSCULAR
  Filled 2019-04-27: qty 0.5

## 2019-04-27 MED ORDER — ONDANSETRON HCL 4 MG/2ML IJ SOLN
4.0000 mg | Freq: Once | INTRAMUSCULAR | Status: AC
  Administered 2019-04-27: 4 mg via INTRAVENOUS
  Filled 2019-04-27: qty 2

## 2019-04-27 NOTE — ED Triage Notes (Signed)
Patient states he was using a new chainsaw and a piece flew off of the chainsaw blade and his his right eye. Patient states he can not see out of his right eye and also c/o headache. Patient is taking a blood thinner.

## 2019-04-27 NOTE — ED Provider Notes (Signed)
[facility] DEPT Provider Note   CSN: [id number] Arrival date & time: 04/27/19  1218    History   Chief Complaint Chief Complaint  Patient presents with  . Eye Injury    HPI Randy Myers is a 73 y.o. male.     73 year old male with past medical history below including CVA, prostate cancer, hypertension who presents with right eye injury.  Just prior to arrival, the patient was fixing a chain saw when a piece of likely cast iron flew up into his right eye.  He has had total vision loss in his right eye since then.  His pain in his eye is minimal but it has caused him to have a headache.  He denies any other areas of injury.  He last ate this morning.  No medications prior to arrival.  The history is provided by the patient.  Eye Injury    Past Medical History:  Diagnosis Date  . Arthritis   . Hypertension   . Migraine   . Prostate cancer ([place])   . Stroke [facility]) 06/10/2013    Patient Active Problem List   Diagnosis Date Noted  . Adenocarcinoma of prostate ([place]) 08/31/2017  . Bunion 06/06/2017  . Hammer toes of both feet 06/06/2017  . Tinea pedis of both feet 06/06/2017  . HLD (hyperlipidemia) 09/03/2014  . 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 Occlusion and stenosis of vertebral artery with cerebral infarction ([place]) 05/30/2014  . TIA (transient ischemic attack) 06/11/2013  . Dizziness 06/10/2013  . HTN (hypertension) 06/10/2013    Past Surgical History:  Procedure Laterality Date  . BACK SURGERY  1983  . PROSTATE BIOPSY          Home Medications    Prior to Admission medications   Medication Sig Start Date End Date Taking? Authorizing Provider  acetaminophen (TYLENOL) 500 MG tablet Take 500 mg by mouth every 8 (eight) hours as needed for mild pain.    [provider]  atorvastatin (LIPITOR) 10 MG tablet Take 1 tablet (10 mg total) by mouth daily. 08/29/13   Philmore Pali, NP  atropine 1 % ophthalmic solution  Place 1 drop into the right eye 2 (two) times daily for 7 days. 04/27/19 05/04/19  Aaden Buckman, Wenda Overland, MD  clopidogrel (PLAVIX) 75 MG tablet Take 1 tablet (75 mg total) by mouth daily with breakfast. 06/11/13   Reyne Dumas, MD  ketoconazole (NIZORAL) 2 % cream Apply 1 application topically daily. Patient not taking: Reported on 08/29/2017 06/03/17   Trula Slade, DPM  lisinopril (PRINIVIL,ZESTRIL) 5 MG tablet Take 5 mg by mouth daily.  05/18/17   [provider]  Multiple Vitamin (MULTIVITAMIN WITH MINERALS) TABS tablet Take 1 tablet by mouth daily. One a Day    [provider]  prednisoLONE acetate (PRED FORTE) 1 % ophthalmic suspension Place 1 drop into the right eye 4 (four) times daily. 04/27/19   Cashlynn Yearwood, Wenda Overland, MD    Family History Family History  Problem Relation Age of Onset  . CAD Father   . Heart disease Father   . Brain cancer Sister   . Cancer Sister        brain    Social History Social History   Tobacco Use  . Smoking status: Never Smoker  . Smokeless tobacco: Never Used  Substance Use Topics  . Alcohol use: No    Alcohol/week: 0.0 standard drinks  . Drug use: No     Allergies  Patient has no known allergies.   Review of Systems Review of Systems All other systems reviewed and are negative except that which was mentioned in HPI   Physical Exam Updated Vital Signs BP 134/87   Pulse 64   Temp 98.7 F (37.1 C) (Oral)   Resp 16   Ht 6\' 1"  (1.854 m)   Wt 97.5 kg   SpO2 100%   BMI 28.37 kg/m   Physical Exam Vitals signs and nursing note reviewed.  Constitutional:      General: He is not in acute distress.    Appearance: He is well-developed.  HENT:     Head: Normocephalic.     Nose: Nose normal.     Mouth/Throat:     Mouth: Mucous membranes are moist.  Eyes:     Comments: L pupil round, reactive to light; R cornea completely opacified by blood obscuring pupil; not reactive to light; hemorrhage on lateral eye w/  chemosis  Neck:     Musculoskeletal: Neck supple.  Cardiovascular:     Rate and Rhythm: Normal rate.  Pulmonary:     Effort: Pulmonary effort is normal.  Abdominal:     General: There is no distension.  Musculoskeletal:        General: No swelling.  Skin:    General: Skin is warm and dry.  Neurological:     Mental Status: He is alert and oriented to person, place, and time.  Psychiatric:        Judgment: Judgment normal.      ED Treatments / Results  Labs (all labs ordered are listed, but only abnormal results are displayed) Labs Reviewed - No data to display  EKG None  Radiology Ct Orbits Wo Contrast  Result Date: 04/27/2019 CLINICAL DATA:  Injury right eye. Rule out foreign body or injury to globe. Patient cannot see from right eye. EXAM: CT ORBITS WITHOUT CONTRAST TECHNIQUE: Multidetector CT images were obtained using the standard protocol without intravenous contrast. COMPARISON:  MRI head 06/11/2013 FINDINGS: Orbits: Globe is normal in density and symmetric in shape. No hemorrhage or gas within the globe. Lens is normal bilaterally. No hematoma in the orbit.  Overlying soft tissues are normal. Negative for foreign body in the orbit. Visualized sinuses: Mucosal edema paranasal sinuses without air-fluid level. Soft tissues: No significant soft tissue swelling or soft tissue gas Skeletal: No fracture identified. Limited intracranial: Negative IMPRESSION: No imaging evidence of acute orbital injury or foreign body. No fracture. The globe is normal appearing bilaterally without hemorrhage These results were called by telephone at the time of interpretation on 04/27/2019 at 3:02 pm to Dr. Theotis Burrow , who verbally acknowledged these results. Electronically Signed   By: Franchot Gallo M.D.   On: 04/27/2019 15:03    Procedures Procedures (including critical care time)  Medications Ordered in ED Medications  fluorescein ophthalmic strip 1 strip (1 strip Both Eyes Not Given  04/27/19 1252)  proparacaine (ALCAINE) 0.5 % ophthalmic solution 1 drop (1 drop Both Eyes Not Given 04/27/19 1251)  ondansetron (ZOFRAN) injection 4 mg (4 mg Intravenous Given 04/27/19 1341)  Tdap (BOOSTRIX) injection 0.5 mL (0.5 mLs Intramuscular Given 04/27/19 1445)     Initial Impression / Assessment and Plan / ED Course  I have reviewed the triage vital signs and the nursing notes.  Pertinent imaging results that were available during my care of the patient were reviewed by me and considered in my medical decision making (see chart for details).  Traumatic hyphema on visual exam, concern for possible foreign body or open globe injury.  Attained orbital CT which was reassuring.  Consulted ophthalmology, Dr. Katy Fitch, evaluated the patient in the ED and saw no evidence of globe rupture.  Have provided with atropine and prednisolone drops and he will be seen in the clinic daily for the next 1 week.  Final Clinical Impressions(s) / ED Diagnoses   Final diagnoses:  Traumatic hyphema of right eye, initial encounter    ED Discharge Orders         Ordered    atropine 1 % ophthalmic solution  2 times daily     04/27/19 1507    prednisoLONE acetate (PRED FORTE) 1 % ophthalmic suspension  4 times daily     04/27/19 1507           Gerhardt Gleed, Wenda Overland, MD 04/27/19 [phone]

## 2019-04-27 NOTE — Consult Note (Signed)
Ophthalmology Initial Consult Note  Randy, Myers, 73 y.o. male Date of Service:  04/27/2019  Requesting physician: Sharlett Iles, MD  Information Obtained from: patient Chief Complaint:  Direct trauma to right eye, r/o open globe  HPI/Discussion:  Randy Myers is a 73 y.o. male who presents to [facility] ER s/p chainsaw injury. He as using a chainsaw earlier today when a piece of metal broke off of the chainsaw and flew directly at him, striking first his right knee and then his right eye. He reports immediate vision loss in the right eye, pain, and bleeding. He is currently undergoing CT.  Past Ocular Hx:  Denies Ocular Meds:  None Family ocular history: Noncontributory  Past Medical History:  Diagnosis Date  . Arthritis   . Hypertension   . Migraine   . Prostate cancer ([place])   . Stroke [facility]) 06/10/2013   Past Surgical History:  Procedure Laterality Date  . BACK SURGERY  1983  . PROSTATE BIOPSY      Prior to Admission Meds: (Not in a hospital admission)   Inpatient Meds: None  No Known Allergies Social History   Tobacco Use  . Smoking status: Never Smoker  . Smokeless tobacco: Never Used  Substance Use Topics  . Alcohol use: No    Alcohol/week: 0.0 standard drinks   Family History  Problem Relation Age of Onset  . CAD Father   . Heart disease Father   . Brain cancer Sister   . Cancer Sister        brain    ROS: Other than ROS in the HPI, all other systems were negative.  Exam: Temp: 97.8 F (36.6 C) Pulse Rate: 74(Simultaneous filing. User may not have seen previous data.) BP: 139/73(Simultaneous filing. User may not have seen previous data.) Resp: 16 SpO2: 97 %(Simultaneous filing. User may not have seen previous data.)  Visual Acuity:  Near   OD HM   OS 20/40      OD OS  Confr Vis Fields Extinguished Full  EOM (Primary) Grossly full on observation Grossly full on observation  Lids/Lashes Normal Normal [phone]  Conjunctiva -  [place] subconjunctival hemorrhage from 6-9 o'clock Normal  Conjunctiva - Palpebral               Normal Normal  Adnexa  Normal Normal  Pupils  Obscured by jostled hyphema, +rAPD by  Round, reactive  Cornea  [place] Clear  Anterior Chamber Formed, jostled hyphema Formed, grossly quiet  Lens:  No view Clear  IOP 13 11  Fundus - Dilated? YES   Optic Disc - C:D Ratio No view 0.4  Post Seg:  Retina                    Vessels No view Normal                  Vitreous  Dense hemorrhage Clear                  Macula No view Normal                  Periphery No view Normal       Neuro:  Oriented to person, place, and time:  Yes Psychiatric:  Mood and Affect Appropriate:  Yes  Labs/imaging:   A/P:  73 y.o. male with direct trauma to OD in which he sustained:  1) Hyphema OD 2) Vitreous hemorrhage OD 3) Subconjunctival hemorrhage OD  Guarded  prognosis. Start pred QID and atropine BID. Continue Plavix given risk of CVA. Minimize activity. Sleep with head of bed elevated. Keep the eye covered with glasses or shield.  Patient to see me tomorrow at 0915AM at the office. My number is [place], MD [place] [phone] [address], [address] [place], [place] [address]  [name], MD 04/27/2019, 2:35 PM

## 2019-04-27 NOTE — Discharge Instructions (Addendum)
Dr. Katy Fitch: [phone] Call if emergency  Sleep with head of bed elevated. See Dr. Katy Fitch tomorrow at 9:15am at his office.  Call him if any sudden changes.

## 2019-04-29 DIAGNOSIS — S0511XD Contusion of eyeball and orbital tissues, right eye, subsequent encounter: Secondary | ICD-10-CM | POA: Diagnosis not present

## 2019-04-29 DIAGNOSIS — H5213 Myopia, bilateral: Secondary | ICD-10-CM | POA: Diagnosis not present

## 2019-04-30 DIAGNOSIS — S0511XD Contusion of eyeball and orbital tissues, right eye, subsequent encounter: Secondary | ICD-10-CM | POA: Diagnosis not present

## 2019-05-02 DIAGNOSIS — H3561 Retinal hemorrhage, right eye: Secondary | ICD-10-CM | POA: Diagnosis not present

## 2019-05-02 DIAGNOSIS — S0511XA Contusion of eyeball and orbital tissues, right eye, initial encounter: Secondary | ICD-10-CM | POA: Diagnosis not present

## 2019-05-02 DIAGNOSIS — S058X1A Other injuries of right eye and orbit, initial encounter: Secondary | ICD-10-CM | POA: Diagnosis not present

## 2019-05-02 DIAGNOSIS — H3581 Retinal edema: Secondary | ICD-10-CM | POA: Diagnosis not present

## 2019-05-02 DIAGNOSIS — H25813 Combined forms of age-related cataract, bilateral: Secondary | ICD-10-CM | POA: Diagnosis not present

## 2019-05-02 DIAGNOSIS — H5711 Ocular pain, right eye: Secondary | ICD-10-CM | POA: Diagnosis not present

## 2019-05-02 DIAGNOSIS — H2101 Hyphema, right eye: Secondary | ICD-10-CM | POA: Diagnosis not present

## 2019-05-02 DIAGNOSIS — H35362 Drusen (degenerative) of macula, left eye: Secondary | ICD-10-CM | POA: Diagnosis not present

## 2019-05-02 DIAGNOSIS — H4311 Vitreous hemorrhage, right eye: Secondary | ICD-10-CM | POA: Diagnosis not present

## 2019-05-03 DIAGNOSIS — S0511XD Contusion of eyeball and orbital tissues, right eye, subsequent encounter: Secondary | ICD-10-CM | POA: Diagnosis not present

## 2019-05-10 DIAGNOSIS — S0511XD Contusion of eyeball and orbital tissues, right eye, subsequent encounter: Secondary | ICD-10-CM | POA: Diagnosis not present

## 2019-05-16 DIAGNOSIS — S0511XD Contusion of eyeball and orbital tissues, right eye, subsequent encounter: Secondary | ICD-10-CM | POA: Diagnosis not present

## 2019-05-29 DIAGNOSIS — Z0000 Encounter for general adult medical examination without abnormal findings: Secondary | ICD-10-CM | POA: Diagnosis not present

## 2019-05-29 DIAGNOSIS — C61 Malignant neoplasm of prostate: Secondary | ICD-10-CM | POA: Diagnosis not present

## 2019-05-29 DIAGNOSIS — Z91018 Allergy to other foods: Secondary | ICD-10-CM | POA: Diagnosis not present

## 2019-05-29 DIAGNOSIS — R739 Hyperglycemia, unspecified: Secondary | ICD-10-CM | POA: Diagnosis not present

## 2019-05-29 DIAGNOSIS — Z8673 Personal history of transient ischemic attack (TIA), and cerebral infarction without residual deficits: Secondary | ICD-10-CM | POA: Diagnosis not present

## 2019-05-29 DIAGNOSIS — K219 Gastro-esophageal reflux disease without esophagitis: Secondary | ICD-10-CM | POA: Diagnosis not present

## 2019-05-29 DIAGNOSIS — E7800 Pure hypercholesterolemia, unspecified: Secondary | ICD-10-CM | POA: Diagnosis not present

## 2019-05-29 DIAGNOSIS — I10 Essential (primary) hypertension: Secondary | ICD-10-CM | POA: Diagnosis not present

## 2019-05-29 DIAGNOSIS — M2042 Other hammer toe(s) (acquired), left foot: Secondary | ICD-10-CM | POA: Diagnosis not present

## 2019-06-18 DIAGNOSIS — S0511XD Contusion of eyeball and orbital tissues, right eye, subsequent encounter: Secondary | ICD-10-CM | POA: Diagnosis not present

## 2019-06-18 DIAGNOSIS — H2513 Age-related nuclear cataract, bilateral: Secondary | ICD-10-CM | POA: Diagnosis not present

## 2019-07-11 DIAGNOSIS — H35362 Drusen (degenerative) of macula, left eye: Secondary | ICD-10-CM | POA: Diagnosis not present

## 2019-07-11 DIAGNOSIS — S0511XA Contusion of eyeball and orbital tissues, right eye, initial encounter: Secondary | ICD-10-CM | POA: Diagnosis not present

## 2019-07-11 DIAGNOSIS — H4311 Vitreous hemorrhage, right eye: Secondary | ICD-10-CM | POA: Diagnosis not present

## 2019-07-11 DIAGNOSIS — H21341 Primary cyst of pars plana, right eye: Secondary | ICD-10-CM | POA: Diagnosis not present

## 2019-07-11 DIAGNOSIS — H1789 Other corneal scars and opacities: Secondary | ICD-10-CM | POA: Diagnosis not present

## 2019-07-11 DIAGNOSIS — H2101 Hyphema, right eye: Secondary | ICD-10-CM | POA: Diagnosis not present

## 2019-07-11 DIAGNOSIS — H25813 Combined forms of age-related cataract, bilateral: Secondary | ICD-10-CM | POA: Diagnosis not present

## 2019-07-11 DIAGNOSIS — S058X1A Other injuries of right eye and orbit, initial encounter: Secondary | ICD-10-CM | POA: Diagnosis not present

## 2019-07-11 DIAGNOSIS — H3581 Retinal edema: Secondary | ICD-10-CM | POA: Diagnosis not present

## 2019-09-07 DIAGNOSIS — M2041 Other hammer toe(s) (acquired), right foot: Secondary | ICD-10-CM | POA: Diagnosis not present

## 2019-09-07 DIAGNOSIS — M2021 Hallux rigidus, right foot: Secondary | ICD-10-CM | POA: Diagnosis not present

## 2019-09-07 DIAGNOSIS — M21611 Bunion of right foot: Secondary | ICD-10-CM | POA: Diagnosis not present

## 2019-09-07 DIAGNOSIS — M2042 Other hammer toe(s) (acquired), left foot: Secondary | ICD-10-CM | POA: Diagnosis not present

## 2019-09-07 DIAGNOSIS — M21612 Bunion of left foot: Secondary | ICD-10-CM | POA: Diagnosis not present

## 2019-09-07 DIAGNOSIS — M79672 Pain in left foot: Secondary | ICD-10-CM | POA: Diagnosis not present

## 2019-09-07 DIAGNOSIS — M79671 Pain in right foot: Secondary | ICD-10-CM | POA: Diagnosis not present

## 2019-09-07 DIAGNOSIS — M2022 Hallux rigidus, left foot: Secondary | ICD-10-CM | POA: Diagnosis not present

## 2019-09-11 DIAGNOSIS — Z23 Encounter for immunization: Secondary | ICD-10-CM | POA: Diagnosis not present

## 2019-09-13 ENCOUNTER — Telehealth: Payer: Self-pay

## 2019-09-13 NOTE — Telephone Encounter (Signed)
Left vm for Glendale Chard at [phone] at [facility] that pt was last seen 09/03/2014. I stated he was release back to his primary doctor and we dont manage his plavix. I advise that pts primary doctor can do clearance or a new referral will have to be sent to our office.I left number of [phone] to call back for any questions.

## 2019-09-14 ENCOUNTER — Inpatient Hospital Stay (HOSPITAL_COMMUNITY)
Admission: EM | Admit: 2019-09-14 | Discharge: 2019-09-19 | DRG: 199 | Disposition: A | Discharge status: Home / Self Care | Payer: Medicare HMO | Attending: Surgery | Admitting: Surgery

## 2019-09-14 ENCOUNTER — Emergency Department (HOSPITAL_COMMUNITY): Payer: Medicare HMO

## 2019-09-14 ENCOUNTER — Encounter (HOSPITAL_COMMUNITY): Payer: Self-pay | Admitting: Emergency Medicine

## 2019-09-14 ENCOUNTER — Other Ambulatory Visit: Payer: Self-pay

## 2019-09-14 DIAGNOSIS — M25511 Pain in right shoulder: Secondary | ICD-10-CM | POA: Diagnosis not present

## 2019-09-14 DIAGNOSIS — M25521 Pain in right elbow: Secondary | ICD-10-CM | POA: Diagnosis not present

## 2019-09-14 DIAGNOSIS — R52 Pain, unspecified: Secondary | ICD-10-CM | POA: Diagnosis not present

## 2019-09-14 DIAGNOSIS — S0990XA Unspecified injury of head, initial encounter: Secondary | ICD-10-CM | POA: Diagnosis not present

## 2019-09-14 DIAGNOSIS — R102 Pelvic and perineal pain: Secondary | ICD-10-CM | POA: Diagnosis not present

## 2019-09-14 DIAGNOSIS — N289 Disorder of kidney and ureter, unspecified: Secondary | ICD-10-CM

## 2019-09-14 DIAGNOSIS — J9690 Respiratory failure, unspecified, unspecified whether with hypoxia or hypercapnia: Secondary | ICD-10-CM

## 2019-09-14 DIAGNOSIS — Z923 Personal history of irradiation: Secondary | ICD-10-CM | POA: Diagnosis not present

## 2019-09-14 DIAGNOSIS — Z4682 Encounter for fitting and adjustment of non-vascular catheter: Secondary | ICD-10-CM | POA: Diagnosis not present

## 2019-09-14 DIAGNOSIS — S32039A Unspecified fracture of third lumbar vertebra, initial encounter for closed fracture: Secondary | ICD-10-CM | POA: Diagnosis not present

## 2019-09-14 DIAGNOSIS — Z79899 Other long term (current) drug therapy: Secondary | ICD-10-CM | POA: Diagnosis not present

## 2019-09-14 DIAGNOSIS — S2242XA Multiple fractures of ribs, left side, initial encounter for closed fracture: Secondary | ICD-10-CM | POA: Diagnosis not present

## 2019-09-14 DIAGNOSIS — Z20828 Contact with and (suspected) exposure to other viral communicable diseases: Secondary | ICD-10-CM | POA: Diagnosis present

## 2019-09-14 DIAGNOSIS — S32049A Unspecified fracture of fourth lumbar vertebra, initial encounter for closed fracture: Secondary | ICD-10-CM | POA: Diagnosis not present

## 2019-09-14 DIAGNOSIS — S270XXA Traumatic pneumothorax, initial encounter: Secondary | ICD-10-CM | POA: Diagnosis not present

## 2019-09-14 DIAGNOSIS — W11XXXA Fall on and from ladder, initial encounter: Secondary | ICD-10-CM | POA: Diagnosis present

## 2019-09-14 DIAGNOSIS — S299XXA Unspecified injury of thorax, initial encounter: Secondary | ICD-10-CM | POA: Diagnosis not present

## 2019-09-14 DIAGNOSIS — J9811 Atelectasis: Secondary | ICD-10-CM | POA: Diagnosis not present

## 2019-09-14 DIAGNOSIS — S27321A Contusion of lung, unilateral, initial encounter: Secondary | ICD-10-CM | POA: Diagnosis present

## 2019-09-14 DIAGNOSIS — J939 Pneumothorax, unspecified: Secondary | ICD-10-CM

## 2019-09-14 DIAGNOSIS — E1165 Type 2 diabetes mellitus with hyperglycemia: Secondary | ICD-10-CM | POA: Diagnosis not present

## 2019-09-14 DIAGNOSIS — Z7902 Long term (current) use of antithrombotics/antiplatelets: Secondary | ICD-10-CM | POA: Diagnosis not present

## 2019-09-14 DIAGNOSIS — M542 Cervicalgia: Secondary | ICD-10-CM | POA: Diagnosis not present

## 2019-09-14 DIAGNOSIS — Z8546 Personal history of malignant neoplasm of prostate: Secondary | ICD-10-CM

## 2019-09-14 DIAGNOSIS — S301XXA Contusion of abdominal wall, initial encounter: Secondary | ICD-10-CM

## 2019-09-14 DIAGNOSIS — R42 Dizziness and giddiness: Secondary | ICD-10-CM | POA: Diagnosis not present

## 2019-09-14 DIAGNOSIS — I2510 Atherosclerotic heart disease of native coronary artery without angina pectoris: Secondary | ICD-10-CM | POA: Diagnosis present

## 2019-09-14 DIAGNOSIS — R578 Other shock: Secondary | ICD-10-CM | POA: Diagnosis not present

## 2019-09-14 DIAGNOSIS — S199XXA Unspecified injury of neck, initial encounter: Secondary | ICD-10-CM | POA: Diagnosis not present

## 2019-09-14 DIAGNOSIS — M25512 Pain in left shoulder: Secondary | ICD-10-CM | POA: Diagnosis not present

## 2019-09-14 DIAGNOSIS — M25522 Pain in left elbow: Secondary | ICD-10-CM | POA: Diagnosis not present

## 2019-09-14 DIAGNOSIS — M25519 Pain in unspecified shoulder: Secondary | ICD-10-CM | POA: Diagnosis not present

## 2019-09-14 DIAGNOSIS — S4992XA Unspecified injury of left shoulder and upper arm, initial encounter: Secondary | ICD-10-CM | POA: Diagnosis not present

## 2019-09-14 DIAGNOSIS — W19XXXA Unspecified fall, initial encounter: Secondary | ICD-10-CM

## 2019-09-14 DIAGNOSIS — S3993XA Unspecified injury of pelvis, initial encounter: Secondary | ICD-10-CM | POA: Diagnosis not present

## 2019-09-14 DIAGNOSIS — R519 Headache, unspecified: Secondary | ICD-10-CM | POA: Diagnosis not present

## 2019-09-14 DIAGNOSIS — R202 Paresthesia of skin: Secondary | ICD-10-CM | POA: Diagnosis not present

## 2019-09-14 DIAGNOSIS — S59902A Unspecified injury of left elbow, initial encounter: Secondary | ICD-10-CM | POA: Diagnosis not present

## 2019-09-14 HISTORY — DX: Atherosclerotic heart disease of native coronary artery without angina pectoris: I25.10

## 2019-09-14 LAB — CBC
HCT: 34.5 % — ABNORMAL LOW (ref 39.0–52.0)
HCT: 34.3 % — ABNORMAL LOW (ref 39.0–52.0)
Hemoglobin: 11.2 g/dL — ABNORMAL LOW (ref 13.0–17.0)
Hemoglobin: 11.2 g/dL — ABNORMAL LOW (ref 13.0–17.0)
MCH: 30.4 pg (ref 26.0–34.0)
MCH: 29.4 pg (ref 26.0–34.0)
MCHC: 32.5 g/dL (ref 30.0–36.0)
MCHC: 32.7 g/dL (ref 30.0–36.0)
MCV: 93.8 fL (ref 80.0–100.0)
MCV: 90 fL (ref 80.0–100.0)
Platelets: 196 10*3/uL (ref 150–400)
Platelets: 125 10*3/uL — ABNORMAL LOW (ref 150–400)
RBC: 3.68 MIL/uL — ABNORMAL LOW (ref 4.22–5.81)
RBC: 3.81 MIL/uL — ABNORMAL LOW (ref 4.22–5.81)
RDW: 13.5 % (ref 11.5–15.5)
RDW: 14.6 % (ref 11.5–15.5)
WBC: 15.2 10*3/uL — ABNORMAL HIGH (ref 4.0–10.5)
WBC: 12.1 10*3/uL — ABNORMAL HIGH (ref 4.0–10.5)
nRBC: 0 % (ref 0.0–0.2)
nRBC: 0 % (ref 0.0–0.2)

## 2019-09-14 LAB — COMPREHENSIVE METABOLIC PANEL
ALT: 16 U/L (ref 0–44)
AST: 39 U/L (ref 15–41)
Albumin: 3.3 g/dL — ABNORMAL LOW (ref 3.5–5.0)
Alkaline Phosphatase: 47 U/L (ref 38–126)
Anion gap: 12 (ref 5–15)
BUN: 24 mg/dL — ABNORMAL HIGH (ref 8–23)
CO2: 20 mmol/L — ABNORMAL LOW (ref 22–32)
Calcium: 8.1 mg/dL — ABNORMAL LOW (ref 8.9–10.3)
Chloride: 101 mmol/L (ref 98–111)
Creatinine, Ser: 1.28 mg/dL — ABNORMAL HIGH (ref 0.61–1.24)
GFR calc Af Amer: >60 mL/min (ref >60)
GFR calc non Af Amer: 55 mL/min — ABNORMAL LOW (ref >60)
Glucose, Bld: 279 mg/dL — ABNORMAL HIGH (ref 70–99)
Potassium: 3.7 mmol/L (ref 3.5–5.1)
Sodium: 133 mmol/L — ABNORMAL LOW (ref 135–145)
Total Bilirubin: 0.7 mg/dL (ref 0.3–1.2)
Total Protein: 5.6 g/dL — ABNORMAL LOW (ref 6.5–8.1)

## 2019-09-14 LAB — I-STAT CHEM 8, ED
BUN: 24 mg/dL — ABNORMAL HIGH (ref 8–23)
Calcium, Ion: 1.1 mmol/L — ABNORMAL LOW (ref 1.15–1.40)
Chloride: 102 mmol/L (ref 98–111)
Creatinine, Ser: 1.2 mg/dL (ref 0.61–1.24)
Glucose, Bld: 260 mg/dL — ABNORMAL HIGH (ref 70–99)
HCT: 34 % — ABNORMAL LOW (ref 39.0–52.0)
Hemoglobin: 11.6 g/dL — ABNORMAL LOW (ref 13.0–17.0)
Potassium: 3.8 mmol/L (ref 3.5–5.1)
Sodium: 138 mmol/L (ref 135–145)
TCO2: 20 mmol/L — ABNORMAL LOW (ref 22–32)

## 2019-09-14 LAB — PROTIME-INR
INR: 1.2 (ref 0.8–1.2)
Prothrombin Time: 15.1 seconds (ref 11.4–15.2)

## 2019-09-14 LAB — LACTIC ACID, PLASMA: Lactic Acid, Venous: 3.3 mmol/L (ref 0.5–1.9)

## 2019-09-14 LAB — ETHANOL: Alcohol, Ethyl (B): <10 mg/dL (ref <10)

## 2019-09-14 LAB — GLUCOSE, CAPILLARY: Glucose-Capillary: 171 mg/dL — ABNORMAL HIGH (ref 70–99)

## 2019-09-14 LAB — ABO/RH: ABO/RH(D): A POS

## 2019-09-14 MED ORDER — ONDANSETRON 4 MG PO TBDP: 4.0000 mg | ORAL_TABLET | Freq: Four times a day (QID) | ORAL | Status: DC | PRN

## 2019-09-14 MED ORDER — BISACODYL 10 MG RE SUPP: 10.0000 mg | Freq: Every day | RECTAL | Status: DC | PRN

## 2019-09-14 MED ORDER — INSULIN ASPART 100 UNIT/ML ~~LOC~~ SOLN
0.0000 [IU] | Freq: Three times a day (TID) | SUBCUTANEOUS | Status: DC
  Administered 2019-09-16 – 2019-09-17 (×2): 1 [IU] via SUBCUTANEOUS
  Administered 2019-09-18 – 2019-09-19 (×2): 2 [IU] via SUBCUTANEOUS
  Administered 2019-09-19: 09:00:00 1 [IU] via SUBCUTANEOUS

## 2019-09-14 MED ORDER — METHOCARBAMOL 500 MG PO TABS
500.0000 mg | ORAL_TABLET | Freq: Three times a day (TID) | ORAL | Status: DC
  Administered 2019-09-15 – 2019-09-17 (×8): 500 mg via ORAL
  Filled 2019-09-14 (×8): qty 1

## 2019-09-14 MED ORDER — IOHEXOL 300 MG/ML  SOLN
100.0000 mL | Freq: Once | INTRAMUSCULAR | Status: AC | PRN
  Administered 2019-09-14: 100 mL via INTRAVENOUS

## 2019-09-14 MED ORDER — OXYCODONE HCL 5 MG PO TABS
5.0000 mg | ORAL_TABLET | ORAL | Status: DC | PRN
  Administered 2019-09-15 – 2019-09-17 (×3): 5 mg via ORAL
  Filled 2019-09-14 (×3): qty 1

## 2019-09-14 MED ORDER — ACETAMINOPHEN 500 MG PO TABS
1000.0000 mg | ORAL_TABLET | Freq: Four times a day (QID) | ORAL | Status: DC
  Administered 2019-09-15 – 2019-09-19 (×18): 1000 mg via ORAL
  Filled 2019-09-14 (×18): qty 2

## 2019-09-14 MED ORDER — HYDROMORPHONE HCL 1 MG/ML IJ SOLN
1.0000 mg | INTRAMUSCULAR | Status: DC | PRN
  Administered 2019-09-15 (×3): 1 mg via INTRAVENOUS
  Filled 2019-09-14 (×3): qty 1

## 2019-09-14 MED ORDER — SODIUM CHLORIDE 0.9 % IV SOLN: 10.0000 mL/h | Freq: Once | INTRAVENOUS | Status: DC

## 2019-09-14 MED ORDER — OXYCODONE HCL 5 MG PO TABS
10.0000 mg | ORAL_TABLET | ORAL | Status: DC | PRN
  Administered 2019-09-15 – 2019-09-18 (×8): 10 mg via ORAL
  Filled 2019-09-14 (×9): qty 2

## 2019-09-14 MED ORDER — DOCUSATE SODIUM 100 MG PO CAPS
100.0000 mg | ORAL_CAPSULE | Freq: Two times a day (BID) | ORAL | Status: DC
  Administered 2019-09-15 – 2019-09-19 (×10): 100 mg via ORAL
  Filled 2019-09-14 (×10): qty 1

## 2019-09-14 MED ORDER — SODIUM CHLORIDE 0.9 % IV SOLN
INTRAVENOUS | Status: DC
  Administered 2019-09-14: 23:00:00 via INTRAVENOUS

## 2019-09-14 MED ORDER — METOPROLOL TARTRATE 5 MG/5ML IV SOLN: 5.0000 mg | Freq: Four times a day (QID) | INTRAVENOUS | Status: DC | PRN

## 2019-09-14 MED ORDER — ONDANSETRON HCL 4 MG/2ML IJ SOLN: 4.0000 mg | Freq: Four times a day (QID) | INTRAMUSCULAR | Status: DC | PRN

## 2019-09-14 MED ORDER — TRAMADOL HCL 50 MG PO TABS
50.0000 mg | ORAL_TABLET | Freq: Four times a day (QID) | ORAL | Status: DC | PRN
  Administered 2019-09-15 – 2019-09-17 (×5): 50 mg via ORAL
  Filled 2019-09-14 (×5): qty 1

## 2019-09-14 NOTE — H&P (Signed)
Randy Myers is an 73 y.o. male.   Chief Complaint:fall HPI: 43 yom s/p fall about four hours ago from ladder while cutting tree branches.  He progressively had more pain and called 911.  He has left sided chest pain and flank pain. Hurts with deep breaths nothing making it better.   He does take plavix for cad.  He remembers whole event.  He arrives with bp in the 70s, awake and alert  Past Medical History:  Diagnosis Date  . Coronary artery disease   . Prostate cancer [facility])    with radiation   PSH foot surgery  meds lisinopril, plavix, atorvastatin are what he and his wife state  nkda Nonsmoker, no etoh   Results for orders placed or performed during the hospital encounter of 09/14/19 (from the past 48 hour(s))  Comprehensive metabolic panel     Status: Abnormal   Collection Time: 09/14/19  8:53 PM  Result Value Ref Range   Sodium 133 (L) 135 - 145 mmol/L   Potassium 3.7 3.5 - 5.1 mmol/L   Chloride 101 98 - 111 mmol/L   CO2 20 (L) 22 - 32 mmol/L   Glucose, Bld 279 (H) 70 - 99 mg/dL   BUN 24 (H) 8 - 23 mg/dL   Creatinine, Ser 1.28 (H) 0.61 - 1.24 mg/dL   Calcium 8.1 (L) 8.9 - 10.3 mg/dL   Total Protein 5.6 (L) 6.5 - 8.1 g/dL   Albumin 3.3 (L) 3.5 - 5.0 g/dL   AST 39 15 - 41 U/L   ALT 16 0 - 44 U/L   Alkaline Phosphatase 47 38 - 126 U/L   Total Bilirubin 0.7 0.3 - 1.2 mg/dL   GFR calc non Af Amer 55 (L) >60 mL/min   GFR calc Af Amer >60 >60 mL/min   Anion gap 12 5 - 15    Comment: Performed at [facility], [address], [place], [place] [address]  CBC     Status: Abnormal   Collection Time: 09/14/19  8:53 PM  Result Value Ref Range   WBC 15.2 (H) 4.0 - 10.5 K/uL   RBC 3.68 (L) 4.22 - 5.81 MIL/uL   Hemoglobin 11.2 (L) 13.0 - 17.0 g/dL   HCT 34.5 (L) 39.0 - 52.0 %   MCV 93.8 80.0 - 100.0 fL   MCH 30.4 26.0 - 34.0 pg   MCHC 32.5 30.0 - 36.0 g/dL   RDW 13.5 11.5 - 15.5 %   Platelets 196 150 - 400 K/uL   nRBC 0.0 0.0 - 0.2 %    Comment: Performed at [facility], [place] [address], [place], [place] [address]  Ethanol     Status: None   Collection Time: 09/14/19  8:53 PM  Result Value Ref Range   Alcohol, Ethyl (B) <10 <10 mg/dL    Comment: (NOTE) Lowest detectable limit for serum alcohol is 10 mg/dL. For medical purposes only. Performed at [facility], [place] [address] [place], [place] [address]   Protime-INR     Status: None   Collection Time: 09/14/19  8:53 PM  Result Value Ref Range   Prothrombin Time 15.1 11.4 - 15.2 seconds   INR 1.2 0.8 - 1.2    Comment: (NOTE) INR goal varies based on device and disease states. Performed at [facility], [place] [address] [place], [place] [address]   Type and screen Ordered by PROVIDER DEFAULT     Status: None (Preliminary result)  Collection Time: 09/14/19  9:00 PM  Result Value Ref Range   ABO/RH(D) A POS    Antibody Screen NEG    Sample Expiration 09/17/2019,2359    Unit Number [id number]    Blood Component Type RED CELLS,LR    Unit division 00    Status of Unit ISSUED    Transfusion Status OK TO TRANSFUSE    Crossmatch Result COMPATIBLE    Unit tag comment VERBAL ORDERS PER DR DR LONG    Unit Number [id number]    Blood Component Type RED CELLS,LR    Unit division 00    Status of Unit ISSUED    Transfusion Status OK TO TRANSFUSE    Crossmatch Result COMPATIBLE    Unit tag comment VERBAL ORDERS PER DR DR LONG    Unit Number [id number]    Blood Component Type RBC LR PHER2    Unit division 00    Status of Unit ALLOCATED    Transfusion Status OK TO TRANSFUSE    Crossmatch Result      Compatible Performed at [facility], [place] [address] [place], [place] [address]    Unit Number [id number]    Blood Component Type RED CELLS,LR    Unit division 00    Status of Unit ALLOCATED    Transfusion Status OK TO TRANSFUSE    Crossmatch Result Compatible   Prepare fresh frozen plasma     Status: None (Preliminary result)   Collection Time: 09/14/19  9:00  PM  Result Value Ref Range   Unit Number [id number]    Blood Component Type THAWED PLASMA    Unit division 00    Status of Unit ISSUED    Transfusion Status OK TO TRANSFUSE    Unit Number [id number]    Blood Component Type THAWED PLASMA    Unit division 00    Status of Unit ISSUED    Transfusion Status OK TO TRANSFUSE   ABO/Rh     Status: None   Collection Time: 09/14/19  9:00 PM  Result Value Ref Range   ABO/RH(D)      A POS Performed at [facility], [place] [address], [place], [place] [address]   I-stat chem 8, ED     Status: Abnormal   Collection Time: 09/14/19  9:16 PM  Result Value Ref Range   Sodium 138 135 - 145 mmol/L   Potassium 3.8 3.5 - 5.1 mmol/L   Chloride 102 98 - 111 mmol/L   BUN 24 (H) 8 - 23 mg/dL   Creatinine, Ser 1.20 0.61 - 1.24 mg/dL   Glucose, Bld 260 (H) 70 - 99 mg/dL   Calcium, Ion 1.10 (L) 1.15 - 1.40 mmol/L   TCO2 20 (L) 22 - 32 mmol/L   Hemoglobin 11.6 (L) 13.0 - 17.0 g/dL   HCT 34.0 (L) 39.0 - 52.0 %  Prepare Pheresed Platelets     Status: None (Preliminary result)   Collection Time: 09/14/19  9:43 PM  Result Value Ref Range   Unit Number [id number]    Blood Component Type PLTPH LI3 PAS    Unit division 00    Status of Unit ALLOCATED    Transfusion Status OK TO TRANSFUSE    Ct Chest W Contrast  Result Date: 09/14/2019 CLINICAL DATA:  Fall EXAM: CT CHEST, ABDOMEN, AND PELVIS WITH CONTRAST TECHNIQUE: Multidetector CT imaging of the chest, abdomen and pelvis was performed following the standard protocol during bolus administration of intravenous contrast. CONTRAST:  133mL OMNIPAQUE  IOHEXOL 300 MG/ML  SOLN COMPARISON:  None. FINDINGS: CT CHEST FINDINGS Cardiovascular: Heart is normal size. Coronary artery and aortic calcifications. Aorta is slightly aneurysmal at 4.1 cm within the ascending thoracic aorta. No dissection or evidence of aortic injury. Mediastinum/Nodes: No mediastinal, hilar, or axillary adenopathy. Lungs/Pleura:  Moderate-sized left pneumothorax. Small left pleural effusion. Airspace opacity noted in the left lower lobe, likely a combination of atelectasis and contusion. Dependent atelectasis in the right lung. Musculoskeletal: Fracture through the lateral left 4th through 9th ribs and the anterior 3rd through 6th ribs on the left. Fracture through the posterior left 1st, 9th through 11th ribs and the left T9 and T10 transverse processes. CT ABDOMEN PELVIS FINDINGS Hepatobiliary: No hepatic injury or perihepatic hematoma. Gallbladder is unremarkable Pancreas: No focal abnormality or ductal dilatation. Spleen: No splenic injury or perisplenic hematoma. Adrenals/Urinary Tract: No adrenal hemorrhage or renal injury identified. Bladder is unremarkable. Nonobstructing stones in the kidneys bilaterally. No hydronephrosis. Stomach/Bowel: Large stool burden throughout the colon. Stomach and small bowel decompressed. Appendix is normal. Vascular/Lymphatic: Aortic atherosclerosis. No enlarged abdominal or pelvic lymph nodes. Reproductive: Prostate calcifications. Other: No free fluid or free air. Musculoskeletal: There are fractures through the left L3 and L4 transverse processes. Large hematoma within the left flank and lateral abdominal wall soft tissues as well as in the left oblique muscles. IMPRESSION: Multiple left rib fractures including the lateral 4th through 9th ribs, the anterior 3rd through 6th ribs, and the posterior left 1st, 9th through 11th ribs. Fractures through the left T9 and T10 transverse processes. Associated moderate left pneumothorax and small left pleural effusion. Likely a combination of atelectasis and contusion in the left lower lobe. Fractures through the left L3 and L4 transverse processes. Large hematoma within the left flank subcutaneous soft tissues extending into the lateral left pelvis. There is also likely intramuscular hematoma and involving the left oblique muscles. Large stool burden in the  colon. Nephrolithiasis. Aortic atherosclerosis. These results were were discussed in person on 09/14/2019 at 9:58 pm with provider Sarina Robleto , who verbally acknowledged these results. Electronically Signed   By: Rolm Baptise M.D.   On: 09/14/2019 21:58   Ct Abdomen Pelvis W Contrast  Result Date: 09/14/2019 CLINICAL DATA:  Fall EXAM: CT CHEST, ABDOMEN, AND PELVIS WITH CONTRAST TECHNIQUE: Multidetector CT imaging of the chest, abdomen and pelvis was performed following the standard protocol during bolus administration of intravenous contrast. CONTRAST:  182mL OMNIPAQUE IOHEXOL 300 MG/ML  SOLN COMPARISON:  None. FINDINGS: CT CHEST FINDINGS Cardiovascular: Heart is normal size. Coronary artery and aortic calcifications. Aorta is slightly aneurysmal at 4.1 cm within the ascending thoracic aorta. No dissection or evidence of aortic injury. Mediastinum/Nodes: No mediastinal, hilar, or axillary adenopathy. Lungs/Pleura: Moderate-sized left pneumothorax. Small left pleural effusion. Airspace opacity noted in the left lower lobe, likely a combination of atelectasis and contusion. Dependent atelectasis in the right lung. Musculoskeletal: Fracture through the lateral left 4th through 9th ribs and the anterior 3rd through 6th ribs on the left. Fracture through the posterior left 1st, 9th through 11th ribs and the left T9 and T10 transverse processes. CT ABDOMEN PELVIS FINDINGS Hepatobiliary: No hepatic injury or perihepatic hematoma. Gallbladder is unremarkable Pancreas: No focal abnormality or ductal dilatation. Spleen: No splenic injury or perisplenic hematoma. Adrenals/Urinary Tract: No adrenal hemorrhage or renal injury identified. Bladder is unremarkable. Nonobstructing stones in the kidneys bilaterally. No hydronephrosis. Stomach/Bowel: Large stool burden throughout the colon. Stomach and small bowel decompressed. Appendix is normal. Vascular/Lymphatic: Aortic atherosclerosis.  No enlarged abdominal or pelvic  lymph nodes. Reproductive: Prostate calcifications. Other: No free fluid or free air. Musculoskeletal: There are fractures through the left L3 and L4 transverse processes. Large hematoma within the left flank and lateral abdominal wall soft tissues as well as in the left oblique muscles. IMPRESSION: Multiple left rib fractures including the lateral 4th through 9th ribs, the anterior 3rd through 6th ribs, and the posterior left 1st, 9th through 11th ribs. Fractures through the left T9 and T10 transverse processes. Associated moderate left pneumothorax and small left pleural effusion. Likely a combination of atelectasis and contusion in the left lower lobe. Fractures through the left L3 and L4 transverse processes. Large hematoma within the left flank subcutaneous soft tissues extending into the lateral left pelvis. There is also likely intramuscular hematoma and involving the left oblique muscles. Large stool burden in the colon. Nephrolithiasis. Aortic atherosclerosis. These results were were discussed in person on 09/14/2019 at 9:58 pm with provider Lorena Clearman , who verbally acknowledged these results. Electronically Signed   By: Rolm Baptise M.D.   On: 09/14/2019 21:58   Dg Pelvis Portable  Result Date: 09/14/2019 CLINICAL DATA:  Acute pain due to trauma EXAM: PORTABLE PELVIS 1-2 VIEWS COMPARISON:  None. FINDINGS: There is no evidence of pelvic fracture or diastasis. No pelvic bone lesions are seen. IMPRESSION: Negative. Electronically Signed   By: Constance Holster M.D.   On: 09/14/2019 21:13   Dg Chest Port 1 View  Result Date: 09/14/2019 CLINICAL DATA:  Acute pain due to trauma EXAM: PORTABLE CHEST 1 VIEW COMPARISON:  None. FINDINGS: There are multiple displaced left-sided rib fractures involving the third through ninth ribs posteriorly on the left. No definite left-sided pneumothorax. There is atelectasis at the left lung base. There may be a small left-sided hemothorax. The heart size is  normal. IMPRESSION: 1. Acute displaced fractures involving the third through ninth ribs on the left. 2. No evidence for left-sided pneumothorax. 3. Atelectasis at the left lung base. Possible small left hemothorax. Electronically Signed   By: Constance Holster M.D.   On: 09/14/2019 21:15    Review of Systems  Respiratory: Negative for cough and shortness of breath.   Cardiovascular: Positive for chest pain.  Gastrointestinal: Negative for abdominal pain.  Genitourinary: Positive for flank pain.  Neurological: Negative for focal weakness, loss of consciousness and weakness.  All other systems reviewed and are negative.   Blood pressure 122/76, pulse 89, temperature (!) 96.6 F (35.9 C), temperature source Temporal, resp. rate (!) 25, height 6' (1.829 m), weight 86.6 kg, SpO2 98 %. Physical Exam  Vitals reviewed. Constitutional: He is oriented to person, place, and time. He appears well-developed and well-nourished. No distress.  HENT:  Head: Normocephalic and atraumatic.  Right Ear: External ear normal.  Left Ear: External ear normal.  Mouth/Throat: Oropharynx is clear and moist.  Eyes: Pupils are equal, round, and reactive to light. No scleral icterus.  Neck: No spinous process tenderness and no muscular tenderness present.  Cardiovascular: Normal rate, regular rhythm, normal heart sounds and intact distal pulses.  Respiratory: Effort normal and breath sounds normal.  GI: Soft. Bowel sounds are normal. He exhibits no distension. No hernia.  Large left flank hematoma  Musculoskeletal: Normal range of motion.        General: Tenderness (left shoulder and elbow) present.  Neurological: He is alert and oriented to person, place, and time. He has normal strength. No sensory deficit. GCS eye subscore is 4. GCS verbal subscore  is 5. GCS motor subscore is 6.  Skin: Skin is dry. He is not diaphoretic.  Psychiatric: He has a normal mood and affect. His behavior is normal.      Assessment/Plan Fall Left 3-9 rib fx, pulm contusion,anterior ptx- pulm toilet, monitor sats, pain control, discussed chest tube will hold for now after discussing with him, will repeat chest xray in am.  Discussed possibility of needing tube placed. Large left flank hematoma- received 2 units prbcs and 2 units ffp with platelets in er due to low bp and plavix.  Responded well.  Will place binder and follow hct for this.  No operative intervention indicated L3 and L4 tp fx- pain control No lovenox now due to bleeding, scds Admit to icu for serial hct monitoring Remove c collar  Rolm Bookbinder, MD 09/14/2019, 10:02 PM

## 2019-09-14 NOTE — ED Provider Notes (Addendum)
[facility] EMERGENCY DEPARTMENT Provider Note   CSN: [id number] Arrival date & time: 09/14/19  2047     History   Chief Complaint Chief Complaint  Patient presents with   Trauma    HPI Randy Myers is a 73 y.o. male.     HPI   Patient presents today after a fall 4 hours ago from a ladder while cutting tree branches.  He was at a height of about 15 feet.  He had fallen onto his left side, started to get dizzy, progressively had more pain and difficulty breathing so he called EMS.  On EMS arrival, he was placed in cervical collar, given fentanyl.  He describes his shortness of breath as worse with deep breaths.  Nothing seems to make his pain better, breathing makes his pain worse.  Per EMS, his initial blood pressure systolic was in the [id number].  He arrives hemodynamically stable with a systolic in the [id number], GCS 15, saturating well on room air. Of note, patient is on plavix.  Past Medical History:  Diagnosis Date   Coronary artery disease    Prostate cancer [facility])    with radiation    Patient Active Problem List   Diagnosis Date Noted   Fall from ladder 09/14/2019     Home Medications    Prior to Admission medications   Medication Sig Start Date End Date Taking? Authorizing Provider  acetaminophen (TYLENOL) 650 MG CR tablet Take 650 mg by mouth every 8 (eight) hours as needed for pain.   Yes [provider]  atorvastatin (LIPITOR) 10 MG tablet Take 10 mg by mouth daily after supper. 05/16/19  Yes [provider]  clopidogrel (PLAVIX) 75 MG tablet Take 75 mg by mouth daily after supper. 05/19/19  Yes [provider]  famotidine (PEPCID) 40 MG tablet Take 40 mg by mouth daily after supper. 07/17/19  Yes [provider]  lisinopril (ZESTRIL) 5 MG tablet Take 5 mg by mouth daily after supper. 05/16/19  Yes [provider]  Multiple Vitamin (MULTIVITAMIN WITH MINERALS) TABS tablet Take 1 tablet by mouth daily with  breakfast.   Yes [provider]  omeprazole (PRILOSEC) 40 MG capsule Take 40 mg by mouth daily with breakfast.  08/09/19  Yes [provider]    Family History No family history on file.  Social History Social History   Tobacco Use   Smoking status: Never Smoker   Smokeless tobacco: Never Used  Substance Use Topics   Alcohol use: Never    Frequency: Never   Drug use: Never     Allergies   Patient has no known allergies.   Review of Systems Review of Systems  Constitutional: Negative for chills and fever.  Respiratory: Positive for shortness of breath. Negative for cough.   Cardiovascular: Positive for chest pain.  Gastrointestinal: Negative for abdominal pain, nausea and vomiting.  Skin: Negative for rash.  Neurological: Negative for syncope.  All other systems reviewed and are negative.    Physical Exam Updated Vital Signs BP 122/76    Pulse 89    Temp (!) 96.6 F (35.9 C) (Temporal)    Resp (!) 25    Ht 6' (1.829 m)    Wt 86.6 kg    SpO2 98%    BMI 25.90 kg/m   Physical Exam Vitals signs and nursing note reviewed.  Constitutional:      General: He is in acute distress.     Appearance: He is well-developed.  HENT:     Head: Normocephalic and atraumatic.  Eyes:     Conjunctiva/sclera: Conjunctivae normal.  Neck:     Comments: Cervical collar in place Cardiovascular:     Rate and Rhythm: Normal rate and regular rhythm.     Pulses: Normal pulses.  Pulmonary:     Effort: Pulmonary effort is normal. No respiratory distress.     Comments: Decreased breath sounds bilaterally although clear, exquisite left-sided chest wall tenderness to palpation with light touch Chest:     Chest wall: Tenderness present.  Abdominal:     Palpations: Abdomen is soft.     Tenderness: There is no abdominal tenderness.  Musculoskeletal:     Comments: Left flank hematoma present with ecchymosis  Skin:    General: Skin is warm and dry.  Neurological:      Mental Status: He is alert.     Comments: No midline spinal tenderness to palpation, moving all 4 extremities spontaneously      ED Treatments / Results  Labs (all labs ordered are listed, but only abnormal results are displayed) Labs Reviewed  COMPREHENSIVE METABOLIC PANEL - Abnormal; Notable for the following components:      Result Value   Sodium 133 (*)    CO2 20 (*)    Glucose, Bld 279 (*)    BUN 24 (*)    Creatinine, Ser 1.28 (*)    Calcium 8.1 (*)    Total Protein 5.6 (*)    Albumin 3.3 (*)    GFR calc non Af Amer 55 (*)    All other components within normal limits  CBC - Abnormal; Notable for the following components:   WBC 15.2 (*)    RBC 3.68 (*)    Hemoglobin 11.2 (*)    HCT 34.5 (*)    All other components within normal limits  I-STAT CHEM 8, ED - Abnormal; Notable for the following components:   BUN 24 (*)    Glucose, Bld 260 (*)    Calcium, Ion 1.10 (*)    TCO2 20 (*)    Hemoglobin 11.6 (*)    HCT 34.0 (*)    All other components within normal limits  SARS CORONAVIRUS 2 (TAT 6-24 HRS)  ETHANOL  PROTIME-INR  CDS SEROLOGY  URINALYSIS, ROUTINE W REFLEX MICROSCOPIC  LACTIC ACID, PLASMA  I-STAT CHEM 8, ED  TYPE AND SCREEN  PREPARE FRESH FROZEN PLASMA  PREPARE PLATELET PHERESIS  ABO/RH    EKG None  Radiology Dg Elbow 2 Views Left  Result Date: 09/14/2019 CLINICAL DATA:  Recent fall from roof with left elbow pain, initial encounter EXAM: LEFT ELBOW - 2 VIEW COMPARISON:  None. FINDINGS: Degenerative changes of the left elbow are noted. Olecranon spurs are seen. No acute fracture or dislocation is noted. No soft tissue abnormality is noted. IMPRESSION: No acute abnormality noted. Electronically Signed   By: Inez Catalina M.D.   On: 09/14/2019 22:28   Ct Head Wo Contrast  Result Date: 09/14/2019 CLINICAL DATA:  Fall 15 feet wall trimming branches with headaches and neck pain, initial encounter EXAM: CT HEAD WITHOUT CONTRAST CT CERVICAL SPINE WITHOUT  CONTRAST TECHNIQUE: Multidetector CT imaging of the head and cervical spine was performed following the standard protocol without intravenous contrast. Multiplanar CT image reconstructions of the cervical spine were also generated. COMPARISON:  06/11/2013 FINDINGS: CT HEAD FINDINGS Brain: Mild atrophic changes are noted. No findings to suggest acute hemorrhage, acute infarction or space-occupying mass lesion are seen. Vascular: Calcifications of the  distal internal carotid artery and vertebral arteries are seen. Skull: Normal. Negative for fracture or focal lesion. Sinuses/Orbits: No acute finding. Other: None. CT CERVICAL SPINE FINDINGS Alignment: Within normal limits. Skull base and vertebrae: 7 cervical segments are well visualized. Vertebral body height is well maintained. Disc space narrowing is noted at C5-6 and C6-7. Multilevel osteophytic changes are noted from C4 to T2. Facet hypertrophic changes are noted. No acute fracture or acute facet abnormality is noted. Soft tissues and spinal canal: Surrounding soft tissue structures show no acute abnormality. Air is noted within the left internal jugular vein consistent with the recent IV start. Upper chest: Upper chest demonstrates an undisplaced fracture of the left first rib adjacent to the costovertebral junction. Suggestion of undisplaced left second rib fracture centrally is noted. Tiny left pneumothorax is seen. Other: None IMPRESSION: CT of the head: Chronic atrophic changes without acute intracranial abnormality. CT of the cervical spine: Multilevel degenerative change without acute abnormality in the cervical spine. Fracture of the posterior aspect of the left first rib with associated small pneumothorax. This is better visualized on the CT of the chest. Critical Value/emergent results were called by telephone at the time of interpretation on 09/14/2019 at 9:58 pm to Dr. Laverta Baltimore , who verbally acknowledged these results. Electronically Signed   By: Inez Catalina M.D.   On: 09/14/2019 22:00   Ct Chest W Contrast  Result Date: 09/14/2019 CLINICAL DATA:  Fall EXAM: CT CHEST, ABDOMEN, AND PELVIS WITH CONTRAST TECHNIQUE: Multidetector CT imaging of the chest, abdomen and pelvis was performed following the standard protocol during bolus administration of intravenous contrast. CONTRAST:  157mL OMNIPAQUE IOHEXOL 300 MG/ML  SOLN COMPARISON:  None. FINDINGS: CT CHEST FINDINGS Cardiovascular: Heart is normal size. Coronary artery and aortic calcifications. Aorta is slightly aneurysmal at 4.1 cm within the ascending thoracic aorta. No dissection or evidence of aortic injury. Mediastinum/Nodes: No mediastinal, hilar, or axillary adenopathy. Lungs/Pleura: Moderate-sized left pneumothorax. Small left pleural effusion. Airspace opacity noted in the left lower lobe, likely a combination of atelectasis and contusion. Dependent atelectasis in the right lung. Musculoskeletal: Fracture through the lateral left 4th through 9th ribs and the anterior 3rd through 6th ribs on the left. Fracture through the posterior left 1st, 9th through 11th ribs and the left T9 and T10 transverse processes. CT ABDOMEN PELVIS FINDINGS Hepatobiliary: No hepatic injury or perihepatic hematoma. Gallbladder is unremarkable Pancreas: No focal abnormality or ductal dilatation. Spleen: No splenic injury or perisplenic hematoma. Adrenals/Urinary Tract: No adrenal hemorrhage or renal injury identified. Bladder is unremarkable. Nonobstructing stones in the kidneys bilaterally. No hydronephrosis. Stomach/Bowel: Large stool burden throughout the colon. Stomach and small bowel decompressed. Appendix is normal. Vascular/Lymphatic: Aortic atherosclerosis. No enlarged abdominal or pelvic lymph nodes. Reproductive: Prostate calcifications. Other: No free fluid or free air. Musculoskeletal: There are fractures through the left L3 and L4 transverse processes. Large hematoma within the left flank and lateral abdominal wall  soft tissues as well as in the left oblique muscles. IMPRESSION: Multiple left rib fractures including the lateral 4th through 9th ribs, the anterior 3rd through 6th ribs, and the posterior left 1st, 9th through 11th ribs. Fractures through the left T9 and T10 transverse processes. Associated moderate left pneumothorax and small left pleural effusion. Likely a combination of atelectasis and contusion in the left lower lobe. Fractures through the left L3 and L4 transverse processes. Large hematoma within the left flank subcutaneous soft tissues extending into the lateral left pelvis. There is also likely intramuscular hematoma  and involving the left oblique muscles. Large stool burden in the colon. Nephrolithiasis. Aortic atherosclerosis. These results were were discussed in person on 09/14/2019 at 9:58 pm with provider MATTHEW WAKEFIELD , who verbally acknowledged these results. Electronically Signed   By: Rolm Baptise M.D.   On: 09/14/2019 21:58   Ct Cervical Spine Wo Contrast  Result Date: 09/14/2019 CLINICAL DATA:  Fall 15 feet wall trimming branches with headaches and neck pain, initial encounter EXAM: CT HEAD WITHOUT CONTRAST CT CERVICAL SPINE WITHOUT CONTRAST TECHNIQUE: Multidetector CT imaging of the head and cervical spine was performed following the standard protocol without intravenous contrast. Multiplanar CT image reconstructions of the cervical spine were also generated. COMPARISON:  06/11/2013 FINDINGS: CT HEAD FINDINGS Brain: Mild atrophic changes are noted. No findings to suggest acute hemorrhage, acute infarction or space-occupying mass lesion are seen. Vascular: Calcifications of the distal internal carotid artery and vertebral arteries are seen. Skull: Normal. Negative for fracture or focal lesion. Sinuses/Orbits: No acute finding. Other: None. CT CERVICAL SPINE FINDINGS Alignment: Within normal limits. Skull base and vertebrae: 7 cervical segments are well visualized. Vertebral body height  is well maintained. Disc space narrowing is noted at C5-6 and C6-7. Multilevel osteophytic changes are noted from C4 to T2. Facet hypertrophic changes are noted. No acute fracture or acute facet abnormality is noted. Soft tissues and spinal canal: Surrounding soft tissue structures show no acute abnormality. Air is noted within the left internal jugular vein consistent with the recent IV start. Upper chest: Upper chest demonstrates an undisplaced fracture of the left first rib adjacent to the costovertebral junction. Suggestion of undisplaced left second rib fracture centrally is noted. Tiny left pneumothorax is seen. Other: None IMPRESSION: CT of the head: Chronic atrophic changes without acute intracranial abnormality. CT of the cervical spine: Multilevel degenerative change without acute abnormality in the cervical spine. Fracture of the posterior aspect of the left first rib with associated small pneumothorax. This is better visualized on the CT of the chest. Critical Value/emergent results were called by telephone at the time of interpretation on 09/14/2019 at 9:58 pm to Dr. Laverta Baltimore , who verbally acknowledged these results. Electronically Signed   By: Inez Catalina M.D.   On: 09/14/2019 22:00   Ct Abdomen Pelvis W Contrast  Result Date: 09/14/2019 CLINICAL DATA:  Fall EXAM: CT CHEST, ABDOMEN, AND PELVIS WITH CONTRAST TECHNIQUE: Multidetector CT imaging of the chest, abdomen and pelvis was performed following the standard protocol during bolus administration of intravenous contrast. CONTRAST:  14mL OMNIPAQUE IOHEXOL 300 MG/ML  SOLN COMPARISON:  None. FINDINGS: CT CHEST FINDINGS Cardiovascular: Heart is normal size. Coronary artery and aortic calcifications. Aorta is slightly aneurysmal at 4.1 cm within the ascending thoracic aorta. No dissection or evidence of aortic injury. Mediastinum/Nodes: No mediastinal, hilar, or axillary adenopathy. Lungs/Pleura: Moderate-sized left pneumothorax. Small left pleural  effusion. Airspace opacity noted in the left lower lobe, likely a combination of atelectasis and contusion. Dependent atelectasis in the right lung. Musculoskeletal: Fracture through the lateral left 4th through 9th ribs and the anterior 3rd through 6th ribs on the left. Fracture through the posterior left 1st, 9th through 11th ribs and the left T9 and T10 transverse processes. CT ABDOMEN PELVIS FINDINGS Hepatobiliary: No hepatic injury or perihepatic hematoma. Gallbladder is unremarkable Pancreas: No focal abnormality or ductal dilatation. Spleen: No splenic injury or perisplenic hematoma. Adrenals/Urinary Tract: No adrenal hemorrhage or renal injury identified. Bladder is unremarkable. Nonobstructing stones in the kidneys bilaterally. No hydronephrosis. Stomach/Bowel: Large stool burden  throughout the colon. Stomach and small bowel decompressed. Appendix is normal. Vascular/Lymphatic: Aortic atherosclerosis. No enlarged abdominal or pelvic lymph nodes. Reproductive: Prostate calcifications. Other: No free fluid or free air. Musculoskeletal: There are fractures through the left L3 and L4 transverse processes. Large hematoma within the left flank and lateral abdominal wall soft tissues as well as in the left oblique muscles. IMPRESSION: Multiple left rib fractures including the lateral 4th through 9th ribs, the anterior 3rd through 6th ribs, and the posterior left 1st, 9th through 11th ribs. Fractures through the left T9 and T10 transverse processes. Associated moderate left pneumothorax and small left pleural effusion. Likely a combination of atelectasis and contusion in the left lower lobe. Fractures through the left L3 and L4 transverse processes. Large hematoma within the left flank subcutaneous soft tissues extending into the lateral left pelvis. There is also likely intramuscular hematoma and involving the left oblique muscles. Large stool burden in the colon. Nephrolithiasis. Aortic atherosclerosis. These  results were were discussed in person on 09/14/2019 at 9:58 pm with provider MATTHEW WAKEFIELD , who verbally acknowledged these results. Electronically Signed   By: Rolm Baptise M.D.   On: 09/14/2019 21:58   Dg Pelvis Portable  Result Date: 09/14/2019 CLINICAL DATA:  Acute pain due to trauma EXAM: PORTABLE PELVIS 1-2 VIEWS COMPARISON:  None. FINDINGS: There is no evidence of pelvic fracture or diastasis. No pelvic bone lesions are seen. IMPRESSION: Negative. Electronically Signed   By: Constance Holster M.D.   On: 09/14/2019 21:13   Dg Chest Port 1 View  Result Date: 09/14/2019 CLINICAL DATA:  Acute pain due to trauma EXAM: PORTABLE CHEST 1 VIEW COMPARISON:  None. FINDINGS: There are multiple displaced left-sided rib fractures involving the third through ninth ribs posteriorly on the left. No definite left-sided pneumothorax. There is atelectasis at the left lung base. There may be a small left-sided hemothorax. The heart size is normal. IMPRESSION: 1. Acute displaced fractures involving the third through ninth ribs on the left. 2. No evidence for left-sided pneumothorax. 3. Atelectasis at the left lung base. Possible small left hemothorax. Electronically Signed   By: Constance Holster M.D.   On: 09/14/2019 21:15   Dg Shoulder Left Port  Result Date: 09/14/2019 CLINICAL DATA:  Fall from roof with shoulder pain, initial encounter EXAM: LEFT SHOULDER COMPARISON:  01/27/2015 FINDINGS: Degenerative changes of the acromioclavicular joint are seen. The humeral head is high-riding likely related to underlying rotator cuff injury. Multiple left rib fractures are noted. The known pneumothorax is not well appreciated on this exam. IMPRESSION: Multiple left rib fractures. The known pneumothorax is not well appreciated. Degenerative changes about the shoulder joint with a high-riding humeral head. This is likely related to chronic rotator cuff injury. Electronically Signed   By: Inez Catalina M.D.   On:  09/14/2019 22:23    Procedures Procedures (including critical care time)  Medications Ordered in ED Medications  0.9 %  sodium chloride infusion (has no administration in time range)  iohexol (OMNIPAQUE) 300 MG/ML solution 100 mL (100 mLs Intravenous Contrast Given 09/14/19 2139)     Initial Impression / Assessment and Plan / ED Course  I have reviewed the triage vital signs and the nursing notes.  Pertinent labs & imaging results that were available during my care of the patient were reviewed by me and considered in my medical decision making (see chart for details).        DRACO DONATE is a 73 y.o. male with a past  medical history of coronary artery disease and prostate cancer presenting today status post fall from about 15 feet while trying to cut tree branches.  Labs and imaging ordered for differential diagnosis of fracture, intracranial hemorrhage, pneumothorax, hemothorax, sepsis, electrolyte abnormality.  Imaging showed multiple rib fractures in various locations on the left side from anterior, lateral, posterior.  Ribs 1 through 11 involved in some form.  Patient continues to saturate well on room air.  Cervical collar to be cleared by trauma surgery.  Plan is to admit to trauma for both pain control and moderate-sized pneumothorax. Care of patient discussed with the supervising attending.  Final Clinical Impressions(s) / ED Diagnoses   Final diagnoses:  Closed fracture of multiple ribs of left side, initial encounter  Fall, initial encounter  Hematoma of left flank, initial encounter  Traumatic pneumothorax, initial encounter  Renal insufficiency    ED Discharge Orders    None         Julianne Rice, MD 09/14/19 2247    Margette Fast, MD 09/15/19 1202

## 2019-09-14 NOTE — Progress Notes (Signed)
Responded to ED Lv 1 Trauma. Escorted wife to Consultation Room A.  Will continue to provide spiritual care as needed.  Rev. [address].

## 2019-09-14 NOTE — ED Notes (Signed)
First Unit of blood completed.  Second unit started at this time.  In ct at this time.

## 2019-09-14 NOTE — ED Triage Notes (Signed)
Brought by ems for c/o fall while trimming branches approximately 15 feet.  This happened around 1730.  Ambulatory at the scene.

## 2019-09-15 ENCOUNTER — Encounter (HOSPITAL_COMMUNITY): Payer: Self-pay | Admitting: *Deleted

## 2019-09-15 ENCOUNTER — Inpatient Hospital Stay (HOSPITAL_COMMUNITY): Payer: Medicare HMO

## 2019-09-15 LAB — BPAM PLATELET PHERESIS
Blood Product Expiration Date: 202011152359
ISSUE DATE / TIME: 202011132339
Unit Type and Rh: 7300

## 2019-09-15 LAB — PREPARE FRESH FROZEN PLASMA
Unit division: 0
Unit division: 0

## 2019-09-15 LAB — CBC WITH DIFFERENTIAL/PLATELET
Abs Immature Granulocytes: 0.03 10*3/uL (ref 0.00–0.07)
Basophils Absolute: 0 10*3/uL (ref 0.0–0.1)
Basophils Relative: 0 %
Eosinophils Absolute: 0 10*3/uL (ref 0.0–0.5)
Eosinophils Relative: 0 %
HCT: 31 % — ABNORMAL LOW (ref 39.0–52.0)
Hemoglobin: 10.3 g/dL — ABNORMAL LOW (ref 13.0–17.0)
Immature Granulocytes: 0 %
Lymphocytes Relative: 7 %
Lymphs Abs: 0.6 10*3/uL — ABNORMAL LOW (ref 0.7–4.0)
MCH: 29.3 pg (ref 26.0–34.0)
MCHC: 33.2 g/dL (ref 30.0–36.0)
MCV: 88.1 fL (ref 80.0–100.0)
Monocytes Absolute: 0.9 10*3/uL (ref 0.1–1.0)
Monocytes Relative: 11 %
Neutro Abs: 6.3 10*3/uL (ref 1.7–7.7)
Neutrophils Relative %: 82 %
Platelets: 140 10*3/uL — ABNORMAL LOW (ref 150–400)
RBC: 3.52 MIL/uL — ABNORMAL LOW (ref 4.22–5.81)
RDW: 15 % (ref 11.5–15.5)
WBC: 7.8 10*3/uL (ref 4.0–10.5)
nRBC: 0 % (ref 0.0–0.2)

## 2019-09-15 LAB — PREPARE PLATELET PHERESIS: Unit division: 0

## 2019-09-15 LAB — CBC
HCT: 30 % — ABNORMAL LOW (ref 39.0–52.0)
HCT: 29.7 % — ABNORMAL LOW (ref 39.0–52.0)
HCT: 30 % — ABNORMAL LOW (ref 39.0–52.0)
Hemoglobin: 10.1 g/dL — ABNORMAL LOW (ref 13.0–17.0)
Hemoglobin: 9.8 g/dL — ABNORMAL LOW (ref 13.0–17.0)
Hemoglobin: 9.8 g/dL — ABNORMAL LOW (ref 13.0–17.0)
MCH: 29.9 pg (ref 26.0–34.0)
MCH: 29.3 pg (ref 26.0–34.0)
MCH: 29.3 pg (ref 26.0–34.0)
MCHC: 33.7 g/dL (ref 30.0–36.0)
MCHC: 33 g/dL (ref 30.0–36.0)
MCHC: 32.7 g/dL (ref 30.0–36.0)
MCV: 88.8 fL (ref 80.0–100.0)
MCV: 88.9 fL (ref 80.0–100.0)
MCV: 89.6 fL (ref 80.0–100.0)
Platelets: 129 10*3/uL — ABNORMAL LOW (ref 150–400)
Platelets: 139 10*3/uL — ABNORMAL LOW (ref 150–400)
Platelets: 141 10*3/uL — ABNORMAL LOW (ref 150–400)
RBC: 3.38 MIL/uL — ABNORMAL LOW (ref 4.22–5.81)
RBC: 3.34 MIL/uL — ABNORMAL LOW (ref 4.22–5.81)
RBC: 3.35 MIL/uL — ABNORMAL LOW (ref 4.22–5.81)
RDW: 15.1 % (ref 11.5–15.5)
RDW: 15.1 % (ref 11.5–15.5)
RDW: 15.1 % (ref 11.5–15.5)
WBC: 7.6 10*3/uL (ref 4.0–10.5)
WBC: 6.5 10*3/uL (ref 4.0–10.5)
WBC: 6.8 10*3/uL (ref 4.0–10.5)
nRBC: 0 % (ref 0.0–0.2)
nRBC: 0 % (ref 0.0–0.2)
nRBC: 0 % (ref 0.0–0.2)

## 2019-09-15 LAB — BPAM FFP
Blood Product Expiration Date: 202011172359
Blood Product Expiration Date: 202011172359
ISSUE DATE / TIME: 202011132114
ISSUE DATE / TIME: 202011132114
Unit Type and Rh: 6200
Unit Type and Rh: 6200

## 2019-09-15 LAB — URINALYSIS, ROUTINE W REFLEX MICROSCOPIC
Bilirubin Urine: NEGATIVE
Glucose, UA: NEGATIVE mg/dL
Hgb urine dipstick: NEGATIVE
Ketones, ur: NEGATIVE mg/dL
Leukocytes,Ua: NEGATIVE
Nitrite: NEGATIVE
Protein, ur: NEGATIVE mg/dL
Specific Gravity, Urine: 1.042 — ABNORMAL HIGH (ref 1.005–1.030)
pH: 6 (ref 5.0–8.0)

## 2019-09-15 LAB — PROTIME-INR
INR: 1.2 (ref 0.8–1.2)
Prothrombin Time: 15.1 seconds (ref 11.4–15.2)

## 2019-09-15 LAB — GLUCOSE, CAPILLARY
Glucose-Capillary: 105 mg/dL — ABNORMAL HIGH (ref 70–99)
Glucose-Capillary: 112 mg/dL — ABNORMAL HIGH (ref 70–99)
Glucose-Capillary: 135 mg/dL — ABNORMAL HIGH (ref 70–99)

## 2019-09-15 LAB — BASIC METABOLIC PANEL
Anion gap: 10 (ref 5–15)
BUN: 22 mg/dL (ref 8–23)
CO2: 25 mmol/L (ref 22–32)
Calcium: 8.4 mg/dL — ABNORMAL LOW (ref 8.9–10.3)
Chloride: 102 mmol/L (ref 98–111)
Creatinine, Ser: 0.93 mg/dL (ref 0.61–1.24)
GFR calc Af Amer: >60 mL/min (ref >60)
GFR calc non Af Amer: >60 mL/min (ref >60)
Glucose, Bld: 130 mg/dL — ABNORMAL HIGH (ref 70–99)
Potassium: 4.8 mmol/L (ref 3.5–5.1)
Sodium: 137 mmol/L (ref 135–145)

## 2019-09-15 LAB — HEMOGLOBIN A1C
Hgb A1c MFr Bld: 5.8 % — ABNORMAL HIGH (ref 4.8–5.6)
Mean Plasma Glucose: 119.76 mg/dL

## 2019-09-15 LAB — CDS SEROLOGY

## 2019-09-15 LAB — MRSA PCR SCREENING: MRSA by PCR: NEGATIVE

## 2019-09-15 LAB — SARS CORONAVIRUS 2 (TAT 6-24 HRS): SARS Coronavirus 2: NEGATIVE

## 2019-09-15 MED ORDER — CHLORHEXIDINE GLUCONATE CLOTH 2 % EX PADS
6.0000 | MEDICATED_PAD | Freq: Every day | CUTANEOUS | Status: DC
  Administered 2019-09-16 – 2019-09-19 (×4): 6 via TOPICAL

## 2019-09-15 NOTE — Progress Notes (Signed)
Notified [place] regarding increased size of L hematoma. No new orders at this time. ABD binder remains in place

## 2019-09-15 NOTE — Progress Notes (Signed)
Pt c/o mid abd pain "unlike before". Unrelieved by dilaudid. Tender to light touch. Abd soft. Hematoma on L lateral abd unchanged. Dr. Kae Heller notified. No fever or change in bp. Will cont to monitor closely.

## 2019-09-15 NOTE — Progress Notes (Addendum)
Subjective/Chief Complaint: Pain controlled. Saturating well on room air.    Objective: Vital signs in last 24 hours: Temp:  [96.6 F (35.9 C)-98.3 F (36.8 C)] 98.3 F (36.8 C) (11/14 0800) Pulse Rate:  [78-101] 85 (11/14 0800) Resp:  [13-30] 13 (11/14 0800) BP: (70-141)/(50-84) 107/72 (11/14 0800) SpO2:  [92 %-99 %] 93 % (11/14 0800) Weight:  [86.6 kg] 86.6 kg (11/13 2055) Last BM Date: 09/14/19  Intake/Output from previous day: 11/13 0701 - 11/14 0700 In: 3345.4 [P.O.:25; I.V.:3320.4] Out: 625 [Urine:625] Intake/Output this shift: Total I/O In: 75 [I.V.:75] Out: -   General appearance: alert and cooperative Resp: unlabored Cardio: regular rate and rhythm GI: soft, non-tender; bowel sounds normal; no masses,  no organomegaly and l flank hematoma soft, mildly tender Extremities: extremities normal, atraumatic, no cyanosis or edema and limited abduction r shoulder and extension R elbow Pulses: 2+ and symmetric Skin: Skin color, texture, turgor normal. No rashes or lesions Neurologic: Grossly normal  Lab Results:  Recent Labs    09/14/19 2316 09/15/19 0435  WBC 12.1* 7.8  HGB 11.2* 10.3*  HCT 34.3* 31.0*  PLT 125* 140*   BMET Recent Labs    09/14/19 2053 09/14/19 2116 09/15/19 0435  NA 133* 138 137  K 3.7 3.8 4.8  CL 101 102 102  CO2 20*  --  25  GLUCOSE 279* 260* 130*  BUN 24* 24* 22  CREATININE 1.28* 1.20 0.93  CALCIUM 8.1*  --  8.4*   PT/INR Recent Labs    09/14/19 2053 09/15/19 0435  LABPROT 15.1 15.1  INR 1.2 1.2   ABG No results for input(s): PHART, HCO3 in the last 72 hours.  Invalid input(s): PCO2, PO2  Studies/Results: Dg Elbow 2 Views Left  Result Date: 09/14/2019 CLINICAL DATA:  Recent fall from roof with left elbow pain, initial encounter EXAM: LEFT ELBOW - 2 VIEW COMPARISON:  None. FINDINGS: Degenerative changes of the left elbow are noted. Olecranon spurs are seen. No acute fracture or dislocation is noted. No soft tissue  abnormality is noted. IMPRESSION: No acute abnormality noted. Electronically Signed   By: Inez Catalina M.D.   On: 09/14/2019 22:28   Ct Head Wo Contrast  Result Date: 09/14/2019 CLINICAL DATA:  Fall 15 feet wall trimming branches with headaches and neck pain, initial encounter EXAM: CT HEAD WITHOUT CONTRAST CT CERVICAL SPINE WITHOUT CONTRAST TECHNIQUE: Multidetector CT imaging of the head and cervical spine was performed following the standard protocol without intravenous contrast. Multiplanar CT image reconstructions of the cervical spine were also generated. COMPARISON:  06/11/2013 FINDINGS: CT HEAD FINDINGS Brain: Mild atrophic changes are noted. No findings to suggest acute hemorrhage, acute infarction or space-occupying mass lesion are seen. Vascular: Calcifications of the distal internal carotid artery and vertebral arteries are seen. Skull: Normal. Negative for fracture or focal lesion. Sinuses/Orbits: No acute finding. Other: None. CT CERVICAL SPINE FINDINGS Alignment: Within normal limits. Skull base and vertebrae: 7 cervical segments are well visualized. Vertebral body height is well maintained. Disc space narrowing is noted at C5-6 and C6-7. Multilevel osteophytic changes are noted from C4 to T2. Facet hypertrophic changes are noted. No acute fracture or acute facet abnormality is noted. Soft tissues and spinal canal: Surrounding soft tissue structures show no acute abnormality. Air is noted within the left internal jugular vein consistent with the recent IV start. Upper chest: Upper chest demonstrates an undisplaced fracture of the left first rib adjacent to the costovertebral junction. Suggestion of undisplaced left second rib  fracture centrally is noted. Tiny left pneumothorax is seen. Other: None IMPRESSION: CT of the head: Chronic atrophic changes without acute intracranial abnormality. CT of the cervical spine: Multilevel degenerative change without acute abnormality in the cervical spine.  Fracture of the posterior aspect of the left first rib with associated small pneumothorax. This is better visualized on the CT of the chest. Critical Value/emergent results were called by telephone at the time of interpretation on 09/14/2019 at 9:58 pm to Dr. Laverta Baltimore , who verbally acknowledged these results. Electronically Signed   By: Inez Catalina M.D.   On: 09/14/2019 22:00   Ct Chest W Contrast  Result Date: 09/14/2019 CLINICAL DATA:  Fall EXAM: CT CHEST, ABDOMEN, AND PELVIS WITH CONTRAST TECHNIQUE: Multidetector CT imaging of the chest, abdomen and pelvis was performed following the standard protocol during bolus administration of intravenous contrast. CONTRAST:  14mL OMNIPAQUE IOHEXOL 300 MG/ML  SOLN COMPARISON:  None. FINDINGS: CT CHEST FINDINGS Cardiovascular: Heart is normal size. Coronary artery and aortic calcifications. Aorta is slightly aneurysmal at 4.1 cm within the ascending thoracic aorta. No dissection or evidence of aortic injury. Mediastinum/Nodes: No mediastinal, hilar, or axillary adenopathy. Lungs/Pleura: Moderate-sized left pneumothorax. Small left pleural effusion. Airspace opacity noted in the left lower lobe, likely a combination of atelectasis and contusion. Dependent atelectasis in the right lung. Musculoskeletal: Fracture through the lateral left 4th through 9th ribs and the anterior 3rd through 6th ribs on the left. Fracture through the posterior left 1st, 9th through 11th ribs and the left T9 and T10 transverse processes. CT ABDOMEN PELVIS FINDINGS Hepatobiliary: No hepatic injury or perihepatic hematoma. Gallbladder is unremarkable Pancreas: No focal abnormality or ductal dilatation. Spleen: No splenic injury or perisplenic hematoma. Adrenals/Urinary Tract: No adrenal hemorrhage or renal injury identified. Bladder is unremarkable. Nonobstructing stones in the kidneys bilaterally. No hydronephrosis. Stomach/Bowel: Large stool burden throughout the colon. Stomach and small bowel  decompressed. Appendix is normal. Vascular/Lymphatic: Aortic atherosclerosis. No enlarged abdominal or pelvic lymph nodes. Reproductive: Prostate calcifications. Other: No free fluid or free air. Musculoskeletal: There are fractures through the left L3 and L4 transverse processes. Large hematoma within the left flank and lateral abdominal wall soft tissues as well as in the left oblique muscles. IMPRESSION: Multiple left rib fractures including the lateral 4th through 9th ribs, the anterior 3rd through 6th ribs, and the posterior left 1st, 9th through 11th ribs. Fractures through the left T9 and T10 transverse processes. Associated moderate left pneumothorax and small left pleural effusion. Likely a combination of atelectasis and contusion in the left lower lobe. Fractures through the left L3 and L4 transverse processes. Large hematoma within the left flank subcutaneous soft tissues extending into the lateral left pelvis. There is also likely intramuscular hematoma and involving the left oblique muscles. Large stool burden in the colon. Nephrolithiasis. Aortic atherosclerosis. These results were were discussed in person on 09/14/2019 at 9:58 pm with provider MATTHEW WAKEFIELD , who verbally acknowledged these results. Electronically Signed   By: Rolm Baptise M.D.   On: 09/14/2019 21:58   Ct Cervical Spine Wo Contrast  Result Date: 09/14/2019 CLINICAL DATA:  Fall 15 feet wall trimming branches with headaches and neck pain, initial encounter EXAM: CT HEAD WITHOUT CONTRAST CT CERVICAL SPINE WITHOUT CONTRAST TECHNIQUE: Multidetector CT imaging of the head and cervical spine was performed following the standard protocol without intravenous contrast. Multiplanar CT image reconstructions of the cervical spine were also generated. COMPARISON:  06/11/2013 FINDINGS: CT HEAD FINDINGS Brain: Mild atrophic changes are noted.  No findings to suggest acute hemorrhage, acute infarction or space-occupying mass lesion are seen.  Vascular: Calcifications of the distal internal carotid artery and vertebral arteries are seen. Skull: Normal. Negative for fracture or focal lesion. Sinuses/Orbits: No acute finding. Other: None. CT CERVICAL SPINE FINDINGS Alignment: Within normal limits. Skull base and vertebrae: 7 cervical segments are well visualized. Vertebral body height is well maintained. Disc space narrowing is noted at C5-6 and C6-7. Multilevel osteophytic changes are noted from C4 to T2. Facet hypertrophic changes are noted. No acute fracture or acute facet abnormality is noted. Soft tissues and spinal canal: Surrounding soft tissue structures show no acute abnormality. Air is noted within the left internal jugular vein consistent with the recent IV start. Upper chest: Upper chest demonstrates an undisplaced fracture of the left first rib adjacent to the costovertebral junction. Suggestion of undisplaced left second rib fracture centrally is noted. Tiny left pneumothorax is seen. Other: None IMPRESSION: CT of the head: Chronic atrophic changes without acute intracranial abnormality. CT of the cervical spine: Multilevel degenerative change without acute abnormality in the cervical spine. Fracture of the posterior aspect of the left first rib with associated small pneumothorax. This is better visualized on the CT of the chest. Critical Value/emergent results were called by telephone at the time of interpretation on 09/14/2019 at 9:58 pm to Dr. Laverta Baltimore , who verbally acknowledged these results. Electronically Signed   By: Inez Catalina M.D.   On: 09/14/2019 22:00   Ct Abdomen Pelvis W Contrast  Result Date: 09/14/2019 CLINICAL DATA:  Fall EXAM: CT CHEST, ABDOMEN, AND PELVIS WITH CONTRAST TECHNIQUE: Multidetector CT imaging of the chest, abdomen and pelvis was performed following the standard protocol during bolus administration of intravenous contrast. CONTRAST:  151mL OMNIPAQUE IOHEXOL 300 MG/ML  SOLN COMPARISON:  None. FINDINGS: CT CHEST  FINDINGS Cardiovascular: Heart is normal size. Coronary artery and aortic calcifications. Aorta is slightly aneurysmal at 4.1 cm within the ascending thoracic aorta. No dissection or evidence of aortic injury. Mediastinum/Nodes: No mediastinal, hilar, or axillary adenopathy. Lungs/Pleura: Moderate-sized left pneumothorax. Small left pleural effusion. Airspace opacity noted in the left lower lobe, likely a combination of atelectasis and contusion. Dependent atelectasis in the right lung. Musculoskeletal: Fracture through the lateral left 4th through 9th ribs and the anterior 3rd through 6th ribs on the left. Fracture through the posterior left 1st, 9th through 11th ribs and the left T9 and T10 transverse processes. CT ABDOMEN PELVIS FINDINGS Hepatobiliary: No hepatic injury or perihepatic hematoma. Gallbladder is unremarkable Pancreas: No focal abnormality or ductal dilatation. Spleen: No splenic injury or perisplenic hematoma. Adrenals/Urinary Tract: No adrenal hemorrhage or renal injury identified. Bladder is unremarkable. Nonobstructing stones in the kidneys bilaterally. No hydronephrosis. Stomach/Bowel: Large stool burden throughout the colon. Stomach and small bowel decompressed. Appendix is normal. Vascular/Lymphatic: Aortic atherosclerosis. No enlarged abdominal or pelvic lymph nodes. Reproductive: Prostate calcifications. Other: No free fluid or free air. Musculoskeletal: There are fractures through the left L3 and L4 transverse processes. Large hematoma within the left flank and lateral abdominal wall soft tissues as well as in the left oblique muscles. IMPRESSION: Multiple left rib fractures including the lateral 4th through 9th ribs, the anterior 3rd through 6th ribs, and the posterior left 1st, 9th through 11th ribs. Fractures through the left T9 and T10 transverse processes. Associated moderate left pneumothorax and small left pleural effusion. Likely a combination of atelectasis and contusion in the  left lower lobe. Fractures through the left L3 and L4 transverse processes. Large  hematoma within the left flank subcutaneous soft tissues extending into the lateral left pelvis. There is also likely intramuscular hematoma and involving the left oblique muscles. Large stool burden in the colon. Nephrolithiasis. Aortic atherosclerosis. These results were were discussed in person on 09/14/2019 at 9:58 pm with provider MATTHEW WAKEFIELD , who verbally acknowledged these results. Electronically Signed   By: Rolm Baptise M.D.   On: 09/14/2019 21:58   Dg Pelvis Portable  Result Date: 09/14/2019 CLINICAL DATA:  Acute pain due to trauma EXAM: PORTABLE PELVIS 1-2 VIEWS COMPARISON:  None. FINDINGS: There is no evidence of pelvic fracture or diastasis. No pelvic bone lesions are seen. IMPRESSION: Negative. Electronically Signed   By: Constance Holster M.D.   On: 09/14/2019 21:13   Dg Chest Port 1 View  Result Date: 09/14/2019 CLINICAL DATA:  Acute pain due to trauma EXAM: PORTABLE CHEST 1 VIEW COMPARISON:  None. FINDINGS: There are multiple displaced left-sided rib fractures involving the third through ninth ribs posteriorly on the left. No definite left-sided pneumothorax. There is atelectasis at the left lung base. There may be a small left-sided hemothorax. The heart size is normal. IMPRESSION: 1. Acute displaced fractures involving the third through ninth ribs on the left. 2. No evidence for left-sided pneumothorax. 3. Atelectasis at the left lung base. Possible small left hemothorax. Electronically Signed   By: Constance Holster M.D.   On: 09/14/2019 21:15   Dg Shoulder Left Port  Result Date: 09/14/2019 CLINICAL DATA:  Fall from roof with shoulder pain, initial encounter EXAM: LEFT SHOULDER COMPARISON:  01/27/2015 FINDINGS: Degenerative changes of the acromioclavicular joint are seen. The humeral head is high-riding likely related to underlying rotator cuff injury. Multiple left rib fractures are noted.  The known pneumothorax is not well appreciated on this exam. IMPRESSION: Multiple left rib fractures. The known pneumothorax is not well appreciated. Degenerative changes about the shoulder joint with a high-riding humeral head. This is likely related to chronic rotator cuff injury. Electronically Signed   By: Inez Catalina M.D.   On: 09/14/2019 22:23    Anti-infectives: Anti-infectives (From admission, onward)   None      Assessment/Plan: Fall Left 3-9 rib fx, pulm contusion,anterior ptx- pulm toilet, monitor sats, pain control, CXR stable this AM and respiratory status good, no chest tube will repeat cxr tomorrow. Large left flank hematoma- received 2 units prbcs and 2 units ffp with platelets in er due to low bp and plavix.  Responded well.  Will place binder and follow hct for this.  No operative intervention indicated L3 and L4 tp fx- pain control RUE pain- check plain films No lovenox now due to bleeding, scds Monitor in icu today, floor tomorrow  LOS: 1 day    Clovis Riley 09/15/2019

## 2019-09-16 ENCOUNTER — Inpatient Hospital Stay (HOSPITAL_COMMUNITY): Payer: Medicare HMO

## 2019-09-16 LAB — GLUCOSE, CAPILLARY
Glucose-Capillary: 114 mg/dL — ABNORMAL HIGH (ref 70–99)
Glucose-Capillary: 124 mg/dL — ABNORMAL HIGH (ref 70–99)
Glucose-Capillary: 95 mg/dL (ref 70–99)
Glucose-Capillary: 110 mg/dL — ABNORMAL HIGH (ref 70–99)

## 2019-09-16 LAB — CBC
HCT: 28.2 % — ABNORMAL LOW (ref 39.0–52.0)
Hemoglobin: 9.5 g/dL — ABNORMAL LOW (ref 13.0–17.0)
MCH: 30 pg (ref 26.0–34.0)
MCHC: 33.7 g/dL (ref 30.0–36.0)
MCV: 89 fL (ref 80.0–100.0)
Platelets: 121 10*3/uL — ABNORMAL LOW (ref 150–400)
RBC: 3.17 MIL/uL — ABNORMAL LOW (ref 4.22–5.81)
RDW: 14.8 % (ref 11.5–15.5)
WBC: 6 10*3/uL (ref 4.0–10.5)
nRBC: 0 % (ref 0.0–0.2)

## 2019-09-16 MED ORDER — FAMOTIDINE 20 MG PO TABS
40.0000 mg | ORAL_TABLET | Freq: Every day | ORAL | Status: DC
  Administered 2019-09-16: 40 mg via ORAL
  Filled 2019-09-16: qty 2

## 2019-09-16 MED ORDER — ATORVASTATIN CALCIUM 10 MG PO TABS
10.0000 mg | ORAL_TABLET | Freq: Every day | ORAL | Status: DC
  Administered 2019-09-16 – 2019-09-18 (×3): 10 mg via ORAL
  Filled 2019-09-16 (×3): qty 1

## 2019-09-16 MED ORDER — MIDAZOLAM HCL 2 MG/2ML IJ SOLN
2.0000 mg | Freq: Once | INTRAMUSCULAR | Status: AC
  Administered 2019-09-16: 2 mg via INTRAVENOUS

## 2019-09-16 MED ORDER — FENTANYL CITRATE (PF) 100 MCG/2ML IJ SOLN
INTRAMUSCULAR | Status: AC
  Administered 2019-09-16: 100 ug
  Filled 2019-09-16: qty 2

## 2019-09-16 MED ORDER — MIDAZOLAM HCL 2 MG/2ML IJ SOLN
INTRAMUSCULAR | Status: AC
  Administered 2019-09-16: 2 mg
  Filled 2019-09-16: qty 2

## 2019-09-16 MED ORDER — LISINOPRIL 5 MG PO TABS
5.0000 mg | ORAL_TABLET | Freq: Every day | ORAL | Status: DC
  Administered 2019-09-16 – 2019-09-18 (×3): 5 mg via ORAL
  Filled 2019-09-16 (×3): qty 1

## 2019-09-16 MED ORDER — FENTANYL CITRATE (PF) 100 MCG/2ML IJ SOLN
50.0000 ug | Freq: Once | INTRAMUSCULAR | Status: AC
  Administered 2019-09-16: 50 ug via INTRAVENOUS

## 2019-09-16 MED ORDER — PANTOPRAZOLE SODIUM 40 MG PO TBEC
40.0000 mg | DELAYED_RELEASE_TABLET | Freq: Every day | ORAL | Status: DC
  Administered 2019-09-16 – 2019-09-19 (×4): 40 mg via ORAL
  Filled 2019-09-16 (×4): qty 1

## 2019-09-16 MED ORDER — LIDOCAINE HCL 1 % IJ SOLN
5.0000 mL | Freq: Once | INTRAMUSCULAR | Status: AC
  Administered 2019-09-16: 5 mL via INTRADERMAL

## 2019-09-16 MED ORDER — LIDOCAINE HCL (PF) 1 % IJ SOLN
INTRAMUSCULAR | Status: AC
  Filled 2019-09-16: qty 5

## 2019-09-16 NOTE — Progress Notes (Signed)
Subjective/Chief Complaint: No complaints. On oxygen this am   Objective: Vital signs in last 24 hours: Temp:  [97.9 F (36.6 C)-99 F (37.2 C)] 98.5 F (36.9 C) (11/15 0800) Pulse Rate:  [75-95] 84 (11/15 0700) Resp:  [14-27] 20 (11/15 0700) BP: (92-134)/(62-78) 131/71 (11/15 0700) SpO2:  [88 %-97 %] 95 % (11/15 0700) Last BM Date: 09/14/19  Intake/Output from previous day: 11/14 0701 - 11/15 0700 In: 882.7 [P.O.:640; I.V.:242.7] Out: -  Intake/Output this shift: No intake/output data recorded.  General nad Tender left chest Abdomen mildly tender this is hematoma from flank progessing  Lab Results:  Recent Labs    09/15/19 1634 09/15/19 2310  WBC 6.5 6.8  HGB 9.8* 9.8*  HCT 29.7* 30.0*  PLT 139* 141*   BMET Recent Labs    09/14/19 2053 09/14/19 2116 09/15/19 0435  NA 133* 138 137  K 3.7 3.8 4.8  CL 101 102 102  CO2 20*  --  25  GLUCOSE 279* 260* 130*  BUN 24* 24* 22  CREATININE 1.28* 1.20 0.93  CALCIUM 8.1*  --  8.4*   PT/INR Recent Labs    09/14/19 2053 09/15/19 0435  LABPROT 15.1 15.1  INR 1.2 1.2   ABG No results for input(s): PHART, HCO3 in the last 72 hours.  Invalid input(s): PCO2, PO2  Studies/Results: Dg Shoulder Right  Result Date: 09/15/2019 CLINICAL DATA:  Acute onset of right shoulder pain. Limited mobility. EXAM: RIGHT SHOULDER - 2+ VIEW COMPARISON:  None. FINDINGS: No fracture or dislocation. Moderate to severe degenerative change of the right glenohumeral and acromioclavicular joints with joint space loss, subchondral sclerosis and osteophytosis. The humeral head is high-riding in relation to the undersurface of the acromion. No definite evidence of calcific tendinitis. Limited visualization of the adjacent thorax is normal. Regional soft tissues appear normal. IMPRESSION: 1. No definite acute findings. 2. Moderate to severe degenerative change the right glenohumeral and acromioclavicular joints. 3. The humeral head is  high-riding in relation to the undersurface of the acromion, nonspecific though could be seen in the setting of rotator cuff tear/pathology. Electronically Signed   By: Sandi Mariscal M.D.   On: 09/15/2019 11:36   Dg Elbow 2 Views Left  Result Date: 09/14/2019 CLINICAL DATA:  Recent fall from roof with left elbow pain, initial encounter EXAM: LEFT ELBOW - 2 VIEW COMPARISON:  None. FINDINGS: Degenerative changes of the left elbow are noted. Olecranon spurs are seen. No acute fracture or dislocation is noted. No soft tissue abnormality is noted. IMPRESSION: No acute abnormality noted. Electronically Signed   By: Inez Catalina M.D.   On: 09/14/2019 22:28   Dg Elbow 2 Views Right  Result Date: 09/15/2019 CLINICAL DATA:  Sudden onset right elbow pain.  Decreased mobility. EXAM: RIGHT ELBOW - 2 VIEW COMPARISON:  None. FINDINGS: No definite fracture or elbow joint effusion. Prominent enthesopathic change involving the olecranon process. Joint spaces appear preserved. Peripherally corticated tiny ossicles adjacent to the lateral epicondyle likely represents sequela remote avulsive injury. Regional soft tissues appear normal. No radiopaque foreign body. IMPRESSION: 1. No acute findings. 2. Prominent enthesopathic change of the olecranon process, likely the sequela of remote avulsive injury. Electronically Signed   By: Sandi Mariscal M.D.   On: 09/15/2019 11:39   Ct Head Wo Contrast  Result Date: 09/14/2019 CLINICAL DATA:  Fall 15 feet wall trimming branches with headaches and neck pain, initial encounter EXAM: CT HEAD WITHOUT CONTRAST CT CERVICAL SPINE WITHOUT CONTRAST TECHNIQUE: Multidetector CT imaging  of the head and cervical spine was performed following the standard protocol without intravenous contrast. Multiplanar CT image reconstructions of the cervical spine were also generated. COMPARISON:  06/11/2013 FINDINGS: CT HEAD FINDINGS Brain: Mild atrophic changes are noted. No findings to suggest acute hemorrhage,  acute infarction or space-occupying mass lesion are seen. Vascular: Calcifications of the distal internal carotid artery and vertebral arteries are seen. Skull: Normal. Negative for fracture or focal lesion. Sinuses/Orbits: No acute finding. Other: None. CT CERVICAL SPINE FINDINGS Alignment: Within normal limits. Skull base and vertebrae: 7 cervical segments are well visualized. Vertebral body height is well maintained. Disc space narrowing is noted at C5-6 and C6-7. Multilevel osteophytic changes are noted from C4 to T2. Facet hypertrophic changes are noted. No acute fracture or acute facet abnormality is noted. Soft tissues and spinal canal: Surrounding soft tissue structures show no acute abnormality. Air is noted within the left internal jugular vein consistent with the recent IV start. Upper chest: Upper chest demonstrates an undisplaced fracture of the left first rib adjacent to the costovertebral junction. Suggestion of undisplaced left second rib fracture centrally is noted. Tiny left pneumothorax is seen. Other: None IMPRESSION: CT of the head: Chronic atrophic changes without acute intracranial abnormality. CT of the cervical spine: Multilevel degenerative change without acute abnormality in the cervical spine. Fracture of the posterior aspect of the left first rib with associated small pneumothorax. This is better visualized on the CT of the chest. Critical Value/emergent results were called by telephone at the time of interpretation on 09/14/2019 at 9:58 pm to Dr. Laverta Baltimore , who verbally acknowledged these results. Electronically Signed   By: Inez Catalina M.D.   On: 09/14/2019 22:00   Ct Chest W Contrast  Result Date: 09/14/2019 CLINICAL DATA:  Fall EXAM: CT CHEST, ABDOMEN, AND PELVIS WITH CONTRAST TECHNIQUE: Multidetector CT imaging of the chest, abdomen and pelvis was performed following the standard protocol during bolus administration of intravenous contrast. CONTRAST:  130mL OMNIPAQUE IOHEXOL 300  MG/ML  SOLN COMPARISON:  None. FINDINGS: CT CHEST FINDINGS Cardiovascular: Heart is normal size. Coronary artery and aortic calcifications. Aorta is slightly aneurysmal at 4.1 cm within the ascending thoracic aorta. No dissection or evidence of aortic injury. Mediastinum/Nodes: No mediastinal, hilar, or axillary adenopathy. Lungs/Pleura: Moderate-sized left pneumothorax. Small left pleural effusion. Airspace opacity noted in the left lower lobe, likely a combination of atelectasis and contusion. Dependent atelectasis in the right lung. Musculoskeletal: Fracture through the lateral left 4th through 9th ribs and the anterior 3rd through 6th ribs on the left. Fracture through the posterior left 1st, 9th through 11th ribs and the left T9 and T10 transverse processes. CT ABDOMEN PELVIS FINDINGS Hepatobiliary: No hepatic injury or perihepatic hematoma. Gallbladder is unremarkable Pancreas: No focal abnormality or ductal dilatation. Spleen: No splenic injury or perisplenic hematoma. Adrenals/Urinary Tract: No adrenal hemorrhage or renal injury identified. Bladder is unremarkable. Nonobstructing stones in the kidneys bilaterally. No hydronephrosis. Stomach/Bowel: Large stool burden throughout the colon. Stomach and small bowel decompressed. Appendix is normal. Vascular/Lymphatic: Aortic atherosclerosis. No enlarged abdominal or pelvic lymph nodes. Reproductive: Prostate calcifications. Other: No free fluid or free air. Musculoskeletal: There are fractures through the left L3 and L4 transverse processes. Large hematoma within the left flank and lateral abdominal wall soft tissues as well as in the left oblique muscles. IMPRESSION: Multiple left rib fractures including the lateral 4th through 9th ribs, the anterior 3rd through 6th ribs, and the posterior left 1st, 9th through 11th ribs. Fractures through the  left T9 and T10 transverse processes. Associated moderate left pneumothorax and small left pleural effusion. Likely a  combination of atelectasis and contusion in the left lower lobe. Fractures through the left L3 and L4 transverse processes. Large hematoma within the left flank subcutaneous soft tissues extending into the lateral left pelvis. There is also likely intramuscular hematoma and involving the left oblique muscles. Large stool burden in the colon. Nephrolithiasis. Aortic atherosclerosis. These results were were discussed in person on 09/14/2019 at 9:58 pm with provider Brandace Cargle , who verbally acknowledged these results. Electronically Signed   By: Rolm Baptise M.D.   On: 09/14/2019 21:58   Ct Cervical Spine Wo Contrast  Result Date: 09/14/2019 CLINICAL DATA:  Fall 15 feet wall trimming branches with headaches and neck pain, initial encounter EXAM: CT HEAD WITHOUT CONTRAST CT CERVICAL SPINE WITHOUT CONTRAST TECHNIQUE: Multidetector CT imaging of the head and cervical spine was performed following the standard protocol without intravenous contrast. Multiplanar CT image reconstructions of the cervical spine were also generated. COMPARISON:  06/11/2013 FINDINGS: CT HEAD FINDINGS Brain: Mild atrophic changes are noted. No findings to suggest acute hemorrhage, acute infarction or space-occupying mass lesion are seen. Vascular: Calcifications of the distal internal carotid artery and vertebral arteries are seen. Skull: Normal. Negative for fracture or focal lesion. Sinuses/Orbits: No acute finding. Other: None. CT CERVICAL SPINE FINDINGS Alignment: Within normal limits. Skull base and vertebrae: 7 cervical segments are well visualized. Vertebral body height is well maintained. Disc space narrowing is noted at C5-6 and C6-7. Multilevel osteophytic changes are noted from C4 to T2. Facet hypertrophic changes are noted. No acute fracture or acute facet abnormality is noted. Soft tissues and spinal canal: Surrounding soft tissue structures show no acute abnormality. Air is noted within the left internal jugular vein  consistent with the recent IV start. Upper chest: Upper chest demonstrates an undisplaced fracture of the left first rib adjacent to the costovertebral junction. Suggestion of undisplaced left second rib fracture centrally is noted. Tiny left pneumothorax is seen. Other: None IMPRESSION: CT of the head: Chronic atrophic changes without acute intracranial abnormality. CT of the cervical spine: Multilevel degenerative change without acute abnormality in the cervical spine. Fracture of the posterior aspect of the left first rib with associated small pneumothorax. This is better visualized on the CT of the chest. Critical Value/emergent results were called by telephone at the time of interpretation on 09/14/2019 at 9:58 pm to Dr. Laverta Baltimore , who verbally acknowledged these results. Electronically Signed   By: Inez Catalina M.D.   On: 09/14/2019 22:00   Ct Abdomen Pelvis W Contrast  Result Date: 09/14/2019 CLINICAL DATA:  Fall EXAM: CT CHEST, ABDOMEN, AND PELVIS WITH CONTRAST TECHNIQUE: Multidetector CT imaging of the chest, abdomen and pelvis was performed following the standard protocol during bolus administration of intravenous contrast. CONTRAST:  136mL OMNIPAQUE IOHEXOL 300 MG/ML  SOLN COMPARISON:  None. FINDINGS: CT CHEST FINDINGS Cardiovascular: Heart is normal size. Coronary artery and aortic calcifications. Aorta is slightly aneurysmal at 4.1 cm within the ascending thoracic aorta. No dissection or evidence of aortic injury. Mediastinum/Nodes: No mediastinal, hilar, or axillary adenopathy. Lungs/Pleura: Moderate-sized left pneumothorax. Small left pleural effusion. Airspace opacity noted in the left lower lobe, likely a combination of atelectasis and contusion. Dependent atelectasis in the right lung. Musculoskeletal: Fracture through the lateral left 4th through 9th ribs and the anterior 3rd through 6th ribs on the left. Fracture through the posterior left 1st, 9th through 11th ribs and the left  T9 and T10  transverse processes. CT ABDOMEN PELVIS FINDINGS Hepatobiliary: No hepatic injury or perihepatic hematoma. Gallbladder is unremarkable Pancreas: No focal abnormality or ductal dilatation. Spleen: No splenic injury or perisplenic hematoma. Adrenals/Urinary Tract: No adrenal hemorrhage or renal injury identified. Bladder is unremarkable. Nonobstructing stones in the kidneys bilaterally. No hydronephrosis. Stomach/Bowel: Large stool burden throughout the colon. Stomach and small bowel decompressed. Appendix is normal. Vascular/Lymphatic: Aortic atherosclerosis. No enlarged abdominal or pelvic lymph nodes. Reproductive: Prostate calcifications. Other: No free fluid or free air. Musculoskeletal: There are fractures through the left L3 and L4 transverse processes. Large hematoma within the left flank and lateral abdominal wall soft tissues as well as in the left oblique muscles. IMPRESSION: Multiple left rib fractures including the lateral 4th through 9th ribs, the anterior 3rd through 6th ribs, and the posterior left 1st, 9th through 11th ribs. Fractures through the left T9 and T10 transverse processes. Associated moderate left pneumothorax and small left pleural effusion. Likely a combination of atelectasis and contusion in the left lower lobe. Fractures through the left L3 and L4 transverse processes. Large hematoma within the left flank subcutaneous soft tissues extending into the lateral left pelvis. There is also likely intramuscular hematoma and involving the left oblique muscles. Large stool burden in the colon. Nephrolithiasis. Aortic atherosclerosis. These results were were discussed in person on 09/14/2019 at 9:58 pm with provider Jagjit Riner , who verbally acknowledged these results. Electronically Signed   By: Rolm Baptise M.D.   On: 09/14/2019 21:58   Dg Pelvis Portable  Result Date: 09/14/2019 CLINICAL DATA:  Acute pain due to trauma EXAM: PORTABLE PELVIS 1-2 VIEWS COMPARISON:  None. FINDINGS:  There is no evidence of pelvic fracture or diastasis. No pelvic bone lesions are seen. IMPRESSION: Negative. Electronically Signed   By: Constance Holster M.D.   On: 09/14/2019 21:13   Dg Chest Port 1 View  Result Date: 09/15/2019 CLINICAL DATA:  Pneumothorax.  Injury. EXAM: PORTABLE CHEST 1 VIEW COMPARISON:  09/14/2019 FINDINGS: Progression of left pneumothorax which was not present in the apex as well as laterally. Estimated 20% left pneumothorax. No significant pleural effusion. Multiple left rib fractures. Progression mild left lower lobe atelectasis. Right lung clear. Negative for heart failure. IMPRESSION: Progression of left pneumothorax now approximately 20%. Progression of left lower lobe atelectasis. No significant effusion. Electronically Signed   By: Franchot Gallo M.D.   On: 09/15/2019 10:28   Dg Chest Port 1 View  Result Date: 09/14/2019 CLINICAL DATA:  Acute pain due to trauma EXAM: PORTABLE CHEST 1 VIEW COMPARISON:  None. FINDINGS: There are multiple displaced left-sided rib fractures involving the third through ninth ribs posteriorly on the left. No definite left-sided pneumothorax. There is atelectasis at the left lung base. There may be a small left-sided hemothorax. The heart size is normal. IMPRESSION: 1. Acute displaced fractures involving the third through ninth ribs on the left. 2. No evidence for left-sided pneumothorax. 3. Atelectasis at the left lung base. Possible small left hemothorax. Electronically Signed   By: Constance Holster M.D.   On: 09/14/2019 21:15   Dg Shoulder Left Port  Result Date: 09/14/2019 CLINICAL DATA:  Fall from roof with shoulder pain, initial encounter EXAM: LEFT SHOULDER COMPARISON:  01/27/2015 FINDINGS: Degenerative changes of the acromioclavicular joint are seen. The humeral head is high-riding likely related to underlying rotator cuff injury. Multiple left rib fractures are noted. The known pneumothorax is not well appreciated on this exam.  IMPRESSION: Multiple left rib fractures. The known  pneumothorax is not well appreciated. Degenerative changes about the shoulder joint with a high-riding humeral head. This is likely related to chronic rotator cuff injury. Electronically Signed   By: Inez Catalina M.D.   On: 09/14/2019 22:23    Anti-infectives: Anti-infectives (From admission, onward)   None      Assessment/Plan: Fall Left 3-9 rib fx, pulm contusion,anterior ptx- pulm toilet, monitor sats, pain control, cxr with visible ptx needs chest tube today Large left flank hematoma- received 2 units prbcs and 2 units ffp with platelets in er due to low bp and plavix. Responded well. Will place binder and follow hct for this. No operative intervention indicated L3 and L4 tp fx- pain control No lovenox now due to bleeding, scds   Rolm Bookbinder 09/16/2019

## 2019-09-16 NOTE — Procedures (Signed)
Preop dx: left htpx Postop dx: same as above Procedure: left 20 Fr tube thoracostomy Dr Donne Hazel EBL: minimal Complications none  Indications: This is a 58 yom with left rib fx and increasing htpx. I discussed left chest tube.  Procedure: informed consent obtained.  I prepped and draped left chest.   I then infiltrated lidocaine and made an incision. I entered the chest with return of blood and air. I then inserted a 20 Fr chest tube. This was secured with 0 silk suture. Dressing placed. Tolerated well. cxr pending

## 2019-09-16 NOTE — Progress Notes (Addendum)
Patient ID: Randy Myers, male   DOB: [birth year], 73 y.o.   MRN: [id number] Got air out with chest tube placement will leave on suction and repeat in am, is tidalling and blood draining  Checked tube again on suction, no air leak and is functional, hopefully xray better in am

## 2019-09-17 ENCOUNTER — Inpatient Hospital Stay (HOSPITAL_COMMUNITY): Payer: Medicare HMO

## 2019-09-17 LAB — CBC
HCT: 28.3 % — ABNORMAL LOW (ref 39.0–52.0)
Hemoglobin: 9.3 g/dL — ABNORMAL LOW (ref 13.0–17.0)
MCH: 29.4 pg (ref 26.0–34.0)
MCHC: 32.9 g/dL (ref 30.0–36.0)
MCV: 89.6 fL (ref 80.0–100.0)
Platelets: 128 10*3/uL — ABNORMAL LOW (ref 150–400)
RBC: 3.16 MIL/uL — ABNORMAL LOW (ref 4.22–5.81)
RDW: 14.7 % (ref 11.5–15.5)
WBC: 5.8 10*3/uL (ref 4.0–10.5)
nRBC: 0 % (ref 0.0–0.2)

## 2019-09-17 LAB — GLUCOSE, CAPILLARY
Glucose-Capillary: 121 mg/dL — ABNORMAL HIGH (ref 70–99)
Glucose-Capillary: 118 mg/dL — ABNORMAL HIGH (ref 70–99)
Glucose-Capillary: 120 mg/dL — ABNORMAL HIGH (ref 70–99)
Glucose-Capillary: 107 mg/dL — ABNORMAL HIGH (ref 70–99)

## 2019-09-17 LAB — BASIC METABOLIC PANEL
Anion gap: 9 (ref 5–15)
BUN: 15 mg/dL (ref 8–23)
CO2: 24 mmol/L (ref 22–32)
Calcium: 8.4 mg/dL — ABNORMAL LOW (ref 8.9–10.3)
Chloride: 106 mmol/L (ref 98–111)
Creatinine, Ser: 0.82 mg/dL (ref 0.61–1.24)
GFR calc Af Amer: >60 mL/min (ref >60)
GFR calc non Af Amer: >60 mL/min (ref >60)
Glucose, Bld: 127 mg/dL — ABNORMAL HIGH (ref 70–99)
Potassium: 4.6 mmol/L (ref 3.5–5.1)
Sodium: 139 mmol/L (ref 135–145)

## 2019-09-17 LAB — PHOSPHORUS: Phosphorus: 2.3 mg/dL — ABNORMAL LOW (ref 2.5–4.6)

## 2019-09-17 LAB — MAGNESIUM: Magnesium: 1.8 mg/dL (ref 1.7–2.4)

## 2019-09-17 MED ORDER — METHOCARBAMOL 500 MG PO TABS
1000.0000 mg | ORAL_TABLET | Freq: Three times a day (TID) | ORAL | Status: DC
  Administered 2019-09-17 – 2019-09-19 (×6): 1000 mg via ORAL
  Filled 2019-09-17 (×6): qty 2

## 2019-09-17 MED ORDER — ENOXAPARIN SODIUM 30 MG/0.3ML ~~LOC~~ SOLN
30.0000 mg | Freq: Two times a day (BID) | SUBCUTANEOUS | Status: DC
  Administered 2019-09-17 – 2019-09-19 (×5): 30 mg via SUBCUTANEOUS
  Filled 2019-09-17 (×5): qty 0.3

## 2019-09-17 NOTE — Progress Notes (Signed)
Trauma Critical Care Follow Up Note  Subjective:    Overnight Issues: NAEON. Pt endorses improved pain control with abd binder in place.  Objective:  Vital signs for last 24 hours: Temp:  [97.8 F (36.6 C)-99.5 F (37.5 C)] 98.2 F (36.8 C) (11/16 0800) Pulse Rate:  [68-110] 81 (11/16 1000) Resp:  [14-28] 14 (11/16 1000) BP: (100-141)/(56-86) 107/68 (11/16 1000) SpO2:  [94 %-99 %] 97 % (11/16 1000)  Hemodynamic parameters for last 24 hours:    Intake/Output from previous day: 11/15 0701 - 11/16 0700 In: 870 [P.O.:870] Out: 1300 [Urine:850; Chest Tube:450]  Intake/Output this shift: Total I/O In: 240 [P.O.:240] Out: 200 [Urine:200]  Vent settings for last 24 hours:    Physical Exam:  Gen: comfortable, no distress Neuro: non-focal exam HEENT: PERRL Neck: supple CV: RRR Pulm: unlabored breathing, L CT on sxn -20, no AL, persistent PTX but improved Abd: soft, NT GU: clear yellow urine in urinal Extr: wwp, no edema   Results for orders placed or performed during the hospital encounter of 09/14/19 (from the past 24 hour(s))  Glucose, capillary     Status: Abnormal   Collection Time: 09/16/19 11:21 AM  Result Value Ref Range   Glucose-Capillary 124 (H) 70 - 99 mg/dL   Comment 1 Notify RN    Comment 2 Document in Chart   Glucose, capillary     Status: None   Collection Time: 09/16/19  4:36 PM  Result Value Ref Range   Glucose-Capillary 95 70 - 99 mg/dL   Comment 1 Notify RN    Comment 2 Document in Chart   Glucose, capillary     Status: Abnormal   Collection Time: 09/16/19  9:10 PM  Result Value Ref Range   Glucose-Capillary 110 (H) 70 - 99 mg/dL   Comment 1 Notify RN    Comment 2 Document in Chart   CBC     Status: Abnormal   Collection Time: 09/16/19 11:01 PM  Result Value Ref Range   WBC 6.0 4.0 - 10.5 K/uL   RBC 3.17 (L) 4.22 - 5.81 MIL/uL   Hemoglobin 9.5 (L) 13.0 - 17.0 g/dL   HCT 28.2 (L) 39.0 - 52.0 %   MCV 89.0 80.0 - 100.0 fL   MCH 30.0  26.0 - 34.0 pg   MCHC 33.7 30.0 - 36.0 g/dL   RDW 14.8 11.5 - 15.5 %   Platelets 121 (L) 150 - 400 K/uL   nRBC 0.0 0.0 - 0.2 %  Basic metabolic panel     Status: Abnormal   Collection Time: 09/17/19 12:01 AM  Result Value Ref Range   Sodium 139 135 - 145 mmol/L   Potassium 4.6 3.5 - 5.1 mmol/L   Chloride 106 98 - 111 mmol/L   CO2 24 22 - 32 mmol/L   Glucose, Bld 127 (H) 70 - 99 mg/dL   BUN 15 8 - 23 mg/dL   Creatinine, Ser 0.82 0.61 - 1.24 mg/dL   Calcium 8.4 (L) 8.9 - 10.3 mg/dL   GFR calc non Af Amer >60 >60 mL/min   GFR calc Af Amer >60 >60 mL/min   Anion gap 9 5 - 15  Magnesium     Status: None   Collection Time: 09/17/19 12:01 AM  Result Value Ref Range   Magnesium 1.8 1.7 - 2.4 mg/dL  Phosphorus     Status: Abnormal   Collection Time: 09/17/19 12:01 AM  Result Value Ref Range   Phosphorus 2.3 (L) 2.5 -  4.6 mg/dL  CBC     Status: Abnormal   Collection Time: 09/17/19 12:01 AM  Result Value Ref Range   WBC 5.8 4.0 - 10.5 K/uL   RBC 3.16 (L) 4.22 - 5.81 MIL/uL   Hemoglobin 9.3 (L) 13.0 - 17.0 g/dL   HCT 28.3 (L) 39.0 - 52.0 %   MCV 89.6 80.0 - 100.0 fL   MCH 29.4 26.0 - 34.0 pg   MCHC 32.9 30.0 - 36.0 g/dL   RDW 14.7 11.5 - 15.5 %   Platelets 128 (L) 150 - 400 K/uL   nRBC 0.0 0.0 - 0.2 %  Glucose, capillary     Status: Abnormal   Collection Time: 09/17/19  8:10 AM  Result Value Ref Range   Glucose-Capillary 121 (H) 70 - 99 mg/dL   Comment 1 Notify RN    Comment 2 Document in Chart     Assessment & Plan: Present on Admission: **None**    LOS: 3 days   Additional comments:I reviewed the patient's new clinical lab test results.   and I reviewed the patients new imaging test results.    15M s/p fall  Left 3-9 rib fx, pulm contusion,anterior ptx - pulm toilet, monitor sats, pain control,L CT to remain, increase to 40 sxn. Monitor closely as sentinel eye is at the chest wall.  Large left flank hematoma- Abd binder in place, hgb stable. No operative  intervention indicated L3 and L4 tp fx- pain control FEN - replete hypomag, reg diet DVT - SCDs, start LMWH Dispo - floor, PT/OT ordered    Jesusita Oka, MD Trauma & General Surgery Please use [email or web address] to contact on call provider  09/17/2019  *Care during the described time interval was provided by me. I have reviewed this patient's available data, including medical history, events of note, physical examination and test results as part of my evaluation.

## 2019-09-17 NOTE — TOC Initial Note (Signed)
Transition of Care [facility]) - Initial/Assessment Note    Patient Details  Name: Randy Myers MRN: [id number] Date of Birth: 1946-07-19  Transition of Care [facility]) CM/SW Contact:    Ella Bodo, RN Phone Number: 09/17/2019, 4:19 PM  Clinical Narrative: Pt admitted on 09/14/2019 s/p fall from ladder with multiple rib fx, large Lt flank hematoma, and L3/L4 TVP fx,  PTA, pt independent, lives at home with spouse, who can provide 24h care at dc.  Will follow for dc needs as pt progresses.    SBIRT completed; pt denies ETOH use or need for SA resources.                    Expected Discharge Plan: [place] Barriers to Discharge: Continued Medical Work up          Expected Discharge Plan and Services Expected Discharge Plan: [place]   Discharge Planning Services: CM Consult   Living arrangements for the past 2 months: Single Family Home                                      Prior Living Arrangements/Services Living arrangements for the past 2 months: Single Family Home Lives with:: Spouse Patient language and need for interpreter reviewed:: Yes Do you feel safe going back to the place where you live?: Yes      Need for Family Participation in Patient Care: Yes (Comment) Care giver support system in place?: Yes (comment)   Criminal Activity/Legal Involvement Pertinent to Current Situation/Hospitalization: No - Comment as needed  Activities of Daily Living Home Assistive Devices/Equipment: None ADL Screening (condition at time of admission) Patient's cognitive ability adequate to safely complete daily activities?: Yes Is the patient deaf or have difficulty hearing?: No Does the patient have difficulty seeing, even when wearing glasses/contacts?: No Does the patient have difficulty concentrating, remembering, or making decisions?: No Patient able to express need for assistance with ADLs?: Yes Does the patient have difficulty  dressing or bathing?: No Independently performs ADLs?: Yes (appropriate for developmental age) Does the patient have difficulty walking or climbing stairs?: No Weakness of Legs: None Weakness of Arms/Hands: None  Permission Sought/Granted                  Emotional Assessment Appearance:: Appears stated age Attitude/Demeanor/Rapport: Engaged Affect (typically observed): Accepting Orientation: : Oriented to Self, Oriented to Place, Oriented to  Time, Oriented to Situation Alcohol / Substance Use: Not Applicable Psych Involvement: No (comment)  Admission diagnosis:  Fall from ladder [W11.XXXA] Fall [W19.XXXA] Renal insufficiency [N28.9] Traumatic pneumothorax, initial encounter [S27.0XXA] Fall, initial encounter [id number]] Closed fracture of multiple ribs of left side, initial encounter [S22.42XA] Hematoma of left flank, initial encounter [S30.1XXA] Patient Active Problem List   Diagnosis Date Noted  . Fall from ladder 09/14/2019   PCP:  System, Pcp Not In Pharmacy:   [place], [place] - [place] [address] [place] [place] [address] Phone: [phone] Fax: [facility] [place], [place] [address] Phone: [phone] Fax: [phone]     Social Determinants of Health (SDOH) Interventions    Readmission Risk Interventions No flowsheet data found.   Reinaldo Raddle, RN, BSN  Trauma/Neuro ICU Case Manager [phone]

## 2019-09-17 NOTE — Progress Notes (Signed)
Attempted to call patient's wife, [name] [phone]), to notify her of patient's transfer to [facility]. No answer and unable to leave a voicemail.   Candy Sledge, RN

## 2019-09-18 ENCOUNTER — Encounter (HOSPITAL_COMMUNITY): Payer: Self-pay | Admitting: General Practice

## 2019-09-18 ENCOUNTER — Inpatient Hospital Stay (HOSPITAL_COMMUNITY): Payer: Medicare HMO

## 2019-09-18 LAB — TYPE AND SCREEN
ABO/RH(D): A POS
Antibody Screen: NEGATIVE
Unit division: 0
Unit division: 0
Unit division: 0
Unit division: 0

## 2019-09-18 LAB — GLUCOSE, CAPILLARY
Glucose-Capillary: 154 mg/dL — ABNORMAL HIGH (ref 70–99)
Glucose-Capillary: 116 mg/dL — ABNORMAL HIGH (ref 70–99)
Glucose-Capillary: 118 mg/dL — ABNORMAL HIGH (ref 70–99)
Glucose-Capillary: 111 mg/dL — ABNORMAL HIGH (ref 70–99)

## 2019-09-18 LAB — CBC
HCT: 27.8 % — ABNORMAL LOW (ref 39.0–52.0)
Hemoglobin: 9.2 g/dL — ABNORMAL LOW (ref 13.0–17.0)
MCH: 29.5 pg (ref 26.0–34.0)
MCHC: 33.1 g/dL (ref 30.0–36.0)
MCV: 89.1 fL (ref 80.0–100.0)
Platelets: 143 10*3/uL — ABNORMAL LOW (ref 150–400)
RBC: 3.12 MIL/uL — ABNORMAL LOW (ref 4.22–5.81)
RDW: 14.7 % (ref 11.5–15.5)
WBC: 5.9 10*3/uL (ref 4.0–10.5)
nRBC: 0 % (ref 0.0–0.2)

## 2019-09-18 LAB — BPAM RBC
Blood Product Expiration Date: 202012102359
Blood Product Expiration Date: 202012102359
Blood Product Expiration Date: 202012132359
Blood Product Expiration Date: 202012132359
ISSUE DATE / TIME: 202011121009
ISSUE DATE / TIME: 202011121009
ISSUE DATE / TIME: 202011132101
ISSUE DATE / TIME: 202011132113
Unit Type and Rh: 5100
Unit Type and Rh: 5100
Unit Type and Rh: 6200
Unit Type and Rh: 6200

## 2019-09-18 LAB — MAGNESIUM: Magnesium: 1.7 mg/dL (ref 1.7–2.4)

## 2019-09-18 LAB — BASIC METABOLIC PANEL
Anion gap: 8 (ref 5–15)
BUN: 12 mg/dL (ref 8–23)
CO2: 27 mmol/L (ref 22–32)
Calcium: 8.5 mg/dL — ABNORMAL LOW (ref 8.9–10.3)
Chloride: 105 mmol/L (ref 98–111)
Creatinine, Ser: 0.94 mg/dL (ref 0.61–1.24)
GFR calc Af Amer: >60 mL/min (ref >60)
GFR calc non Af Amer: >60 mL/min (ref >60)
Glucose, Bld: 121 mg/dL — ABNORMAL HIGH (ref 70–99)
Potassium: 4.6 mmol/L (ref 3.5–5.1)
Sodium: 140 mmol/L (ref 135–145)

## 2019-09-18 LAB — PHOSPHORUS: Phosphorus: 3.4 mg/dL (ref 2.5–4.6)

## 2019-09-18 NOTE — Progress Notes (Signed)
Telemetry called that pt had 8 secs run of SVT, this RN came to check pt, pt asymptomatic and no chest pain, MD notified and no orders received, will continue to monitor.

## 2019-09-18 NOTE — Progress Notes (Signed)
Subjective: CC: Patient reports pain is well controlled. No SOB. Tolerating diet without n/v.   Objective: Vital signs in last 24 hours: Temp:  [97.6 F (36.4 C)-98.7 F (37.1 C)] 97.6 F (36.4 C) (11/17 1109) Pulse Rate:  [81-103] 89 (11/17 1109) Resp:  [15-26] 18 (11/17 1109) BP: (101-137)/(64-81) 101/68 (11/17 1109) SpO2:  [91 %-99 %] 99 % (11/17 1109) Last BM Date: 09/14/19  Intake/Output from previous day: 11/16 0701 - 11/17 0700 In: 720 [P.O.:720] Out: 1360 [Urine:1150; Chest Tube:210] Intake/Output this shift: Total I/O In: 320 [P.O.:320] Out: -   PE: Gen:  Alert, NAD, pleasant Card:  RRR, no M/G/R heard Pulm:  CTAB, no W/R/R, effort normal. Pulling 1500 on IS. CT removed.  Abd: Soft, NT/ND, +BS Ext:  No erythema, edema, or tenderness BUE/BLE  Psych: A&Ox3  Skin: no rashes noted, warm and dry   Lab Results:  Recent Labs    09/17/19 0001 09/18/19 0213  WBC 5.8 5.9  HGB 9.3* 9.2*  HCT 28.3* 27.8*  PLT 128* 143*   BMET Recent Labs    09/17/19 0001 09/18/19 0213  NA 139 140  K 4.6 4.6  CL 106 105  CO2 24 27  GLUCOSE 127* 121*  BUN 15 12  CREATININE 0.82 0.94  CALCIUM 8.4* 8.5*   PT/INR No results for input(s): LABPROT, INR in the last 72 hours. CMP     Component Value Date/Time   NA 140 09/18/2019 0213   K 4.6 09/18/2019 0213   CL 105 09/18/2019 0213   CO2 27 09/18/2019 0213   GLUCOSE 121 (H) 09/18/2019 0213   BUN 12 09/18/2019 0213   CREATININE 0.94 09/18/2019 0213   CALCIUM 8.5 (L) 09/18/2019 0213   PROT 5.6 (L) 09/14/2019 2053   ALBUMIN 3.3 (L) 09/14/2019 2053   AST 39 09/14/2019 2053   ALT 16 09/14/2019 2053   ALKPHOS 47 09/14/2019 2053   BILITOT 0.7 09/14/2019 2053   GFRNONAA >60 09/18/2019 0213   GFRAA >60 09/18/2019 0213   Lipase  No results found for: LIPASE     Studies/Results: Dg Chest Port 1 View  Result Date: 09/18/2019 CLINICAL DATA:  73 year old male with fall. Left-sided chest tube. EXAM: PORTABLE  CHEST 1 VIEW COMPARISON:  Chest radiograph dated 09/17/2019. FINDINGS: The left-sided chest tube has been pulled back with tip and side-port external to the thoracic cage overlying the soft tissues of the left lateral chest wall. Small left pneumothorax relatively similar to prior radiograph measuring approximately 11 mm to the left apical pleural surface. There is shallow inspiration with bibasilar atelectasis. No focal consolidation. Stable cardiac silhouette. Atherosclerotic calcification of the aorta. Multiple mildly displaced left rib fractures. IMPRESSION: 1. Left-sided chest tube has been pulled back with tip and side-port overlying the soft tissues of the left lateral chest wall. 2. Stable small left pneumothorax. Electronically Signed   By: Anner Crete M.D.   On: 09/18/2019 12:14   Cxr  Result Date: 09/17/2019 CLINICAL DATA:  Left chest tube EXAM: PORTABLE CHEST 1 VIEW COMPARISON:  Yesterday FINDINGS: Interval retraction of left chest tube with side port at the level of the bony rib cage. Left pneumothorax is small and decreased. Multiple left rib fractures. Low volume chest with atelectasis. Stable heart size and mediastinal contours. These results will be called to the ordering clinician or representative by the Radiologist Assistant, and communication documented in the PACS or zVision Dashboard. IMPRESSION: 1. Retracted left chest tube with side port at  the level of the bony rib cage. 2. Small and decreased left pneumothorax. 3. Low volume chest with atelectasis. Electronically Signed   By: Monte Fantasia M.D.   On: 09/17/2019 05:50    Anti-infectives: Anti-infectives (From admission, onward)   None       Assessment/Plan 17M s/p fall Left 3-9 rib fx, pulm contusion,anterior ptx - CT dislodged on CXR. Lung appears stable without significant drop. CT removed. Continue pulm toilet, monitor sats, pain control. CXR in AM.  Large left flank hematoma- Abd binder in place, hgb stable. No  operative intervention indicated L3 and L4 tp fx- pain control FEN - Reg DVT - SCDs, Lovenox  Dispo -  PT/OT recommended HH. Likely d/c tomorrow if CXR stable.    LOS: 4 days    Jillyn Ledger , [facility] Surgery 09/18/2019, 12:41 PM Please see Amion for pager number during day hours 7:00am-4:30pm

## 2019-09-18 NOTE — TOC Progression Note (Addendum)
Transition of Care [facility]) - Progression Note    Patient Details  Name: Randy Myers MRN: [id number] Date of Birth: [birth year]  Transition of Care [facility]) CM/SW Contact  Oren Section Cleta Alberts, RN Phone Number: 09/18/2019, 4:58 PM  Clinical Narrative:   PT recommending [place] follow up; OT recommending tub/shower seat for home.  Met with pt to discuss home arrangements; pt politely declines HH follow up, stating that he has two granddaughters that are "in the PT business."  He declines tub shower seat as well.  Pt still on oxygen; plan to wean off prior to dc.      Expected Discharge Plan: [place] Barriers to Discharge: Continued Medical Work up  Expected Discharge Plan and Services   Discharge Planning Services: CM Consult   Living arrangements for the past 2 months: Single Family Home                  Expected discharge plan:  Home/Self Care                     Social Determinants of Health (SDOH) Interventions    Readmission Risk Interventions No flowsheet data found.  Reinaldo Raddle, RN, BSN  Trauma/Neuro ICU Case Manager [phone]

## 2019-09-18 NOTE — Evaluation (Signed)
Occupational Therapy Evaluation Patient Details Name: Randy Myers MRN: [id number] DOB: [birth year] Today's Date: 09/18/2019    History of Present Illness 73 y.o male presents from home s/p fall from ladder with multiple rib fx, large Lt flank hematoma, and L3/L4 TVP fx, s/p chest tube placement. Significant PMH includes CAD and prostate cancer.   Clinical Impression   Pt completes selfcare tasks with min assist overall and demonstrates limited endurance.  In addition, he presents with RUE shoulder decreased AROM indicative of possible rotator cuff injury which will likely need to be addressed with further MD evaluation and testing.  Feel he will benefit from acute OT in order to increase overall independence with self care tasks and functional transfers in preparation for discharge home with his spouse.  Do not anticipate follow-up OT needs with relation to selfcare tasks, but he may need post acute OT via outpatient once his shoulder is worked up and evaluated further.      Follow Up Recommendations  No OT follow up;Other (comment)(May need follow-up after evaluation of right shoulder dysfunction from MD but could likley be outpatient)    Equipment Recommendations  Tub/shower seat       Precautions / Restrictions Precautions Precautions: Fall;Other (comment) Precaution Comments: chest tube Restrictions Weight Bearing Restrictions: No      Mobility Bed Mobility Overal bed mobility: Needs Assistance Bed Mobility: Supine to Sit     Supine to sit: Min guard     General bed mobility comments: min guard for bed mobility, cues for techniques  Transfers Overall transfer level: Needs assistance Equipment used: Rolling walker (2 wheeled) Transfers: Sit to/from Stand Sit to Stand: Min assist         General transfer comment: min assist for sit<>stand using the RW, cues for techniques and hand placement    Balance Overall balance assessment: Needs assistance Sitting-balance  support: Feet supported;No upper extremity supported Sitting balance-Leahy Scale: Fair Sitting balance - Comments: sitting balance EOB with supervision while reaching to feet to don socks   Standing balance support: Bilateral upper extremity supported Standing balance-Leahy Scale: Poor Standing balance comment: reliant on UE support for balance                           ADL either performed or assessed with clinical judgement   ADL Overall ADL's : Needs assistance/impaired Eating/Feeding: Independent   Grooming: Wash/dry face;Min guard   Upper Body Bathing: Minimal assistance;Sitting   Lower Body Bathing: Minimal assistance;Sit to/from stand   Upper Body Dressing : Minimal assistance;Sitting   Lower Body Dressing: Minimal assistance   Toilet Transfer: Min guard;BSC;Comfort height toilet Toilet Transfer Details (indicate cue type and reason): standing to urinate with the grab bar on the left side Toileting- Clothing Manipulation and Hygiene: Minimal assistance         General ADL Comments: Pt able to use the RW for support with min guard assist.  He reports not using any type of assistive device PTA.     Vision Baseline Vision/History: No visual deficits Patient Visual Report: No change from baseline Vision Assessment?: No apparent visual deficits            Pertinent Vitals/Pain Pain Score: 5  Pain Location: left side Pain Descriptors / Indicators: Aching;Discomfort;Guarding Pain Intervention(s): Limited activity within patient's tolerance;Monitored during session     Hand Dominance Left   Extremity/Trunk Assessment Upper Extremity Assessment Upper Extremity Assessment: RUE deficits/detail RUE Deficits / Details: Pt  with AROM shoulder flexion less than 30 degrees, He completes AAROM [facility] demonstrating possible rotator cuff injury.  All other joints at the elbow and hand strength WFLs RUE Sensation: WNL RUE Coordination: WNL   Lower Extremity  Assessment Lower Extremity Assessment: Defer to PT evaluation RLE Sensation: (impaired hot/cold sensation secondary to previous CVA)   Cervical / Trunk Assessment Cervical / Trunk Assessment: Kyphotic   Communication Communication Communication: No difficulties   Cognition Arousal/Alertness: Awake/alert Behavior During Therapy: WFL for tasks assessed/performed Overall Cognitive Status: Within Functional Limits for tasks assessed                                     General Comments  L chest tube, pt initially on 1L O2/min at the start of session, able to tolerate RA with activity ambulating in the room SpO2 90-93% and HR 98-116 bpm            Home Living Family/patient expects to be discharged to:: Private residence Living Arrangements: Spouse/significant other Available Help at Discharge: Available 24 hours/day Type of Home: House Home Access: Stairs to enter [employer] of Steps: 3 Entrance Stairs-Rails: Right;Left Home Layout: One level     Bathroom Shower/Tub: Teacher, early years/pre: Handicapped height Bathroom Accessibility: Yes   Home Equipment: Environmental consultant - 2 wheels;Grab bars - tub/shower          Prior Functioning/Environment Level of Independence: Independent                 OT Problem List: Decreased strength;Impaired balance (sitting and/or standing);Pain;Decreased range of motion;Impaired UE functional use;Decreased activity tolerance;Decreased knowledge of use of DME or AE      OT Treatment/Interventions: Self-care/ADL training;DME and/or AE instruction;Therapeutic activities;Balance training;Patient/family education;Therapeutic exercise;Manual therapy    OT Goals(Current goals can be found in the care plan section) Acute Rehab OT Goals Patient Stated Goal: Pt wants to go home OT Goal Formulation: With patient Time For Goal Achievement: 10/02/19 Potential to Achieve Goals: Good  OT Frequency: Min 2X/week            Co-evaluation PT/OT/SLP Co-Evaluation/Treatment: Yes Reason for Co-Treatment: For patient/therapist safety;To address functional/ADL transfers PT goals addressed during session: Mobility/safety with mobility;Balance;Proper use of DME;Strengthening/ROM OT goals addressed during session: ADL's and self-care      AM-PAC OT "6 Clicks" Daily Activity     Outcome Measure Help from another person eating meals?: None Help from another person taking care of personal grooming?: A Little Help from another person toileting, which includes using toliet, bedpan, or urinal?: A Little Help from another person bathing (including washing, rinsing, drying)?: A Little Help from another person to put on and taking off regular upper body clothing?: A Little Help from another person to put on and taking off regular lower body clothing?: A Little 6 Click Score: 19   End of Session Equipment Utilized During Treatment: Rolling walker Nurse Communication: Mobility status  Activity Tolerance: Patient tolerated treatment well Patient left: in chair;with call bell/phone within reach;with chair alarm set  OT Visit Diagnosis: Muscle weakness (generalized) (M62.81)                Time: [id number] OT Time Calculation (min): 35 min Charges:  OT General Charges $OT Visit: 1 Visit OT Evaluation $OT Eval Moderate Complexity: 1 Mod  Dory Verdun OTR/L 09/18/2019, 1:18 PM

## 2019-09-18 NOTE — Evaluation (Signed)
Physical Therapy Evaluation Patient Details Name: Randy Myers MRN: [id number] DOB: 19-Dec-1945 Today's Date: 09/18/2019   History of Present Illness  73 y.o male presents from home s/p fall from ladder with multiple rib fx, large Lt flank hematoma, and L3/L4 TVP fx, s/p chest tube placement. Significant PMH includes CAD and prostate cancer.  Clinical Impression  Pt presents with an overall decrease in functional mobility secondary to above. PTA, pt was independent with all mobility without an AD, active and performs yardwork. Educ on precautions, positioning, therex, and importance of mobility. Today, pt able to perform bed mobility min guard assist, ambulate within the room with RW and min assist, therapist also assisting with line management. Pt with increased work of breathing during activity, maintains SpO2 >90% while on RA. Pt would benefit from continued acute PT services to maximize functional mobility and independence prior to d/c home with homeheatlh.     Follow Up Recommendations Home health PT    Equipment Recommendations  None recommended by PT    Recommendations for Other Services       Precautions / Restrictions Precautions Precautions: Fall;Other (comment) Precaution Comments: chest tube Restrictions Weight Bearing Restrictions: No      Mobility  Bed Mobility Overal bed mobility: Needs Assistance Bed Mobility: Supine to Sit     Supine to sit: Min guard     General bed mobility comments: min guard for bed mobility, cues for techniques  Transfers Overall transfer level: Needs assistance Equipment used: Rolling walker (2 wheeled) Transfers: Sit to/from Stand Sit to Stand: Min assist         General transfer comment: min assist for sit<>stand using the RW, cues for techniques and hand placement  Ambulation/Gait Ambulation/Gait assistance: Min assist Gait Distance (Feet): 10 Feet Assistive device: Rolling walker (2 wheeled) Gait Pattern/deviations:  Decreased step length - right;Decreased step length - left Gait velocity: decreased Gait velocity interpretation: <1.31 ft/sec, indicative of household ambulator General Gait Details: min assist to steady during gait, ambulated from bed>bathroom>sink>recliner with cues for assistive device management  Stairs            Wheelchair Mobility    Modified Rankin (Stroke Patients Only)       Balance Overall balance assessment: Needs assistance Sitting-balance support: Feet supported;No upper extremity supported Sitting balance-Leahy Scale: Fair Sitting balance - Comments: sitting balance EOB with supervision while reaching to feet to don socks   Standing balance support: Bilateral upper extremity supported Standing balance-Leahy Scale: Poor Standing balance comment: reliant on UE support for balance                             Pertinent Vitals/Pain Pain Assessment: 0-10 Pain Score: 5  Pain Location: left side Pain Descriptors / Indicators: Aching;Discomfort;Guarding Pain Intervention(s): Limited activity within patient's tolerance;Monitored during session    Home Living Family/patient expects to be discharged to:: Private residence Living Arrangements: Spouse/significant other Available Help at Discharge: Available 24 hours/day Type of Home: House Home Access: Stairs to enter Entrance Stairs-Rails: Psychiatric nurse of Steps: 3 Home Layout: One level Home Equipment: Environmental consultant - 2 wheels;Grab bars - tub/shower      Prior Function Level of Independence: Independent               Hand Dominance   Dominant Hand: Left    Extremity/Trunk Assessment   Upper Extremity Assessment Upper Extremity Assessment: Defer to OT evaluation    Lower Extremity Assessment  Lower Extremity Assessment: Generalized weakness;Overall WFL for tasks assessed;RLE deficits/detail RLE Sensation: (impaired hot/cold sensation secondary to previous CVA)     Cervical / Trunk Assessment Cervical / Trunk Assessment: Kyphotic  Communication   Communication: No difficulties  Cognition Arousal/Alertness: Awake/alert Behavior During Therapy: WFL for tasks assessed/performed Overall Cognitive Status: Within Functional Limits for tasks assessed                                        General Comments General comments (skin integrity, edema, etc.): L chest tube, pt initially on 1L O2/min at the start of session, able to tolerate RA with activity ambulating in the room SpO2 90-93% and HR 98-116 bpm    Exercises     Assessment/Plan    PT Assessment Patient needs continued PT services  PT Problem List Decreased strength;Decreased mobility;Decreased safety awareness;Decreased coordination;Decreased activity tolerance;Decreased balance;Pain;Decreased knowledge of use of DME       PT Treatment Interventions DME instruction;Therapeutic activities;Gait training;Therapeutic exercise;Patient/family education;Stair training;Balance training;Functional mobility training;Neuromuscular re-education    PT Goals (Current goals can be found in the Care Plan section)  Acute Rehab PT Goals Patient Stated Goal: "go home" PT Goal Formulation: With patient Time For Goal Achievement: 10/02/19 Potential to Achieve Goals: Good    Frequency Min 3X/week   Barriers to discharge        Co-evaluation PT/OT/SLP Co-Evaluation/Treatment: Yes Reason for Co-Treatment: For patient/therapist safety;To address functional/ADL transfers PT goals addressed during session: Mobility/safety with mobility;Balance;Proper use of DME;Strengthening/ROM         AM-PAC PT "6 Clicks" Mobility  Outcome Measure Help needed turning from your back to your side while in a flat bed without using bedrails?: None Help needed moving from lying on your back to sitting on the side of a flat bed without using bedrails?: A Little Help needed moving to and from a bed to a  chair (including a wheelchair)?: A Little Help needed standing up from a chair using your arms (e.g., wheelchair or bedside chair)?: A Little Help needed to walk in hospital room?: A Little Help needed climbing 3-5 steps with a railing? : A Little 6 Click Score: 19    End of Session Equipment Utilized During Treatment: Gait belt Activity Tolerance: Patient tolerated treatment well;No increased pain Patient left: in chair;with chair alarm set;with call bell/phone within reach Nurse Communication: Mobility status PT Visit Diagnosis: Unsteadiness on feet (R26.81);Muscle weakness (generalized) (M62.81)    Time: [id number] PT Time Calculation (min) (ACUTE ONLY): 29 min   Charges:   PT Evaluation $PT Eval Moderate Complexity: 1 Mod          Netta Corrigan, PT, DPT, CSRS Acute Rehab Office [place] 09/18/2019, 11:40 AM

## 2019-09-19 ENCOUNTER — Inpatient Hospital Stay (HOSPITAL_COMMUNITY): Payer: Medicare HMO

## 2019-09-19 LAB — BASIC METABOLIC PANEL
Anion gap: 10 (ref 5–15)
BUN: 14 mg/dL (ref 8–23)
CO2: 26 mmol/L (ref 22–32)
Calcium: 8.5 mg/dL — ABNORMAL LOW (ref 8.9–10.3)
Chloride: 105 mmol/L (ref 98–111)
Creatinine, Ser: 0.9 mg/dL (ref 0.61–1.24)
GFR calc Af Amer: >60 mL/min (ref >60)
GFR calc non Af Amer: >60 mL/min (ref >60)
Glucose, Bld: 126 mg/dL — ABNORMAL HIGH (ref 70–99)
Potassium: 3.7 mmol/L (ref 3.5–5.1)
Sodium: 141 mmol/L (ref 135–145)

## 2019-09-19 LAB — MAGNESIUM: Magnesium: 1.8 mg/dL (ref 1.7–2.4)

## 2019-09-19 LAB — CBC
HCT: 29.3 % — ABNORMAL LOW (ref 39.0–52.0)
Hemoglobin: 9.5 g/dL — ABNORMAL LOW (ref 13.0–17.0)
MCH: 29.3 pg (ref 26.0–34.0)
MCHC: 32.4 g/dL (ref 30.0–36.0)
MCV: 90.4 fL (ref 80.0–100.0)
Platelets: 181 10*3/uL (ref 150–400)
RBC: 3.24 MIL/uL — ABNORMAL LOW (ref 4.22–5.81)
RDW: 14.8 % (ref 11.5–15.5)
WBC: 5.9 10*3/uL (ref 4.0–10.5)
nRBC: 0 % (ref 0.0–0.2)

## 2019-09-19 LAB — GLUCOSE, CAPILLARY
Glucose-Capillary: 135 mg/dL — ABNORMAL HIGH (ref 70–99)
Glucose-Capillary: 134 mg/dL — ABNORMAL HIGH (ref 70–99)

## 2019-09-19 LAB — PHOSPHORUS: Phosphorus: 3.8 mg/dL (ref 2.5–4.6)

## 2019-09-19 MED ORDER — GUAIFENESIN ER 600 MG PO TB12
600.0000 mg | ORAL_TABLET | Freq: Two times a day (BID) | ORAL | Status: DC
  Administered 2019-09-19: 14:00:00 600 mg via ORAL
  Filled 2019-09-19: qty 1

## 2019-09-19 MED ORDER — OXYCODONE HCL 5 MG PO TABS: 5.0000 mg | ORAL_TABLET | Freq: Four times a day (QID) | ORAL | 0 refills | Status: DC | PRN

## 2019-09-19 MED ORDER — POLYETHYLENE GLYCOL 3350 17 G PO PACK
17.0000 g | PACK | Freq: Every day | ORAL | Status: DC
  Administered 2019-09-19: 17 g via ORAL
  Filled 2019-09-19: qty 1

## 2019-09-19 NOTE — Care Management Important Message (Signed)
Important Message  Patient Details  Name: Randy Myers MRN: [id number] Date of Birth: October 05, 1946   Medicare Important Message Given:  Yes     Shelda Altes 09/19/2019, 3:30 PM

## 2019-09-19 NOTE — Discharge Summary (Signed)
Patient ID: Randy Myers [id number] 09-12-1946 73 y.o.  Admit date: 09/14/2019 Discharge date: 09/19/2019  Admitting Diagnosis: Fall Left 3-9 rib fracture  Left Pulmonary contusion Anterior pneumothorax Large left flank hematoma L3 and L4 transverse process fracture  Discharge Diagnosis Patient Active Problem List   Diagnosis Date Noted  . Fall from ladder 09/14/2019  Left 3-9 rib fx, pulm contusion,anterior ptx  Large left flank hematoma L3 and L4 tp fx Right shoulder pain  Consultants None  Reason for Admission: 73 yom s/p fall about four hours ago from ladder while cutting tree branches.  He progressively had more pain and called 911.  He has left sided chest pain and flank pain. Hurts with deep breaths nothing making it better.   He does take plavix for cad.  He remembers whole event.  He arrives with bp in the 70s, awake and alert  Procedures Dr. Donne Hazel - Chest tube placement - 09/16/2019  Hospital Course:  Randy Myers presented for above reasons and was found to have left 3-9 rib fracture, left pulmonary contusion, left anterior pneumothorax, a large left flank hematoma and L3- L4 transverse process fracture.  He was admitted to the trauma service.  Chest tube was not indicated at time of admission.  He did receive 2 units of packed red blood cells continue to FFP in the ER due to low BPM being on Plavix.  His Plavix was held during admission.  On 11/15 chest x-ray revealed visible pneumothorax that required chest tube placement.  He underwent chest tube placement by Dr. Donne Hazel as above.  Serial chest x-rays were monitored and pneumothorax stabilized.  Chest tube was removed on 11/17.  Repeat chest x-ray the following day was stable.  Patient was weaned off oxygen and tolerated without hypoxia.  Patient's hemoglobin stabilized.  Flank hematoma did not require operative intervention.  Patient worked with therapies who recommended home health. On 11/18 the  patient was saturating well on room air without hypoxia, with clear breath sounds bilaterally, voiding well, tolerating diet, ambulating well, pain well controlled, vital signs stable, chest tube site wound clean and dry and felt stable for discharge home. Follow up as noted below.   Physical Exam: Please see progress note from earlier today  Allergies as of 09/19/2019   No Known Allergies     Medication List    TAKE these medications   acetaminophen 650 MG CR tablet Commonly known as: TYLENOL Take 650 mg by mouth every 8 (eight) hours as needed for pain.   atorvastatin 10 MG tablet Commonly known as: LIPITOR Take 10 mg by mouth daily after supper.   clopidogrel 75 MG tablet Commonly known as: PLAVIX Take 75 mg by mouth daily after supper.   famotidine 40 MG tablet Commonly known as: PEPCID Take 40 mg by mouth daily after supper.   lisinopril 5 MG tablet Commonly known as: ZESTRIL Take 5 mg by mouth daily after supper.   multivitamin with minerals Tabs tablet Take 1 tablet by mouth daily with breakfast.   omeprazole 40 MG capsule Commonly known as: PRILOSEC Take 40 mg by mouth daily with breakfast.   oxyCODONE 5 MG immediate release tablet Commonly known as: Oxy IR/ROXICODONE Take 1 tablet (5 mg total) by mouth every 6 (six) hours as needed for breakthrough pain.            Durable Medical Equipment  (From admission, onward)         Start  Ordered   09/18/19 1401  For home use only DME Tub bench  Once     09/18/19 1401           Follow-up Information    Your PCP Follow up.   Why: Please follow up with your pcp. You may benifit from additional imaging of your right shoulder as an outpatient.        [facility] Follow up on 10/04/2019.   Why: 9:20am. Please arrive 30 minutes prior to your appointment for paperwork.   Contact information: [facility] [phone] [facility] up.   Why: Please go on 10/03/2019 between 8-5pm for a chest xray. You do not need to make an appointment. They accept walk ins.  Contact information: [place] [phone]           Signed: Alferd Apa, [facility] 09/19/2019, 2:15 PM Please see Amion for pager number during day hours 7:00am-4:30pm

## 2019-09-19 NOTE — Progress Notes (Signed)
Subjective: CC: Patient reports he had a good night. No real pain. Only took 1 oxy yesterday. No SOB. Still on 1L o2. He is tolerating a diet without any n/v. Last BM 11/13  Objective: Vital signs in last 24 hours: Temp:  [97.6 F (36.4 C)-98.6 F (37 C)] 97.8 F (36.6 C) (11/18 0835) Pulse Rate:  [82-112] 86 (11/18 0835) Resp:  [18-20] 18 (11/18 0835) BP: (101-148)/(68-80) 123/69 (11/18 0835) SpO2:  [92 %-99 %] 99 % (11/18 0835) Last BM Date: 09/14/19  Intake/Output from previous day: 11/17 0701 - 11/18 0700 In: 41 [P.O.:990] Out: 800 [Urine:800] Intake/Output this shift: Total I/O In: 300 [P.O.:300] Out: 150 [Urine:150]  PE: Gen:  Alert, NAD, pleasant Card:  RRR, no M/G/R heard Pulm:  CTAB, no W/R/R, effort normal. On 1L with sats 94+.Pulling 1500 on IS. CT site with dried blood but clean. No signs of active bleeding. Dressing intact.  Abd: Soft, NT/ND, +BS Ext:  No erythema, edema, or tenderness BUE/BLE  Psych: A&Ox3  Skin: no rashes noted, warm and dry  Lab Results:  Recent Labs    09/18/19 0213 09/19/19 0157  WBC 5.9 5.9  HGB 9.2* 9.5*  HCT 27.8* 29.3*  PLT 143* 181   BMET Recent Labs    09/18/19 0213 09/19/19 0157  NA 140 141  K 4.6 3.7  CL 105 105  CO2 27 26  GLUCOSE 121* 126*  BUN 12 14  CREATININE 0.94 0.90  CALCIUM 8.5* 8.5*   PT/INR No results for input(s): LABPROT, INR in the last 72 hours. CMP     Component Value Date/Time   NA 141 09/19/2019 0157   K 3.7 09/19/2019 0157   CL 105 09/19/2019 0157   CO2 26 09/19/2019 0157   GLUCOSE 126 (H) 09/19/2019 0157   BUN 14 09/19/2019 0157   CREATININE 0.90 09/19/2019 0157   CALCIUM 8.5 (L) 09/19/2019 0157   PROT 5.6 (L) 09/14/2019 2053   ALBUMIN 3.3 (L) 09/14/2019 2053   AST 39 09/14/2019 2053   ALT 16 09/14/2019 2053   ALKPHOS 47 09/14/2019 2053   BILITOT 0.7 09/14/2019 2053   GFRNONAA >60 09/19/2019 0157   GFRAA >60 09/19/2019 0157   Lipase  No results found for: LIPASE     Studies/Results: Dg Chest Port 1 View  Result Date: 09/19/2019 CLINICAL DATA:  Evaluate for pneumothorax.  Chest tube removal. EXAM: PORTABLE CHEST 1 VIEW COMPARISON:  09/18/2019 FINDINGS: Small left apical pneumothorax is stable in size. Again noted are displaced left rib fractures. No focal airspace disease or consolidation. Heart size is normal. Stable appearance of the trachea. IMPRESSION: Stable small left apical pneumothorax. Left rib fractures. Electronically Signed   By: Markus Daft M.D.   On: 09/19/2019 08:47   Dg Chest Port 1 View  Result Date: 09/18/2019 CLINICAL DATA:  73 year old male with fall. Left-sided chest tube. EXAM: PORTABLE CHEST 1 VIEW COMPARISON:  Chest radiograph dated 09/17/2019. FINDINGS: The left-sided chest tube has been pulled back with tip and side-port external to the thoracic cage overlying the soft tissues of the left lateral chest wall. Small left pneumothorax relatively similar to prior radiograph measuring approximately 11 mm to the left apical pleural surface. There is shallow inspiration with bibasilar atelectasis. No focal consolidation. Stable cardiac silhouette. Atherosclerotic calcification of the aorta. Multiple mildly displaced left rib fractures. IMPRESSION: 1. Left-sided chest tube has been pulled back with tip and side-port overlying the soft tissues of the left lateral chest  wall. 2. Stable small left pneumothorax. Electronically Signed   By: Anner Crete M.D.   On: 09/18/2019 12:14    Anti-infectives: Anti-infectives (From admission, onward)   None       Assessment/Plan 73M s/p fall Left 3-9 rib fx, pulm contusion,anterior ptx - CT removed 11/17. CXR stable this AM. Wean O2. Continue pulm toilet, monitor sats, pain control. CXR in AM.  Large left flank hematoma- Abdbinder in place, hgb stable. No operative intervention indicated L3 and L4 tp fx- pain control FEN- Reg DVT- SCDs, Lovenox  Dispo -  PT/OT recommended HH. D/c later  today if can wean off o2   LOS: 5 days    [facility] 09/19/2019, 9:26 AM Please see Amion for pager number during day hours 7:00am-4:30pm

## 2019-09-19 NOTE — Discharge Instructions (Signed)
PNEUMOTHORAX OR HEMOTHORAX +/- RIB FRACTURES  HOME INSTRUCTIONS   1. PAIN CONTROL:  1. Pain is best controlled by a usual combination of three different methods TOGETHER:  i. Ice/Heat ii. Over the counter pain medication iii. Prescription pain medication 2. You may experience some swelling and bruising in area of broken ribs. Ice packs or heating pads (30-60 minutes up to 6 times a day) will help. Use ice for the first few days to help decrease swelling and bruising, then switch to heat to help relax tight/sore spots and speed recovery. Some people prefer to use ice alone, heat alone, alternating between ice & heat. Experiment to what works for you. Swelling and bruising can take several weeks to resolve.  i. It is helpful to take an over-the-counter pain medication regularly for the first few weeks. Acetaminophen (Tylenol, etc) 500-650mg  four times a day (every meal & bedtime) 3. A prescription for pain medication (such as oxycodone, hydrocodone, etc) may be given to you upon discharge. Take your pain medication as prescribed.  i. If you are having problems/concerns with the prescription medicine (does not control pain, nausea, vomiting, rash, itching, etc), please call us [phone] to see if we need to switch you to a different pain medicine that will work better for you and/or control your side effect better. ii. If you need a refill on your pain medication, please contact your pharmacy. They will contact our office to request authorization. Prescriptions will not be filled after 5 pm or on week-ends. 1. Avoid getting constipated. When taking pain medications, it is common to experience some constipation. Increasing fluid intake and taking a fiber supplement (such as Metamucil, Citrucel, FiberCon, MiraLax, etc) 1-2 times a day regularly will usually help prevent this problem from occurring. A mild laxative (prune juice, Milk of Magnesia, MiraLax, etc) should be taken according to package  directions if there are no bowel movements after 48 hours.  2. Watch out for diarrhea. If you have many loose bowel movements, simplify your diet to bland foods & liquids for a few days. Stop any stool softeners and decrease your fiber supplement. Switching to mild anti-diarrheal medications (Kayopectate, Pepto Bismol) can help. If this worsens or does not improve, please call us. 3. Chest tube site wound: you may remove the dressing from your chest tube site 3 days after the removal of your chest tube. DO NOT shower over the dressing. Once   removed, you may shower as normal. Do not submerge your wound in water for 2-3 weeks.  4. FOLLOW UP  a. Please call our office to set up or confirm an appointment for follow up for 2 weeks after discharge. You will need to get a chest xray at either [facility] or [facility]. This will be outlined in your follow up instructions. Please call [facility] at [phone] if you have any questions about follow up.  b. If you have any orthopedic or other injuries you will need to follow up as outlined in your follow up instructions.   WHEN TO CALL us [phone]:  1. Poor pain control 2. Reactions / problems with new medications (rash/itching, nausea, etc)  3. Fever over 101.5 F (38.5 C) 4. Worsening swelling or bruising 5. Redness, drainage, pain or swelling around chest tube site 6. Worsening pain, productive cough, difficulty breathing or any other concerning symptoms  The clinic staff is available to answer your questions during regular business hours (8:30am-5pm). Please dont hesitate to call and ask to  speak to one of our nurses for clinical concerns.  If you have a medical emergency, go to the nearest emergency room or call 911.  A surgeon from [facility] is always on call at the [facility], [place], [place], [place],  [address] ?  MAIN: [phone] ? TOLL FREE: [phone] ?    FAX [phone]  [email or web address]      Information on Rib Fractures  A rib fracture is a break or crack in one of the bones of the ribs. The ribs are long, curved bones that wrap around your chest and attach to your spine and your breastbone. The ribs protect your heart, lungs, and other organs in the chest. A broken or cracked rib is often painful but is not usually serious. Most rib fractures heal on their own over time. However, rib fractures can be more serious if multiple ribs are broken or if broken ribs move out of place and push against other structures or organs. What are the causes? This condition is caused by:  Repetitive movements with high force, such as pitching a baseball or having severe coughing spells.  A direct blow to the chest, such as a sports injury, a car accident, or a fall.  Cancer that has spread to the bones, which can weaken bones and cause them to break. What are the signs or symptoms? Symptoms of this condition include:  Pain when you breathe in or cough.  Pain when someone presses on the injured area.  Feeling short of breath. How is this diagnosed? This condition is diagnosed with a physical exam and medical history. Imaging tests may also be done, such as:  Chest X-ray.  CT scan.  MRI.  Bone scan.  Chest ultrasound. How is this treated? Treatment for this condition depends on the severity of the fracture. Most rib fractures usually heal on their own in 1-3 months. Sometimes healing takes longer if there is a cough that does not stop or if there are other activities that make the injury worse (aggravating factors). While you heal, you will be given medicines to control the pain. You will also be taught deep breathing exercises. Severe injuries may require hospitalization or surgery. Follow these instructions at home: Managing pain, stiffness, and swelling  If directed, apply ice to the injured area. ? Put ice in a  plastic bag. ? Place a towel between your skin and the bag. ? Leave the ice on for 20 minutes, 2-3 times a day.  Take over-the-counter and prescription medicines only as told by your health care provider. Activity  Avoid a lot of activity and any activities or movements that cause pain. Be careful during activities and avoid bumping the injured rib.  Slowly increase your activity as told by your health care provider. General instructions  Do deep breathing exercises as told by your health care provider. This helps prevent pneumonia, which is a common complication of a broken rib. Your health care provider may instruct you to: ? Take deep breaths several times a day. ? Try to cough several times a day, holding a pillow against the injured area. ? Use a device called incentive spirometer to practice deep breathing several times a day.  Drink enough fluid to keep your urine pale yellow.  Do not wear a rib belt or binder. These restrict breathing, which can lead to pneumonia.  Keep all follow-up visits as told by your health care provider.  This is important. Contact a health care provider if:  You have a fever. Get help right away if:  You have difficulty breathing or you are short of breath.  You develop a cough that does not stop, or you cough up thick or bloody sputum.  You have nausea, vomiting, or pain in your abdomen.  Your pain gets worse and medicine does not help. Summary  A rib fracture is a break or crack in one of the bones of the ribs.  A broken or cracked rib is often painful but is not usually serious.  Most rib fractures heal on their own over time.  Treatment for this condition depends on the severity of the fracture.  Avoid a lot of activity and any activities or movements that cause pain. This information is not intended to replace advice given to you by your health care provider. Make sure you discuss any questions you have with your health care  provider. Document Released: 10/18/2005 Document Revised: 01/17/2017 Document Reviewed: 01/17/2017 Elsevier Interactive Patient Education  2019 [employer]    Pneumothorax A pneumothorax is commonly called a collapsed lung. It is a condition in which air leaks from a lung and builds up between the thin layer of tissue that covers the lungs (visceral pleura) and the interior wall of the chest cavity (parietal pleura). The air gets trapped outside the lung, between the lung and the chest wall (pleural space). The air takes up space and prevents the lung from fully expanding. This condition sometimes occurs suddenly with no apparent cause. The buildup of air may be small or large. A small pneumothorax may go away on its own. A large pneumothorax will require treatment and hospitalization. What are the causes? This condition may be caused by:  Trauma and injury to the chest wall.  Surgery and other medical procedures.  A complication of an underlying lung problem, especially chronic obstructive pulmonary disease (COPD) or emphysema. Sometimes the cause of this condition is not known. What increases the risk? You are more likely to develop this condition if:  You have an underlying lung problem.  You smoke.  You are 50-14 years old, male, tall, and underweight.  You have a personal or family history of pneumothorax.  You have an eating disorder (anorexia nervosa). This condition can also happen quickly, even in people with no history of lung problems. What are the signs or symptoms? Sometimes a pneumothorax will have no symptoms. When symptoms are present, they can include:  Chest pain.  Shortness of breath.  Increased rate of breathing.  Bluish color to your lips or skin (cyanosis). How is this diagnosed? This condition may be diagnosed by:  A medical history and physical exam.  A chest X-ray, chest CT scan, or ultrasound. How is this treated? Treatment depends on how  severe your condition is. The goal of treatment is to remove the extra air and allow your lung to expand back to its normal size.  For a small pneumothorax: ? No treatment may be needed. ? Extra oxygen is sometimes used to make it go away more quickly.  For a large pneumothorax or a pneumothorax that is causing symptoms, a procedure is done to drain the air from your lungs. To do this, a health care provider may use: ? A needle with a syringe. This is used to suck air from a pleural space where no additional leakage is taking place. ? A chest tube. This is used to suck air where there  is ongoing leakage into the pleural space. The chest tube may need to remain in place for several days until the air leak has healed.  In more severe cases, surgery may be needed to repair the damage that is causing the leak.  If you have multiple pneumothorax episodes or have an air leak that will not heal, a procedure called a pleurodesis may be done. A medicine is placed in the pleural space to irritate the tissues around the lung so that the lung will stick to the chest wall, seal any leaks, and stop any buildup of air in that space. If you have an underlying lung problem, severe symptoms, or a large pneumothorax you will usually need to stay in the hospital. Follow these instructions at home: Lifestyle  Do not use any products that contain nicotine or tobacco, such as cigarettes and e-cigarettes. These are major risk factors in pneumothorax. If you need help quitting, ask your health care provider.  Do not lift anything that is heavier than 10 lb (4.5 kg), or the limit that your health care provider tells you, until he or she says that it is safe.  Avoid activities that take a lot of effort (strenuous) for as long as told by your health care provider.  Return to your normal activities as told by your health care provider. Ask your health care provider what activities are safe for you.  Do not fly in an  airplane or scuba dive until your health care provider says it is okay. General instructions  Take over-the-counter and prescription medicines only as told by your health care provider.  If a cough or pain makes it difficult for you to sleep at night, try sleeping in a semi-upright position in a recliner or by using 2 or 3 pillows.  If you had a chest tube and it was removed, ask your health care provider when you can remove the bandage (dressing). While the dressing is in place, do not allow it to get wet.  Keep all follow-up visits as told by your health care provider. This is important. Contact a health care provider if:  You cough up thick mucus (sputum) that is yellow or green in color.  You were treated with a chest tube, and you have redness, increasing pain, or discharge at the site where it was placed. Get help right away if:  You have increasing chest pain or shortness of breath.  You have a cough that will not go away.  You begin coughing up blood.  You have pain that is getting worse or is not controlled with medicines.  The site where your chest tube was located opens up.  You feel air coming out of the site where the chest tube was placed.  You have a fever or persistent symptoms for more than 2-3 days.  You have a fever and your symptoms suddenly get worse. These symptoms may represent a serious problem that is an emergency. Do not wait to see if the symptoms will go away. Get medical help right away. Call your local emergency services (911 in the [place] Do not drive yourself to the hospital. Summary  A pneumothorax, commonly called a collapsed lung, is a condition in which air leaks from a lung and gets trapped between the lung and the chest wall (pleural space).  The buildup of air may be small or large. A small pneumothorax may go away on its own. A large pneumothorax will require treatment and hospitalization.  Treatment  for this condition depends on how  severe the pneumothorax is. The goal of treatment is to remove the extra air and allow the lung to expand back to its normal size. This information is not intended to replace advice given to you by your health care provider. Make sure you discuss any questions you have with your health care provider. Document Released: 10/18/2005 Document Revised: 09/26/2017 Document Reviewed: 09/26/2017 Elsevier Interactive Patient Education  2019 [employer] Inc.   Hematoma A hematoma is a collection of blood under the skin, in an organ, in a body space, in a joint space, or in other tissue. The blood can thicken (clot) to form a lump that you can see and feel. The lump is often firm and may become sore and tender. Most hematomas get better in a few days to weeks. However, some hematomas may be serious and require medical care. Hematomas can range from very small to very large. What are the causes? This condition is caused by:  A blunt or penetrating injury.  A leakage from a blood vessel under the skin.  Some medical procedures, including surgeries, such as oral surgery, face lifts, and surgeries on the joints.  Some medical conditions that cause bleeding or bruising. There may be multiple hematomas that appear in different areas of the body. What increases the risk? You are more likely to develop this condition if:  You are an older adult.  You use blood thinners. What are the signs or symptoms?  Symptoms of this condition depend on where the hematoma is located.  Common symptoms of a hematoma that is under the skin include:  A firm lump on the body.  Pain and tenderness in the area.  Bruising. Blue, dark blue, purple-red, or yellowish skin (discoloration) may appear at the site of the hematoma if the hematoma is close to the surface of the skin. Common symptoms of a hematoma that is deep in the tissues or body spaces may be less obvious. They include:  A collection of blood in the stomach  (intra-abdominal hematoma). This may cause pain in the abdomen, weakness, fainting, and shortness of breath.  A collection of blood in the head (intracranial hematoma). This may cause a headache or symptoms such as weakness, trouble speaking or understanding, or a change in consciousness. How is this diagnosed? This condition is diagnosed based on:  Your medical history.  A physical exam.  Imaging tests, such as an ultrasound or CT scan. These may be needed if your health care provider suspects a hematoma in deeper tissues or body spaces.  Blood tests. These may be needed if your health care provider believes that the hematoma is caused by a medical condition. How is this treated? Treatment for this condition depends on the cause, size, and location of the hematoma. Treatment may include:  Doing nothing. The majority of hematomas do not need treatment as many of them go away on their own over time.  Surgery or close monitoring. This may be needed for large hematomas or hematomas that affect vital organs.  Medicines. Medicines may be given if there is an underlying medical cause for the hematoma. Follow these instructions at home: Managing pain, stiffness, and swelling   If directed, put ice on the affected area. ? Put ice in a plastic bag. ? Place a towel between your skin and the bag. ? Leave the ice on for 20 minutes, 2-3 times a day for the first couple of days.  If directed, apply heat  to the affected area after applying ice for a couple of days. Use the heat source that your health care provider recommends, such as a moist heat pack or a heating pad. ? Place a towel between your skin and the heat source. ? Leave the heat on for 20-30 minutes. ? Remove the heat if your skin turns bright red. This is especially important if you are unable to feel pain, heat, or cold. You may have a greater risk of getting burned.  Raise (elevate) the affected area above the level of your heart  while you are sitting or lying down.  If told, wrap the affected area with an elastic bandage. The bandage applies pressure (compression) to the area, which may help to reduce swelling and promote healing. Do not wrap the bandage too tightly around the affected area.  If your hematoma is on a leg or foot (lower extremity) and is painful, your health care provider may recommend crutches. Use them as told by your health care provider. General instructions  Take over-the-counter and prescription medicines only as told by your health care provider.  Keep all follow-up visits as told by your health care provider. This is important. Contact a health care provider if:  You have a fever.  The swelling or discoloration gets worse.  You develop more hematomas. Get help right away if:  Your pain is worse or your pain is not controlled with medicine.  Your skin over the hematoma breaks or starts bleeding.  Your hematoma is in your chest or abdomen and you have weakness, shortness of breath, or a change in consciousness.  You have a hematoma on your scalp that is caused by a fall or injury, and you also have: ? A headache that gets worse. ? Trouble speaking or understanding speech. ? Weakness. ? Change in alertness or consciousness. Summary  A hematoma is a collection of blood under the skin, in an organ, in a body space, in a joint space, or in other tissue.  This condition usually does not need treatment because many hematomas go away on their own over time.  Large hematomas, or those that may affect vital organs, may need surgical drainage or monitoring. If the hematoma is caused by a medical condition, medicines may be prescribed.  Get help right away if your hematoma breaks or starts to bleed, you have shortness of breath, or you have a headache or trouble speaking after a fall. This information is not intended to replace advice given to you by your health care provider. Make sure you  discuss any questions you have with your health care provider. Document Released: 06/01/2004 Document Revised: 03/23/2018 Document Reviewed: 03/23/2018 Elsevier Patient Education  2020 [employer].

## 2019-09-19 NOTE — TOC Transition Note (Signed)
Transition of Care [facility]) - CM/SW Discharge Note   Patient Details  Name: Randy Myers MRN: [id number] Date of Birth: 1946-03-06  [facility]) CM/SW Contact:  Ella Bodo, RN Phone Number: 09/19/2019, 2:49 PM   Clinical Narrative:  Pt medically stable for discharge home today with wife; he has weaned successful from oxygen.  Wife able to provide 24h supervision at dc.  Pt politely declines HH follow up and DME.       Final next level of care: Home/Self Care Barriers to Discharge: Barriers Resolved                       Discharge Plan and Services   Discharge Planning Services: CM Consult                                 Social Determinants of Health (SDOH) Interventions     Readmission Risk Interventions Readmission Risk Prevention Plan 09/19/2019  Post Dischage Appt Complete  Medication Screening Complete  Transportation Screening Complete   Reinaldo Raddle, RN, BSN  Trauma/Neuro ICU Case Manager [phone]

## 2019-09-19 NOTE — Progress Notes (Addendum)
Per telemetry, pt had 5 runs of VTACH. Pt asymptomatic, alert and oriented. Notified MD. Will monitor pt.

## 2019-09-19 NOTE — Progress Notes (Signed)
Occupational Therapy Treatment Patient Details Name: Randy Myers MRN: [id number] DOB: 04-May-1946 Today's Date: 09/19/2019    History of present illness 73 y.o male presents from home s/p fall from ladder with multiple rib fx, large Lt flank hematoma, and L3/L4 TVP fx, s/p chest tube placement. Significant PMH includes CAD and prostate cancer.   OT comments  Pt making steady progress towards OT goals this session. Pt complete simulated toilet transfer with MIN - MOD A with no AD. Pt with poor balance needing MOD A initially during sit>stand, progressing to MIN A for intermittent balance. Recommended pt use RW/ SPC at home d/t balance deficits. Pt complete LB ADL with supervision assist via figure four. Discussed DME needs with pt and wife. Updated DME recs as pt and wife report they would like to get their own tub transfer bench. Will let OTR know about change in POC. Will continue to follow acutely per POC.    Follow Up Recommendations  No OT follow up;Other (comment)(May need follow-up after evaluation of right shoulder dysfunction from MD but could likley be outpatient)    Equipment Recommendations  None recommended by OT    Recommendations for Other Services      Precautions / Restrictions Precautions Precautions: Fall;Other (comment) Restrictions Weight Bearing Restrictions: No       Mobility Bed Mobility               General bed mobility comments: OOB in recliner  Transfers Overall transfer level: Needs assistance Equipment used: None Transfers: Sit to/from Stand Sit to Stand: Min assist;Mod assist         General transfer comment: MOD A for steadying for initial sit>stand; progressed to MIN A with no AD. Recommend RW/ [facility] for functional mobility in home    Balance Overall balance assessment: Needs assistance Sitting-balance support: Feet supported;No upper extremity supported Sitting balance-Leahy Scale: Fair Sitting balance - Comments: able to access  feet with no LOB   Standing balance support: During functional activity;Single extremity supported Standing balance-Leahy Scale: Poor                             ADL either performed or assessed with clinical judgement   ADL Overall ADL's : Needs assistance/impaired                     Lower Body Dressing: Supervision/safety;Sitting/lateral leans Lower Body Dressing Details (indicate cue type and reason): able to access feet via figure four Toilet Transfer: Minimal assistance;Moderate assistance;Ambulation Toilet Transfer Details (indicate cue type and reason): simulated toilet transfer via functional mobility with no AD; pt slightly off balance needing MOD A intermittently to maintain balance during transitions       Tub/Shower Transfer Details (indicate cue type and reason): family reports that they want to purchase their own tub transfer bench when ready Functional mobility during ADLs: Minimal assistance;Moderate assistance General ADL Comments: pt completed functional mobility in room with no AD and min - MOD A for balance. recommended pt use AD at home for safe functional mobility, pt supervision for LB ADL. Pt and wife report wanting to purchase their own tub bench for safe shower transfers     Vision Baseline Vision/History: No visual deficits Patient Visual Report: No change from baseline Vision Assessment?: No apparent visual deficits   Perception     Praxis      Cognition Arousal/Alertness: Awake/alert Behavior During Therapy: WFL for tasks assessed/performed  Overall Cognitive Status: Within Functional Limits for tasks assessed                                          Exercises     Shoulder Instructions       General Comments pt on RA throughout session with SpO2 98% after functional mobility    Pertinent Vitals/ Pain       Pain Assessment: No/denies pain  Home Living                                           Prior Functioning/Environment              Frequency  Min 2X/week        Progress Toward Goals  OT Goals(current goals can now be found in the care plan section)  Progress towards OT goals: Progressing toward goals  Acute Rehab OT Goals Patient Stated Goal: Pt wants to go home OT Goal Formulation: With patient Time For Goal Achievement: 10/02/19 Potential to Achieve Goals: Good  Plan Discharge plan remains appropriate    Co-evaluation                 AM-PAC OT "6 Clicks" Daily Activity     Outcome Measure   Help from another person eating meals?: None Help from another person taking care of personal grooming?: A Little Help from another person toileting, which includes using toliet, bedpan, or urinal?: A Little Help from another person bathing (including washing, rinsing, drying)?: A Little Help from another person to put on and taking off regular upper body clothing?: A Little Help from another person to put on and taking off regular lower body clothing?: A Little 6 Click Score: 19    End of Session Equipment Utilized During Treatment: Gait belt  OT Visit Diagnosis: Muscle weakness (generalized) (M62.81)   Activity Tolerance Patient tolerated treatment well   Patient Left in chair;with call bell/phone within reach;with family/visitor present   Nurse Communication Mobility status;Other (comment)(left pt on RA)        Time: [id number] OT Time Calculation (min): 25 min  Charges: OT General Charges $OT Visit: 1 Visit OT Treatments $Self Care/Home Management : 8-22 mins $Therapeutic Activity: 8-22 mins Lanier Clam., COTA/L Acute Rehabilitation Services [id number]    Ihor Gully 09/19/2019, 1:00 PM

## 2019-09-21 ENCOUNTER — Encounter (HOSPITAL_COMMUNITY): Payer: Self-pay

## 2019-10-03 ENCOUNTER — Other Ambulatory Visit: Payer: Self-pay

## 2019-10-03 ENCOUNTER — Ambulatory Visit
Admission: RE | Admit: 2019-10-03 | Discharge: 2019-10-03 | Disposition: A | Discharge status: Home / Self Care | Payer: Medicare HMO | Source: Ambulatory Visit | Attending: General Surgery | Admitting: General Surgery

## 2019-10-03 ENCOUNTER — Other Ambulatory Visit: Payer: Self-pay | Admitting: General Surgery

## 2019-10-03 DIAGNOSIS — J939 Pneumothorax, unspecified: Secondary | ICD-10-CM

## 2019-10-03 DIAGNOSIS — S2242XA Multiple fractures of ribs, left side, initial encounter for closed fracture: Secondary | ICD-10-CM | POA: Diagnosis not present

## 2019-10-04 DIAGNOSIS — J939 Pneumothorax, unspecified: Secondary | ICD-10-CM | POA: Diagnosis not present

## 2019-10-04 DIAGNOSIS — S2239XA Fracture of one rib, unspecified side, initial encounter for closed fracture: Secondary | ICD-10-CM | POA: Diagnosis not present

## 2019-10-04 DIAGNOSIS — S32009A Unspecified fracture of unspecified lumbar vertebra, initial encounter for closed fracture: Secondary | ICD-10-CM | POA: Diagnosis not present

## 2019-10-04 DIAGNOSIS — S2232XA Fracture of one rib, left side, initial encounter for closed fracture: Secondary | ICD-10-CM | POA: Diagnosis not present

## 2019-10-04 DIAGNOSIS — Z6830 Body mass index (BMI) 30.0-30.9, adult: Secondary | ICD-10-CM | POA: Diagnosis not present

## 2019-11-21 DIAGNOSIS — Z8546 Personal history of malignant neoplasm of prostate: Secondary | ICD-10-CM | POA: Diagnosis not present

## 2019-11-21 DIAGNOSIS — I10 Essential (primary) hypertension: Secondary | ICD-10-CM | POA: Diagnosis not present

## 2019-11-21 DIAGNOSIS — C61 Malignant neoplasm of prostate: Secondary | ICD-10-CM | POA: Diagnosis not present

## 2019-11-21 DIAGNOSIS — E7800 Pure hypercholesterolemia, unspecified: Secondary | ICD-10-CM | POA: Diagnosis not present

## 2019-11-22 DIAGNOSIS — L570 Actinic keratosis: Secondary | ICD-10-CM | POA: Diagnosis not present

## 2019-11-22 DIAGNOSIS — D225 Melanocytic nevi of trunk: Secondary | ICD-10-CM | POA: Diagnosis not present

## 2019-11-22 DIAGNOSIS — D3617 Benign neoplasm of peripheral nerves and autonomic nervous system of trunk, unspecified: Secondary | ICD-10-CM | POA: Diagnosis not present

## 2019-11-22 DIAGNOSIS — D045 Carcinoma in situ of skin of trunk: Secondary | ICD-10-CM | POA: Diagnosis not present

## 2019-11-22 DIAGNOSIS — D0461 Carcinoma in situ of skin of right upper limb, including shoulder: Secondary | ICD-10-CM | POA: Diagnosis not present

## 2019-12-30 ENCOUNTER — Ambulatory Visit: Payer: Medicare HMO | Attending: Internal Medicine

## 2019-12-30 DIAGNOSIS — Z23 Encounter for immunization: Secondary | ICD-10-CM | POA: Insufficient documentation

## 2019-12-30 NOTE — Progress Notes (Signed)
   Covid-19 Vaccination Clinic  Name:  Randy Myers    MRN: [id number] DOB: December 11, 1945  12/30/2019  [name] was observed post Covid-19 immunization for 15 minutes without incidence. He was provided with Vaccine Information Sheet and instruction to access the V-Safe system.   [name] was instructed to call 911 with any severe reactions post vaccine: Marland Kitchen Difficulty breathing  . Swelling of your face and throat  . A fast heartbeat  . A bad rash all over your body  . Dizziness and weakness    Immunizations Administered    Name Date Dose VIS Date Route   Pfizer COVID-19 Vaccine 12/30/2019  3:04 PM 0.3 mL 10/12/2019 Intramuscular   Manufacturer: [place]   Lot: [id number]   [place]: [id number]

## 2020-01-11 DIAGNOSIS — H35341 Macular cyst, hole, or pseudohole, right eye: Secondary | ICD-10-CM | POA: Diagnosis not present

## 2020-01-11 DIAGNOSIS — S0511XS Contusion of eyeball and orbital tissues, right eye, sequela: Secondary | ICD-10-CM | POA: Diagnosis not present

## 2020-01-11 DIAGNOSIS — H353111 Nonexudative age-related macular degeneration, right eye, early dry stage: Secondary | ICD-10-CM | POA: Diagnosis not present

## 2020-01-11 DIAGNOSIS — H3581 Retinal edema: Secondary | ICD-10-CM | POA: Diagnosis not present

## 2020-01-11 DIAGNOSIS — H25813 Combined forms of age-related cataract, bilateral: Secondary | ICD-10-CM | POA: Diagnosis not present

## 2020-01-11 DIAGNOSIS — H35363 Drusen (degenerative) of macula, bilateral: Secondary | ICD-10-CM | POA: Diagnosis not present

## 2020-01-29 ENCOUNTER — Ambulatory Visit: Payer: Medicare HMO | Attending: Internal Medicine

## 2020-01-29 DIAGNOSIS — Z23 Encounter for immunization: Secondary | ICD-10-CM

## 2020-01-29 NOTE — Progress Notes (Signed)
   Covid-19 Vaccination Clinic  Name:  Randy Myers    MRN: [id number] DOB: 1946-10-24  01/29/2020  [name] was observed post Covid-19 immunization for 15 minutes without incident. He was provided with Vaccine Information Sheet and instruction to access the V-Safe system.   [name] was instructed to call 911 with any severe reactions post vaccine: Marland Kitchen Difficulty breathing  . Swelling of face and throat  . A fast heartbeat  . A bad rash all over body  . Dizziness and weakness   Immunizations Administered    Name Date Dose VIS Date Route   Pfizer COVID-19 Vaccine 01/29/2020  2:45 PM 0.3 mL 10/12/2019 Intramuscular   Manufacturer: [employer]   Lot: [id number]   [place]: [id number]

## 2020-03-02 IMAGING — CT CT HEAD W/O CM
4 series · 16 of 47 positions shown, 18 images · non-contrast
Comparison: 06/11/2013

CLINICAL DATA: Fall 15 feet wall trimming branches with headaches
and neck pain, initial encounter

EXAM:
CT HEAD WITHOUT CONTRAST
CT CERVICAL SPINE WITHOUT CONTRAST
TECHNIQUE: Multidetector CT imaging of the head and cervical spine was
performed following the standard protocol without intravenous
contrast. Multiplanar CT image reconstructions of the cervical spine
were also generated.

[Series 3: head wo · axial · 0.49mm/px · z∈[+1232,+1378]mm · 7 of 39 slices shown, 9 images]
[im 5/39  brain]
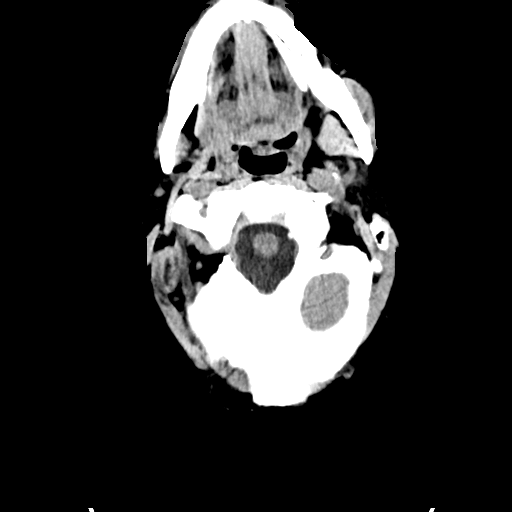
[im 5/39  bone]
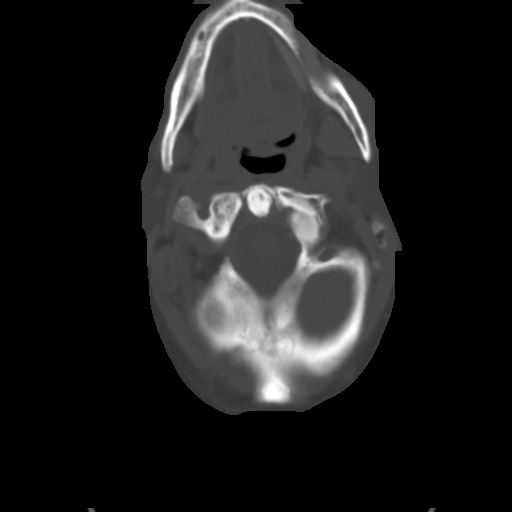
[im 10/39  brain]
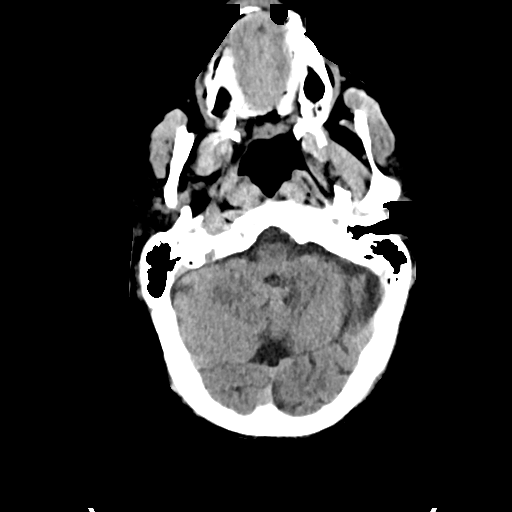
[im 15/39  brain]
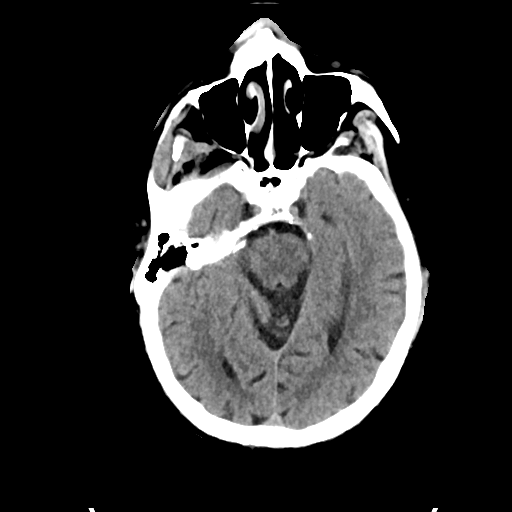
[im 20/39  brain]
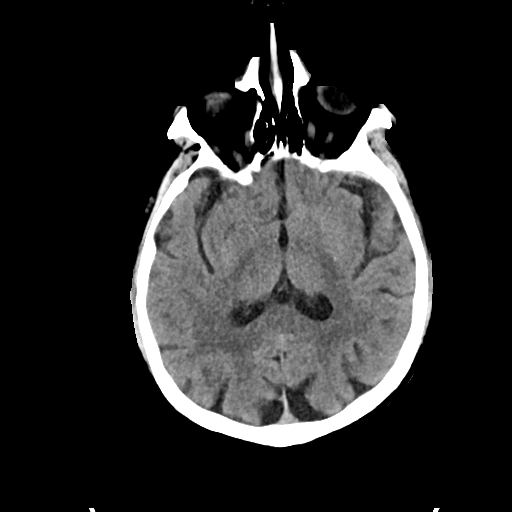
[im 24/39  brain]
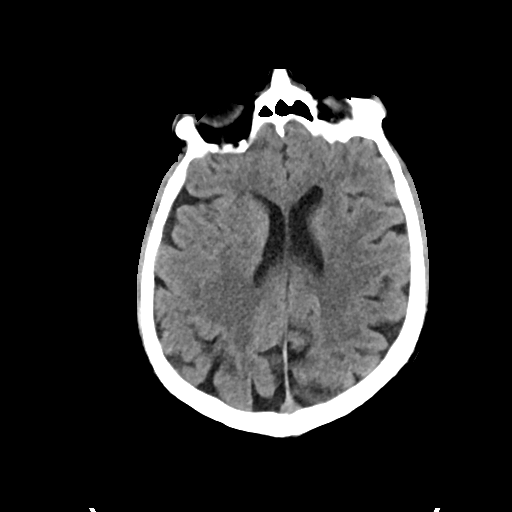
[im 24/39  bone]
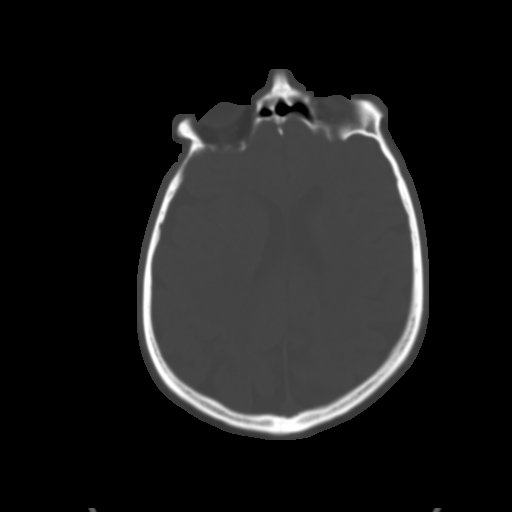
[im 29/39  brain]
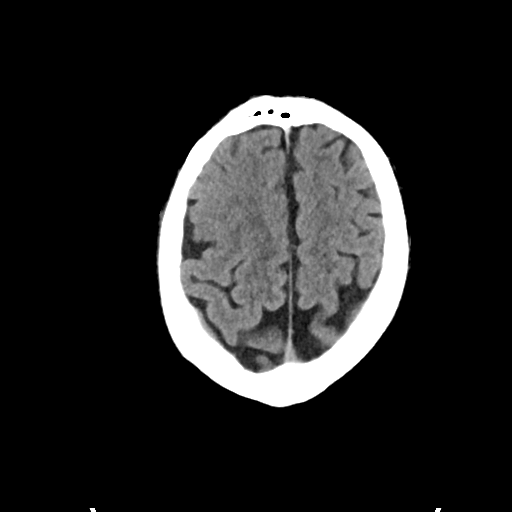
[im 34/39  brain]
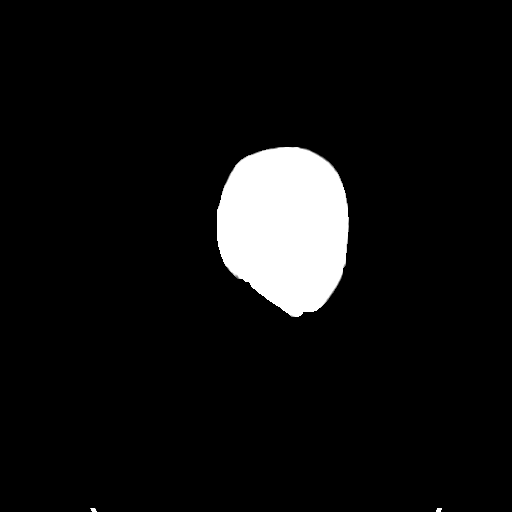

[Series 4: head bone · axial · 0.49mm/px · z∈[+1230,+1268]mm · 3 of 97 slices shown]
[im 10/97  bone]
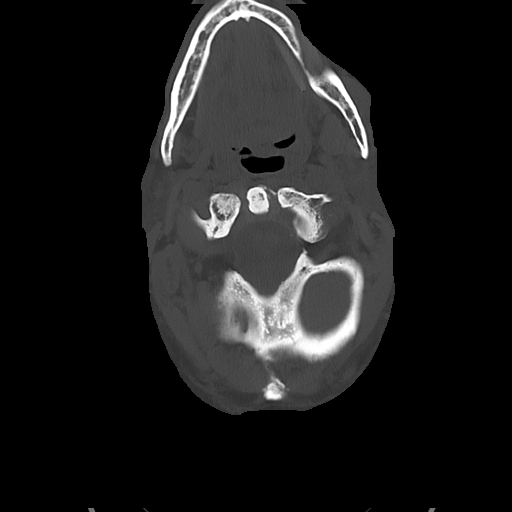
[im 20/97  bone]
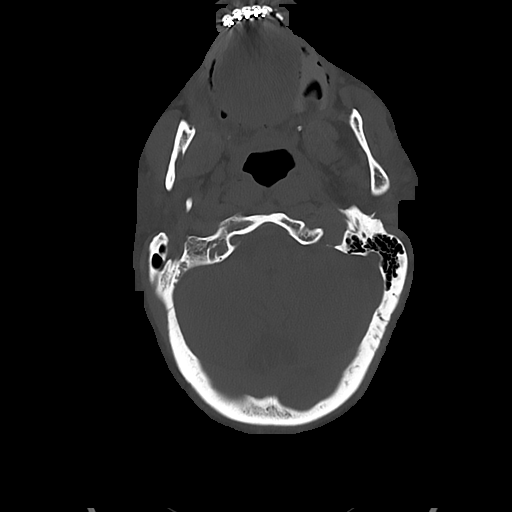
[im 29/97  bone]
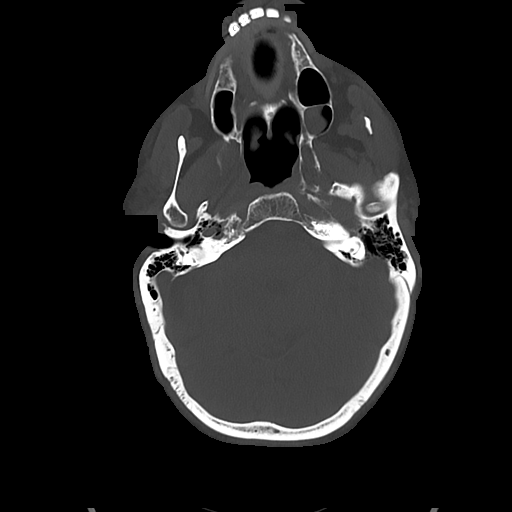

[Series 5: cor soft · coronal · 0.37mm/px · 3 of 79 slices shown]
[im 31/79  brain]
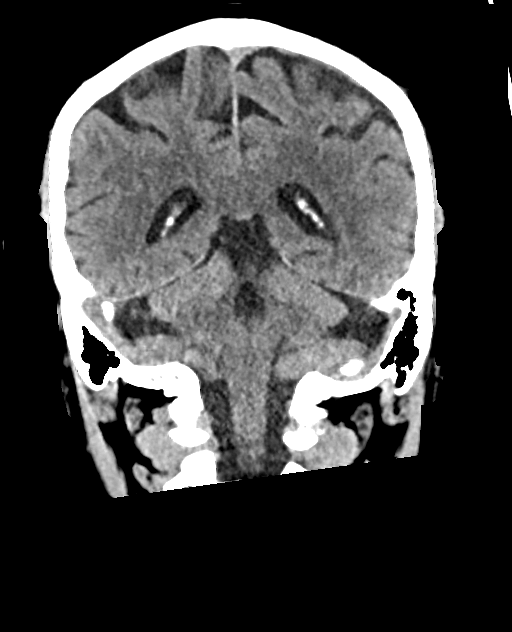
[im 37/79  brain]
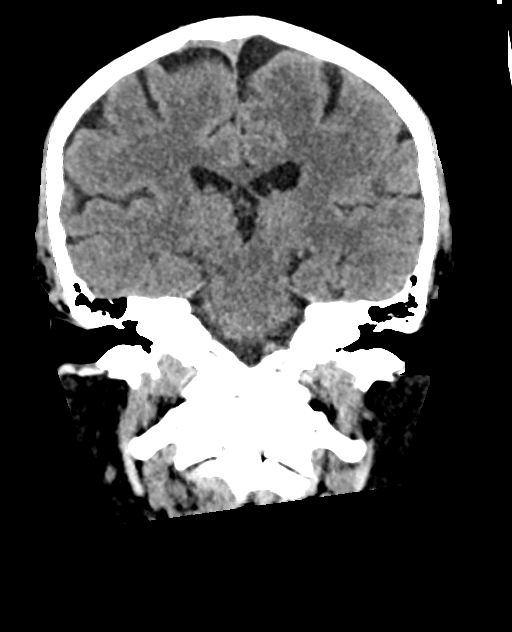
[im 43/79  brain]
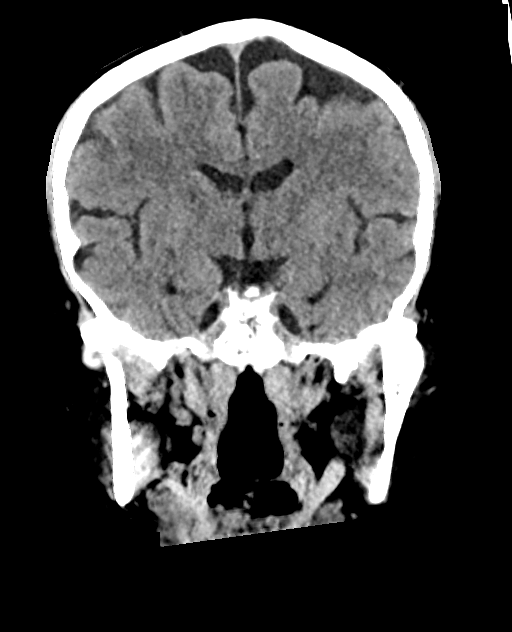

[Series 6: sag soft · sagittal · 0.45mm/px · 3 of 62 slices shown]
[im 22/62  brain]
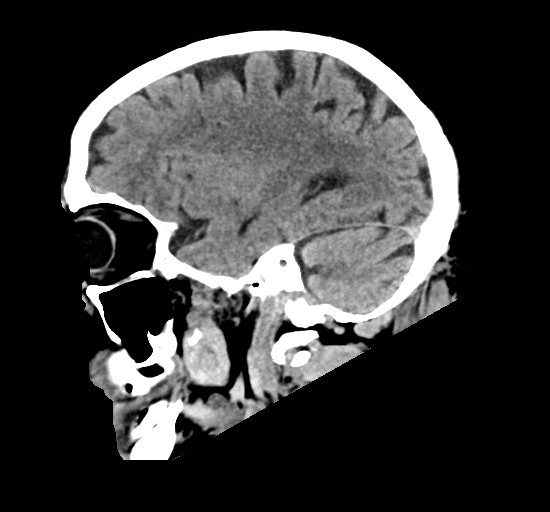
[im 31/62  brain]
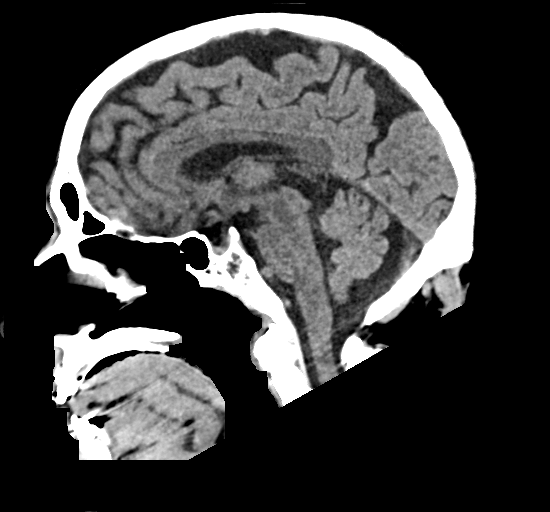
[im 40/62  brain]
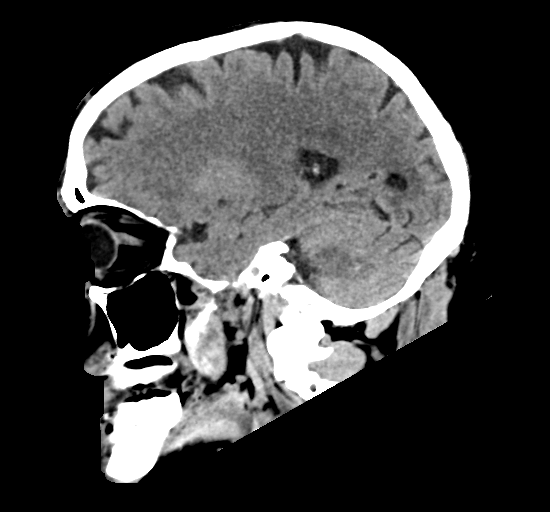

[16 of 47 positions shown; findings below may reference images not displayed]

FINDINGS: CT HEAD FINDINGS

Brain: Mild atrophic changes are noted. No findings to suggest acute
hemorrhage, acute infarction or space-occupying mass lesion are
seen.

Vascular: Calcifications of the distal internal carotid artery and
vertebral arteries are seen.

Skull: Normal. Negative for fracture or focal lesion.

Sinuses/Orbits: No acute finding.

Other: None.

CT CERVICAL SPINE FINDINGS

Alignment: Within normal limits.

Skull base and vertebrae: 7 cervical segments are well visualized.
Vertebral body height is well maintained. Disc space narrowing is
noted at C5-6 and C6-7. Multilevel osteophytic changes are noted
from C4 to T2. Facet hypertrophic changes are noted. No acute
fracture or acute facet abnormality is noted.

Soft tissues and spinal canal: Surrounding soft tissue structures
show no acute abnormality. Air is noted within the left internal
jugular vein consistent with the recent IV start.

Upper chest: Upper chest demonstrates an undisplaced fracture of the
left first rib adjacent to the costovertebral junction. Suggestion
of undisplaced left second rib fracture centrally is noted. Tiny
left pneumothorax is seen.

Other: None
IMPRESSION: CT of the head: Chronic atrophic changes without acute intracranial
abnormality.

CT of the cervical spine: Multilevel degenerative change without
acute abnormality in the cervical spine.

Fracture of the posterior aspect of the left first rib with
associated small pneumothorax. This is better visualized on the CT
of the chest.

Critical Value/emergent results were called by telephone at the time
of interpretation on 09/14/2019 at [DATE] to Dr. JECKY , who
verbally acknowledged these results.

## 2020-03-03 IMAGING — DX DG CHEST 1V PORT
1 series · 1 of 1 positions shown · non-contrast
Comparison: 09/14/2019

CLINICAL DATA: Pneumothorax.  Injury.

EXAM:
PORTABLE CHEST 1 VIEW

[chest]
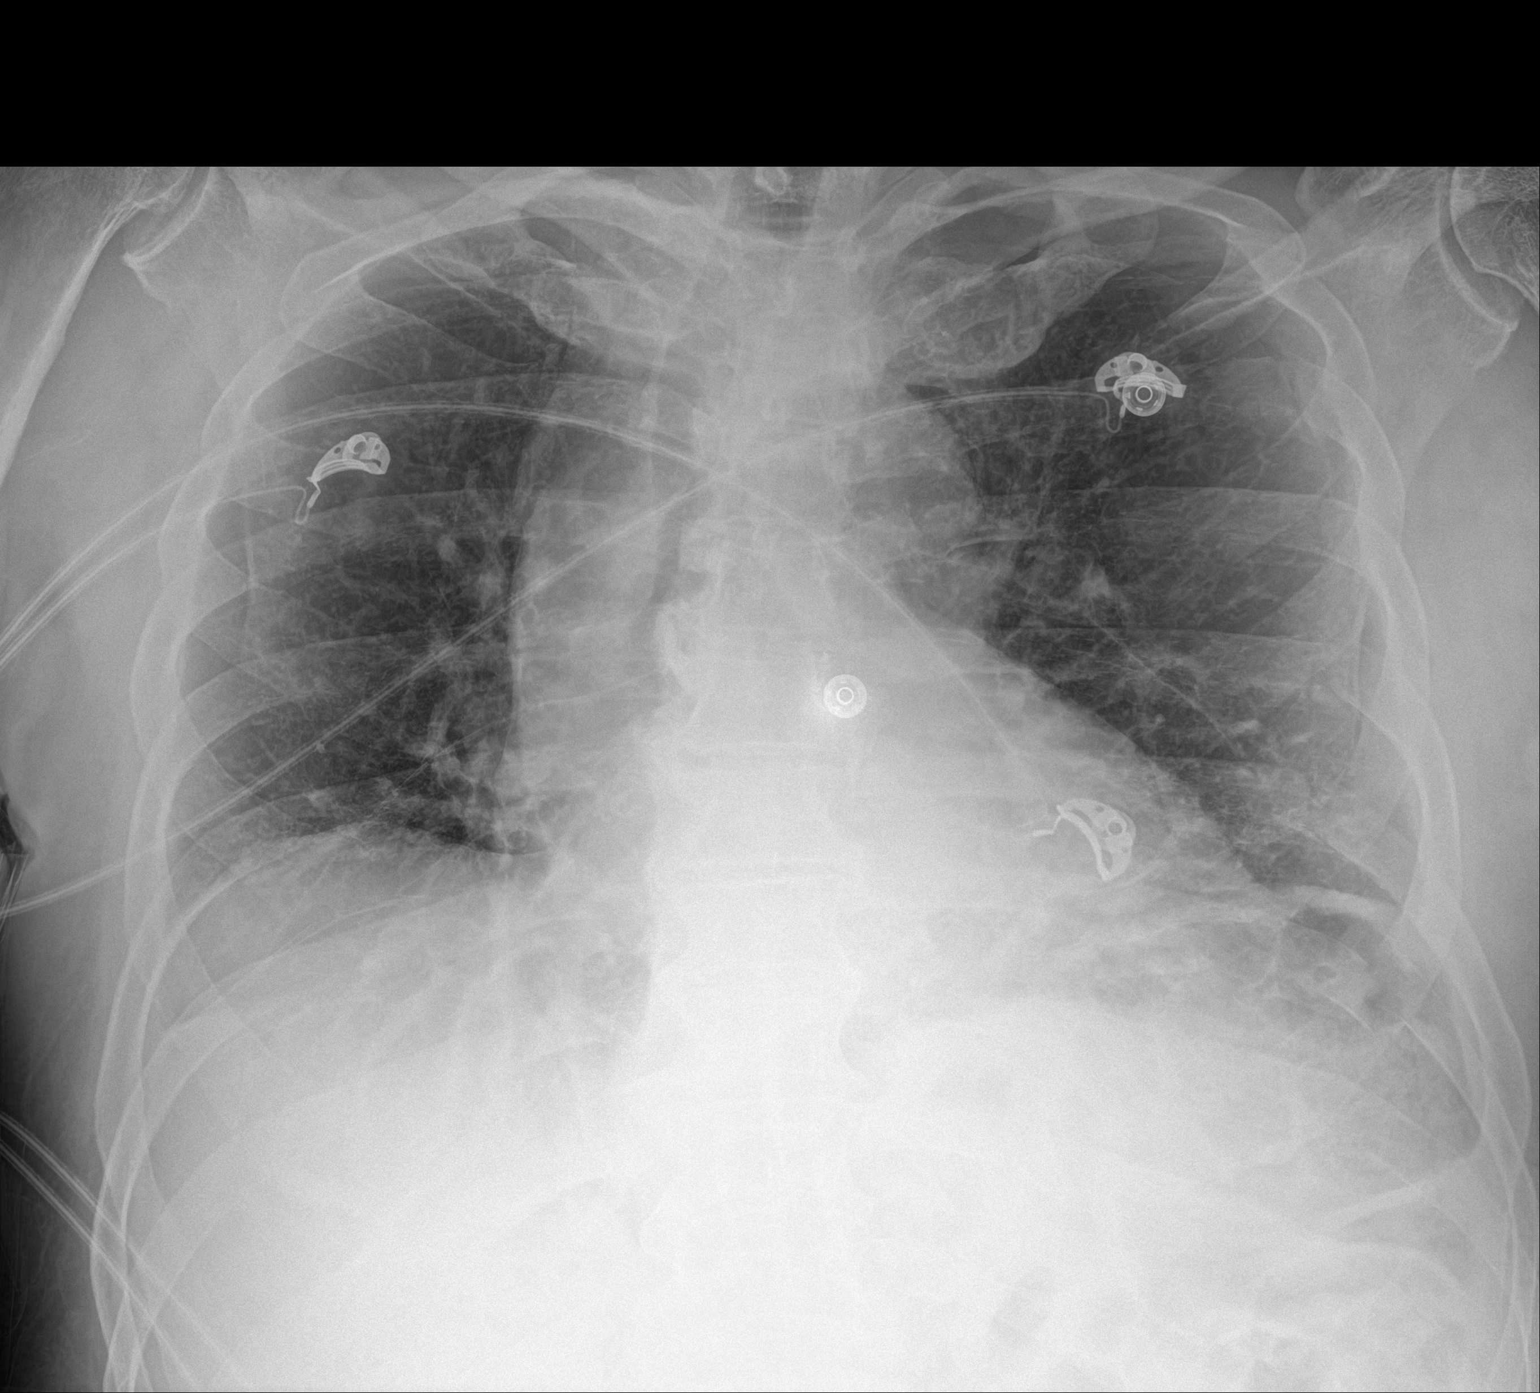

[1 of 1 positions shown; findings below may reference images not displayed]

FINDINGS: Progression of left pneumothorax which was not present in the apex
as well as laterally. Estimated 20% left pneumothorax. No
significant pleural effusion. Multiple left rib fractures.

Progression mild left lower lobe atelectasis. Right lung clear.
Negative for heart failure.
IMPRESSION: Progression of left pneumothorax now approximately 20%. Progression
of left lower lobe atelectasis. No significant effusion.

## 2020-03-11 DIAGNOSIS — Z8546 Personal history of malignant neoplasm of prostate: Secondary | ICD-10-CM | POA: Diagnosis not present

## 2020-03-17 DIAGNOSIS — Z8546 Personal history of malignant neoplasm of prostate: Secondary | ICD-10-CM | POA: Diagnosis not present

## 2020-03-17 DIAGNOSIS — N5201 Erectile dysfunction due to arterial insufficiency: Secondary | ICD-10-CM | POA: Diagnosis not present

## 2020-05-22 DIAGNOSIS — Z85828 Personal history of other malignant neoplasm of skin: Secondary | ICD-10-CM | POA: Diagnosis not present

## 2020-05-22 DIAGNOSIS — L570 Actinic keratosis: Secondary | ICD-10-CM | POA: Diagnosis not present

## 2020-05-22 DIAGNOSIS — L814 Other melanin hyperpigmentation: Secondary | ICD-10-CM | POA: Diagnosis not present

## 2020-05-22 DIAGNOSIS — D3617 Benign neoplasm of peripheral nerves and autonomic nervous system of trunk, unspecified: Secondary | ICD-10-CM | POA: Diagnosis not present

## 2020-06-10 DIAGNOSIS — I10 Essential (primary) hypertension: Secondary | ICD-10-CM | POA: Diagnosis not present

## 2020-06-10 DIAGNOSIS — Z8673 Personal history of transient ischemic attack (TIA), and cerebral infarction without residual deficits: Secondary | ICD-10-CM | POA: Diagnosis not present

## 2020-06-10 DIAGNOSIS — K219 Gastro-esophageal reflux disease without esophagitis: Secondary | ICD-10-CM | POA: Diagnosis not present

## 2020-06-10 DIAGNOSIS — C61 Malignant neoplasm of prostate: Secondary | ICD-10-CM | POA: Diagnosis not present

## 2020-06-10 DIAGNOSIS — Z1389 Encounter for screening for other disorder: Secondary | ICD-10-CM | POA: Diagnosis not present

## 2020-06-10 DIAGNOSIS — M2042 Other hammer toe(s) (acquired), left foot: Secondary | ICD-10-CM | POA: Diagnosis not present

## 2020-06-10 DIAGNOSIS — E7800 Pure hypercholesterolemia, unspecified: Secondary | ICD-10-CM | POA: Diagnosis not present

## 2020-06-10 DIAGNOSIS — R739 Hyperglycemia, unspecified: Secondary | ICD-10-CM | POA: Diagnosis not present

## 2020-06-10 DIAGNOSIS — Z0000 Encounter for general adult medical examination without abnormal findings: Secondary | ICD-10-CM | POA: Diagnosis not present

## 2020-07-11 DIAGNOSIS — H25813 Combined forms of age-related cataract, bilateral: Secondary | ICD-10-CM | POA: Diagnosis not present

## 2020-07-11 DIAGNOSIS — S0511XS Contusion of eyeball and orbital tissues, right eye, sequela: Secondary | ICD-10-CM | POA: Diagnosis not present

## 2020-07-11 DIAGNOSIS — H21341 Primary cyst of pars plana, right eye: Secondary | ICD-10-CM | POA: Diagnosis not present

## 2020-07-11 DIAGNOSIS — H353111 Nonexudative age-related macular degeneration, right eye, early dry stage: Secondary | ICD-10-CM | POA: Diagnosis not present

## 2020-07-11 DIAGNOSIS — H35363 Drusen (degenerative) of macula, bilateral: Secondary | ICD-10-CM | POA: Diagnosis not present

## 2020-07-11 DIAGNOSIS — S058X1S Other injuries of right eye and orbit, sequela: Secondary | ICD-10-CM | POA: Diagnosis not present

## 2020-07-11 DIAGNOSIS — H3581 Retinal edema: Secondary | ICD-10-CM | POA: Diagnosis not present

## 2020-08-13 DIAGNOSIS — Z23 Encounter for immunization: Secondary | ICD-10-CM | POA: Diagnosis not present

## 2020-12-15 DIAGNOSIS — D225 Melanocytic nevi of trunk: Secondary | ICD-10-CM | POA: Diagnosis not present

## 2020-12-15 DIAGNOSIS — D2272 Melanocytic nevi of left lower limb, including hip: Secondary | ICD-10-CM | POA: Diagnosis not present

## 2020-12-15 DIAGNOSIS — D0461 Carcinoma in situ of skin of right upper limb, including shoulder: Secondary | ICD-10-CM | POA: Diagnosis not present

## 2020-12-15 DIAGNOSIS — D045 Carcinoma in situ of skin of trunk: Secondary | ICD-10-CM | POA: Diagnosis not present

## 2020-12-15 DIAGNOSIS — L570 Actinic keratosis: Secondary | ICD-10-CM | POA: Diagnosis not present

## 2021-01-19 DIAGNOSIS — H35363 Drusen (degenerative) of macula, bilateral: Secondary | ICD-10-CM | POA: Diagnosis not present

## 2021-01-19 DIAGNOSIS — H353131 Nonexudative age-related macular degeneration, bilateral, early dry stage: Secondary | ICD-10-CM | POA: Diagnosis not present

## 2021-01-19 DIAGNOSIS — H43811 Vitreous degeneration, right eye: Secondary | ICD-10-CM | POA: Diagnosis not present

## 2021-01-19 DIAGNOSIS — H25813 Combined forms of age-related cataract, bilateral: Secondary | ICD-10-CM | POA: Diagnosis not present

## 2021-01-19 DIAGNOSIS — H43391 Other vitreous opacities, right eye: Secondary | ICD-10-CM | POA: Diagnosis not present

## 2021-03-12 DIAGNOSIS — Z8546 Personal history of malignant neoplasm of prostate: Secondary | ICD-10-CM | POA: Diagnosis not present

## 2021-03-23 DIAGNOSIS — N5201 Erectile dysfunction due to arterial insufficiency: Secondary | ICD-10-CM | POA: Diagnosis not present

## 2021-03-23 DIAGNOSIS — Z8546 Personal history of malignant neoplasm of prostate: Secondary | ICD-10-CM | POA: Diagnosis not present

## 2021-04-06 DIAGNOSIS — H0014 Chalazion left upper eyelid: Secondary | ICD-10-CM | POA: Diagnosis not present

## 2021-04-06 DIAGNOSIS — H0100B Unspecified blepharitis left eye, upper and lower eyelids: Secondary | ICD-10-CM | POA: Diagnosis not present

## 2021-04-06 DIAGNOSIS — H5712 Ocular pain, left eye: Secondary | ICD-10-CM | POA: Diagnosis not present

## 2021-06-15 DIAGNOSIS — I788 Other diseases of capillaries: Secondary | ICD-10-CM | POA: Diagnosis not present

## 2021-06-15 DIAGNOSIS — L814 Other melanin hyperpigmentation: Secondary | ICD-10-CM | POA: Diagnosis not present

## 2021-06-15 DIAGNOSIS — Z85828 Personal history of other malignant neoplasm of skin: Secondary | ICD-10-CM | POA: Diagnosis not present

## 2021-06-15 DIAGNOSIS — D3612 Benign neoplasm of peripheral nerves and autonomic nervous system, upper limb, including shoulder: Secondary | ICD-10-CM | POA: Diagnosis not present

## 2021-06-15 DIAGNOSIS — L570 Actinic keratosis: Secondary | ICD-10-CM | POA: Diagnosis not present

## 2021-06-15 DIAGNOSIS — L578 Other skin changes due to chronic exposure to nonionizing radiation: Secondary | ICD-10-CM | POA: Diagnosis not present

## 2021-06-29 DIAGNOSIS — I10 Essential (primary) hypertension: Secondary | ICD-10-CM | POA: Diagnosis not present

## 2021-06-29 DIAGNOSIS — C61 Malignant neoplasm of prostate: Secondary | ICD-10-CM | POA: Diagnosis not present

## 2021-06-29 DIAGNOSIS — Z0000 Encounter for general adult medical examination without abnormal findings: Secondary | ICD-10-CM | POA: Diagnosis not present

## 2021-06-29 DIAGNOSIS — E7800 Pure hypercholesterolemia, unspecified: Secondary | ICD-10-CM | POA: Diagnosis not present

## 2021-06-29 DIAGNOSIS — K219 Gastro-esophageal reflux disease without esophagitis: Secondary | ICD-10-CM | POA: Diagnosis not present

## 2021-06-29 DIAGNOSIS — R739 Hyperglycemia, unspecified: Secondary | ICD-10-CM | POA: Diagnosis not present

## 2021-06-29 DIAGNOSIS — Z1389 Encounter for screening for other disorder: Secondary | ICD-10-CM | POA: Diagnosis not present

## 2021-06-29 DIAGNOSIS — M2042 Other hammer toe(s) (acquired), left foot: Secondary | ICD-10-CM | POA: Diagnosis not present

## 2021-06-29 DIAGNOSIS — D696 Thrombocytopenia, unspecified: Secondary | ICD-10-CM | POA: Diagnosis not present

## 2021-07-14 DIAGNOSIS — E162 Hypoglycemia, unspecified: Secondary | ICD-10-CM | POA: Diagnosis not present

## 2021-08-17 DIAGNOSIS — H25813 Combined forms of age-related cataract, bilateral: Secondary | ICD-10-CM | POA: Diagnosis not present

## 2021-08-17 DIAGNOSIS — H35363 Drusen (degenerative) of macula, bilateral: Secondary | ICD-10-CM | POA: Diagnosis not present

## 2021-08-17 DIAGNOSIS — H353131 Nonexudative age-related macular degeneration, bilateral, early dry stage: Secondary | ICD-10-CM | POA: Diagnosis not present

## 2021-08-17 DIAGNOSIS — H43811 Vitreous degeneration, right eye: Secondary | ICD-10-CM | POA: Diagnosis not present

## 2021-08-17 DIAGNOSIS — H43391 Other vitreous opacities, right eye: Secondary | ICD-10-CM | POA: Diagnosis not present

## 2021-09-03 DIAGNOSIS — Z23 Encounter for immunization: Secondary | ICD-10-CM | POA: Diagnosis not present

## 2021-09-28 DIAGNOSIS — Z8546 Personal history of malignant neoplasm of prostate: Secondary | ICD-10-CM | POA: Diagnosis not present

## 2021-09-28 DIAGNOSIS — N5201 Erectile dysfunction due to arterial insufficiency: Secondary | ICD-10-CM | POA: Diagnosis not present

## 2021-09-28 DIAGNOSIS — R350 Frequency of micturition: Secondary | ICD-10-CM | POA: Diagnosis not present

## 2021-09-28 DIAGNOSIS — N401 Enlarged prostate with lower urinary tract symptoms: Secondary | ICD-10-CM | POA: Diagnosis not present

## 2022-02-15 DIAGNOSIS — H25813 Combined forms of age-related cataract, bilateral: Secondary | ICD-10-CM | POA: Diagnosis not present

## 2022-02-15 DIAGNOSIS — H353131 Nonexudative age-related macular degeneration, bilateral, early dry stage: Secondary | ICD-10-CM | POA: Diagnosis not present

## 2022-02-15 DIAGNOSIS — H35363 Drusen (degenerative) of macula, bilateral: Secondary | ICD-10-CM | POA: Diagnosis not present

## 2022-02-15 DIAGNOSIS — H35371 Puckering of macula, right eye: Secondary | ICD-10-CM | POA: Diagnosis not present

## 2022-02-15 DIAGNOSIS — H43811 Vitreous degeneration, right eye: Secondary | ICD-10-CM | POA: Diagnosis not present

## 2022-06-15 DIAGNOSIS — L570 Actinic keratosis: Secondary | ICD-10-CM | POA: Diagnosis not present

## 2022-06-15 DIAGNOSIS — D0461 Carcinoma in situ of skin of right upper limb, including shoulder: Secondary | ICD-10-CM | POA: Diagnosis not present

## 2022-06-15 DIAGNOSIS — Z85828 Personal history of other malignant neoplasm of skin: Secondary | ICD-10-CM | POA: Diagnosis not present

## 2022-06-15 DIAGNOSIS — B353 Tinea pedis: Secondary | ICD-10-CM | POA: Diagnosis not present

## 2022-06-15 DIAGNOSIS — D1801 Hemangioma of skin and subcutaneous tissue: Secondary | ICD-10-CM | POA: Diagnosis not present

## 2022-06-15 DIAGNOSIS — D3617 Benign neoplasm of peripheral nerves and autonomic nervous system of trunk, unspecified: Secondary | ICD-10-CM | POA: Diagnosis not present

## 2022-06-15 DIAGNOSIS — L820 Inflamed seborrheic keratosis: Secondary | ICD-10-CM | POA: Diagnosis not present

## 2022-06-15 DIAGNOSIS — D225 Melanocytic nevi of trunk: Secondary | ICD-10-CM | POA: Diagnosis not present

## 2022-06-15 DIAGNOSIS — L905 Scar conditions and fibrosis of skin: Secondary | ICD-10-CM | POA: Diagnosis not present

## 2022-06-15 DIAGNOSIS — D485 Neoplasm of uncertain behavior of skin: Secondary | ICD-10-CM | POA: Diagnosis not present

## 2022-08-11 DIAGNOSIS — Z8673 Personal history of transient ischemic attack (TIA), and cerebral infarction without residual deficits: Secondary | ICD-10-CM | POA: Diagnosis not present

## 2022-08-11 DIAGNOSIS — E7800 Pure hypercholesterolemia, unspecified: Secondary | ICD-10-CM | POA: Diagnosis not present

## 2022-08-11 DIAGNOSIS — I10 Essential (primary) hypertension: Secondary | ICD-10-CM | POA: Diagnosis not present

## 2022-08-11 DIAGNOSIS — Z23 Encounter for immunization: Secondary | ICD-10-CM | POA: Diagnosis not present

## 2022-08-11 DIAGNOSIS — M2042 Other hammer toe(s) (acquired), left foot: Secondary | ICD-10-CM | POA: Diagnosis not present

## 2022-08-11 DIAGNOSIS — Z0000 Encounter for general adult medical examination without abnormal findings: Secondary | ICD-10-CM | POA: Diagnosis not present

## 2022-08-11 DIAGNOSIS — Z1331 Encounter for screening for depression: Secondary | ICD-10-CM | POA: Diagnosis not present

## 2022-08-11 DIAGNOSIS — R079 Chest pain, unspecified: Secondary | ICD-10-CM | POA: Diagnosis not present

## 2022-08-11 DIAGNOSIS — K219 Gastro-esophageal reflux disease without esophagitis: Secondary | ICD-10-CM | POA: Diagnosis not present

## 2022-08-12 ENCOUNTER — Ambulatory Visit: Payer: Medicare HMO | Attending: Cardiology | Admitting: Cardiology

## 2022-08-12 ENCOUNTER — Encounter: Payer: Self-pay | Admitting: Cardiology

## 2022-08-12 VITALS — BP 118/78 | HR 77 | Ht 71.0 in | Wt 200.0 lb

## 2022-08-12 DIAGNOSIS — R072 Precordial pain: Secondary | ICD-10-CM | POA: Diagnosis not present

## 2022-08-12 DIAGNOSIS — I200 Unstable angina: Secondary | ICD-10-CM

## 2022-08-12 DIAGNOSIS — Z01812 Encounter for preprocedural laboratory examination: Secondary | ICD-10-CM

## 2022-08-12 LAB — CBC
Hematocrit: 40.9 % (ref 37.5–51.0)
Hemoglobin: 14.1 g/dL (ref 13.0–17.7)
MCH: 31 pg (ref 26.6–33.0)
MCHC: 34.5 g/dL (ref 31.5–35.7)
MCV: 90 fL (ref 79–97)
Platelets: 165 10*3/uL (ref 150–450)
RBC: 4.55 x10E6/uL (ref 4.14–5.80)
RDW: 13.8 % (ref 11.6–15.4)
WBC: 6.1 10*3/uL (ref 3.4–10.8)

## 2022-08-12 LAB — BASIC METABOLIC PANEL
BUN/Creatinine Ratio: 33 — ABNORMAL HIGH (ref 10–24)
BUN: 27 mg/dL (ref 8–27)
CO2: 25 mmol/L (ref 20–29)
Calcium: 9.7 mg/dL (ref 8.6–10.2)
Chloride: 106 mmol/L (ref 96–106)
Creatinine, Ser: 0.81 mg/dL (ref 0.76–1.27)
Glucose: 110 mg/dL — ABNORMAL HIGH (ref 70–99)
Potassium: 4.4 mmol/L (ref 3.5–5.2)
Sodium: 140 mmol/L (ref 134–144)
eGFR: 91 mL/min/{1.73_m2} (ref >59)

## 2022-08-12 NOTE — Progress Notes (Signed)
Cardiology Office Note:    Date:  08/12/2022   ID:  Randy Myers, DOB Apr 07, 1946, MRN [id number]  PCP:  Pcp, No   [facility] Providers Cardiologist:  None     Referring MD: [name], MD   History of Present Illness:    Randy Myers is a 76 y.o. male here for the evaluation of chest pain at the request of [name], MD.  He was seen by Dr. [name] on 08/11/2022 and reported episodic chest pressure associated with shortness of breath, nausea, and vomiting after walking short distances. It was also noted that he had a prior fall in 10/2019 resulting in multiple rib fractures and ongoing residual chest pain. He was referred to cardiology to rule out angina due to ASCVD. It was also noted he had hypertension and hyperlipidemia; at home his blood pressures were 90-118/50-60s on lisinopril 5 mg. LDL on 06/2021 was 65 on 10 mg atorvastatin.  Prior CVA 06/2014, followed by Dr. Leonie Man in neurology. Residual symptoms included inability to detect hot or cold on the right side of his body. He had been maintained on Plavix 75 mg to help prevent recurrent strokes.  Prior prostate cancer s/p radiation treatment 08/2017. He is on hormonal therapy for ongoing treatment.  Today, he is accompanied by his wife, [name]. He complains of chest pain and shortness of breath that has significantly worsened in the last 2 weeks.   He mentions the episodes of chest pain and shortness of breath are intermittent: he has good days and bad days. His chest pain can sometimes become so severe that it causes retching. His latest episode was last Monday where he was unable to sleep due to the pain.   Sometimes while walking to take out the trash he has trouble making it back to his house due to the shortness of breath. He noted that walking from the parking lot to the office today was enough to cause him to be short of breath.   In addition, he complains of intermittent LE edema.  His wife notes that  he tends to want to stay active with hard work outside although she encourages him to slow down and rest.    He has no history of smoking.  He denies any palpitations. No lightheadedness, headaches, syncope, orthopnea, or PND.  In his family, his father died of a possible heart attack; he also had prostate cancer.   Past Medical History:  Diagnosis Date   Arthritis    Coronary artery disease    Hypertension    Migraine    Prostate cancer ([place])    Prostate cancer ([place])    with radiation   Stroke ([place]) 06/10/2013   Stroke [facility])     Past Surgical History:  Procedure Laterality Date   BACK SURGERY  1983   BACK SURGERY     EYE SURGERY     PROSTATE BIOPSY      Current Medications: Current Meds  Medication Sig   acetaminophen (TYLENOL) 650 MG CR tablet Take 650 mg by mouth every 8 (eight) hours as needed for pain.   atorvastatin (LIPITOR) 10 MG tablet Take 10 mg by mouth daily after supper.   clopidogrel (PLAVIX) 75 MG tablet Take 75 mg by mouth daily after supper.   famotidine (PEPCID) 40 MG tablet Take 40 mg by mouth daily after supper.   lisinopril (ZESTRIL) 5 MG tablet Take 5 mg by mouth daily after supper.   Multiple Vitamin (MULTIVITAMIN WITH MINERALS)  TABS tablet Take 1 tablet by mouth daily with breakfast.   omeprazole (PRILOSEC) 40 MG capsule Take 40 mg by mouth daily with breakfast.    prednisoLONE acetate (PRED FORTE) 1 % ophthalmic suspension Place 1 drop into the right eye 4 (four) times daily.   [DISCONTINUED] ketoconazole (NIZORAL) 2 % cream Apply 1 application topically daily.   [DISCONTINUED] oxyCODONE (OXY IR/ROXICODONE) 5 MG immediate release tablet Take 1 tablet (5 mg total) by mouth every 6 (six) hours as needed for breakthrough pain.     Allergies:   Patient has no known allergies.   Social History   Socioeconomic History   Marital status: Married    Spouse name: [name]   Number of children: 1   Years of education: HS   Highest education level:  Not on file  Occupational History   Occupation: Retired  Tobacco Use   Smoking status: Never   Smokeless tobacco: Never  Vaping Use   Vaping Use: Never used  Substance and Sexual Activity   Alcohol use: Never   Drug use: Never   Sexual activity: Not on file  Other Topics Concern   Not on file  Social History Narrative   ** Merged History Encounter **       Patient is married with one child. Patient is left handed. Patient has high school education. Caffeine Use: 2 cups day   Social Determinants of Health   Financial Resource Strain: Not on file  Food Insecurity: Not on file  Transportation Needs: Not on file  Physical Activity: Not on file  Stress: Not on file  Social Connections: Not on file     Family History: The patient's family history includes Brain cancer in his sister; CAD in his father; Cancer in his sister; Heart disease in his father.  ROS:   Please see the history of present illness.    (+) Chest pain  (+) Shortness of breath (+) Emesis (+) LE edema All other systems reviewed and are negative.  EKGs/Labs/Other Studies Reviewed:    The following studies were reviewed today:  Chest CT 09/14/2019: FINDINGS: CT CHEST FINDINGS   Cardiovascular: Heart is normal size. Coronary artery and aortic calcifications. Aorta is slightly aneurysmal at 4.1 cm within the ascending thoracic aorta. No dissection or evidence of aortic injury.   Mediastinum/Nodes: No mediastinal, hilar, or axillary adenopathy.   Lungs/Pleura: Moderate-sized left pneumothorax. Small left pleural effusion. Airspace opacity noted in the left lower lobe, likely a combination of atelectasis and contusion. Dependent atelectasis in the right lung.   Musculoskeletal: Fracture through the lateral left 4th through 9th ribs and the anterior 3rd through 6th ribs on the left. Fracture through the posterior left 1st, 9th through 11th ribs and the left T9 and T10 transverse processes.    IMPRESSION: Multiple left rib fractures including the lateral 4th through 9th ribs, the anterior 3rd through 6th ribs, and the posterior left 1st, 9th through 11th ribs. Fractures through the left T9 and T10 transverse processes. Associated moderate left pneumothorax and small left pleural effusion.   Likely a combination of atelectasis and contusion in the left lower lobe.   Fractures through the left L3 and L4 transverse processes.   Large hematoma within the left flank subcutaneous soft tissues extending into the lateral left pelvis. There is also likely intramuscular hematoma and involving the left oblique muscles.   Large stool burden in the colon.   Nephrolithiasis.   Aortic atherosclerosis.   EKG:  EKG is personally reviewed  and interpreted. 08/12/2022: Sinus rhythm. Rate 73 bpm. Nonspecific ST changes. Inferior infarct pattern and poor R wave progression    Recent Labs: No results found for requested labs within last 365 days.   Recent Lipid Panel    Component Value Date/Time   CHOL 151 06/11/2013 0345   TRIG 71 06/11/2013 0345   HDL 38 (L) 06/11/2013 0345   CHOLHDL 4.0 06/11/2013 0345   VLDL 14 06/11/2013 0345   LDLCALC 99 06/11/2013 0345     Risk Assessment/Calculations:          Physical Exam:    VS:  BP 118/78   Pulse 77   Ht '5\' 11"'$  (1.803 m)   Wt 200 lb (90.7 kg)   SpO2 96%   BMI 27.89 kg/m     Wt Readings from Last 3 Encounters:  08/12/22 200 lb (90.7 kg)  09/14/19 191 lb (86.6 kg)  04/27/19 215 lb (97.5 kg)     GEN: Well nourished, well developed in no acute distress HEENT: Normal NECK: No JVD; No carotid bruits LYMPHATICS: No lymphadenopathy CARDIAC: RRR, no murmurs, rubs, gallops RESPIRATORY:  Clear to auscultation without rales, wheezing or rhonchi  ABDOMEN: Soft, non-tender, non-distended MUSCULOSKELETAL:  No edema; No deformity  SKIN: Warm and dry NEUROLOGIC:  Alert and oriented x 3 PSYCHIATRIC:  Normal affect    ASSESSMENT:    1. Unstable angina ([place])   2. Pre-procedure lab exam   3. Precordial pain    PLAN:    In order of problems listed above:  Unstable angina - Symptoms of progressively worsening angina with chest pain with exertion and shortness of breath sometimes resulting in nausea and vomiting.  EKG does demonstrate poor R wave progression as well as inferior infarct pattern and subtle ST elevation in the early precordial leads.  I think it makes sense to go ahead and proceed with cardiac catheterization, possible percutaneous intervention.  Worried about the possibility of severe triple-vessel disease.  We will progressively continue with goal-directed medical therapy.  Shared Decision Making/Informed Consent The risks [stroke (1 in 1000), death (1 in 1000), kidney failure [usually temporary] (1 in 500), bleeding (1 in 200), allergic reaction [possibly serious] (1 in 200)], benefits (diagnostic support and management of coronary artery disease) and alternatives of a cardiac catheterization were discussed in detail with Mr. Joslyn and he is willing to proceed.   Follow-up: 1 month with APP.  Medication Adjustments/Labs and Tests Ordered: Current medicines are reviewed at length with the patient today.  Concerns regarding medicines are outlined above.   Orders Placed This Encounter  Procedures   CBC   Basic metabolic panel   EKG 69-CVEL   No orders of the defined types were placed in this encounter.  Patient Instructions  Medication Instructions:  The current medical regimen is effective;  continue present plan and medications.  *If you need a refill on your cardiac medications before your next appointment, please call your pharmacy*  Lab Work: Please have blood work today (CBC, BMP) If you have labs (blood work) drawn today and your tests are completely normal, you will receive your results only by: MyChart Message (if you have [place]) OR A paper copy in the mail If you  have any lab test that is abnormal or we need to change your treatment, we will call you to review the results.   Testing/Procedures:   [facility] [place] [facility]  Shirlean Kelly [place], [place] [id number] [place] [address] Dept: [phone] Loc: [place]  08/12/2022  You are scheduled for a Cardiac Catheterization on Thursday, October 19 with Dr. Lauree Chandler.  1. Please arrive at the [facility] (Main Entrance A) at [facility]: [address] [place], [place] [address] at 5:30 AM (two hours before your procedure to ensure your preparation). Free valet parking service is available.   Special note: Every effort is made to have your procedure done on time. Please understand that emergencies sometimes delay scheduled procedures.  2. Diet: Do not eat or drink anything after midnight prior to your procedure except sips of water to take medications.  3. Labs: Today  4. Medication instructions in preparation for your procedure:  On the morning of your procedure, take your Plavix/Clopidogrel and any morning medicines NOT listed above.  You may use sips of water.  5. Plan for one night stay--bring personal belongings. 6. Bring a current list of your medications and current insurance cards. 7. You MUST have a responsible person to drive you home. 8. Someone MUST be with you the first 24 hours after you arrive home or your discharge will be delayed. 9. Please wear clothes that are easy to get on and off and wear slip-on shoes.  Thank you for allowing [place] to care for you!   -- [facility]  Follow-Up: At [facility], you and your health needs are our priority.  As part of our continuing mission to provide you with exceptional heart care, we have created designated Provider Care Teams.  These Care  Teams include your primary Cardiologist (physician) and Advanced Practice Providers (APPs -  Physician Assistants and Nurse Practitioners) who all work together to provide you with the care you need, when you need it.  We recommend signing up for the patient portal called "MyChart".  Sign up information is provided on this After Visit Summary.  MyChart is used to connect with patients for Virtual Visits (Telemedicine).  Patients are able to view lab/test results, encounter notes, upcoming appointments, etc.  Non-urgent messages can be sent to your provider as well.   To learn more about what you can do with MyChart, go to [email or web address].    Your next appointment:   1 month(s)  The format for your next appointment:   In Person  Provider:   Nicholes Rough, PA-C, Ermalinda Barrios, PA-C, Christen Bame, NP, or Richardson Dopp, PA-C          Important Information About [place]         I,Rachel Rivera,acting as a scribe for Candee Furbish, MD.,have documented all relevant documentation on the behalf of Candee Furbish, MD,as directed by  Candee Furbish, MD while in the presence of Candee Furbish, MD.  I, Candee Furbish, MD, have reviewed all documentation for this visit. The documentation on 08/12/22 for the exam, diagnosis, procedures, and orders are all accurate and complete.   Signed, Candee Furbish, MD  08/12/2022 3:48 PM    [place]

## 2022-08-12 NOTE — Patient Instructions (Signed)
Medication Instructions:  The current medical regimen is effective;  continue present plan and medications.  *If you need a refill on your cardiac medications before your next appointment, please call your pharmacy*  Lab Work: Please have blood work today (CBC, BMP) If you have labs (blood work) drawn today and your tests are completely normal, you will receive your results only by: MyChart Message (if you have Box) OR A paper copy in the mail If you have any lab test that is abnormal or we need to change your treatment, we will call you to review the results.   Testing/Procedures:   [place] A DEPT OF [place] A DEPT OF [facility] [place], [place] [id number] [place] [address] Dept: [phone] Loc: [place]  08/12/2022  You are scheduled for a Cardiac Catheterization on Thursday, October 19 with Dr. Lauree Chandler.  1. Please arrive at the [facility] (Main Entrance A) at [facility]: [address] [place], [place] [address] at 5:30 AM (two hours before your procedure to ensure your preparation). Free valet parking service is available.   Special note: Every effort is made to have your procedure done on time. Please understand that emergencies sometimes delay scheduled procedures.  2. Diet: Do not eat or drink anything after midnight prior to your procedure except sips of water to take medications.  3. Labs: Today  4. Medication instructions in preparation for your procedure:  On the morning of your procedure, take your Plavix/Clopidogrel and any morning medicines NOT listed above.  You may use sips of water.  5. Plan for one night stay--bring personal belongings. 6. Bring a current list of your medications and current insurance cards. 7. You MUST have a responsible person to drive you home. 8. Someone MUST be with you the first  24 hours after you arrive home or your discharge will be delayed. 9. Please wear clothes that are easy to get on and off and wear slip-on shoes.  Thank you for allowing [place] to care for you!   -- [place] [facility]  Follow-Up: At [facility], you and your health needs are our priority.  As part of our continuing mission to provide you with exceptional heart care, we have created designated Provider Care Teams.  These Care Teams include your primary Cardiologist (physician) and Advanced Practice Providers (APPs -  Physician Assistants and Nurse Practitioners) who all work together to provide you with the care you need, when you need it.  We recommend signing up for the patient portal called "MyChart".  Sign up information is provided on this After Visit Summary.  MyChart is used to connect with patients for Virtual Visits (Telemedicine).  Patients are able to view lab/test results, encounter notes, upcoming appointments, etc.  Non-urgent messages can be sent to your provider as well.   To learn more about what you can do with MyChart, go to [email or web address].    Your next appointment:   1 month(s)  The format for your next appointment:   In Person  Provider:   Nicholes Rough, PA-C, Ermalinda Barrios, PA-C, Christen Bame, NP, or Richardson Dopp, PA-C          Important Information About Sugar

## 2022-08-12 NOTE — H&P (View-Only) (Signed)
Cardiology Office Note:    Date:  08/12/2022   ID:  Randy Myers, DOB 1946-05-09, MRN [id number]  PCP:  Pcp, No   [facility] Providers Cardiologist:  None     Referring MD: [name], MD   History of Present Illness:    Randy Myers is a 76 y.o. male here for the evaluation of chest pain at the request of [name], MD.  He was seen by Dr. [name] on 08/11/2022 and reported episodic chest pressure associated with shortness of breath, nausea, and vomiting after walking short distances. It was also noted that he had a prior fall in 10/2019 resulting in multiple rib fractures and ongoing residual chest pain. He was referred to cardiology to rule out angina due to ASCVD. It was also noted he had hypertension and hyperlipidemia; at home his blood pressures were 90-118/50-60s on lisinopril 5 mg. LDL on 06/2021 was 65 on 10 mg atorvastatin.  Prior CVA 06/2014, followed by Dr. Leonie Man in neurology. Residual symptoms included inability to detect hot or cold on the right side of his body. He had been maintained on Plavix 75 mg to help prevent recurrent strokes.  Prior prostate cancer s/p radiation treatment 08/2017. He is on hormonal therapy for ongoing treatment.  Today, he is accompanied by his wife, [name]. He complains of chest pain and shortness of breath that has significantly worsened in the last 2 weeks.   He mentions the episodes of chest pain and shortness of breath are intermittent: he has good days and bad days. His chest pain can sometimes become so severe that it causes retching. His latest episode was last Monday where he was unable to sleep due to the pain.   Sometimes while walking to take out the trash he has trouble making it back to his house due to the shortness of breath. He noted that walking from the parking lot to the office today was enough to cause him to be short of breath.   In addition, he complains of intermittent LE edema.  His wife notes that  he tends to want to stay active with hard work outside although she encourages him to slow down and rest.    He has no history of smoking.  He denies any palpitations. No lightheadedness, headaches, syncope, orthopnea, or PND.  In his family, his father died of a possible heart attack; he also had prostate cancer.   Past Medical History:  Diagnosis Date   Arthritis    Coronary artery disease    Hypertension    Migraine    Prostate cancer ([place])    Prostate cancer ([place])    with radiation   Stroke ([place]) 06/10/2013   Stroke [facility])     Past Surgical History:  Procedure Laterality Date   BACK SURGERY  1983   BACK SURGERY     EYE SURGERY     PROSTATE BIOPSY      Current Medications: Current Meds  Medication Sig   acetaminophen (TYLENOL) 650 MG CR tablet Take 650 mg by mouth every 8 (eight) hours as needed for pain.   atorvastatin (LIPITOR) 10 MG tablet Take 10 mg by mouth daily after supper.   clopidogrel (PLAVIX) 75 MG tablet Take 75 mg by mouth daily after supper.   famotidine (PEPCID) 40 MG tablet Take 40 mg by mouth daily after supper.   lisinopril (ZESTRIL) 5 MG tablet Take 5 mg by mouth daily after supper.   Multiple Vitamin (MULTIVITAMIN WITH MINERALS)  TABS tablet Take 1 tablet by mouth daily with breakfast.   omeprazole (PRILOSEC) 40 MG capsule Take 40 mg by mouth daily with breakfast.    prednisoLONE acetate (PRED FORTE) 1 % ophthalmic suspension Place 1 drop into the right eye 4 (four) times daily.   [DISCONTINUED] ketoconazole (NIZORAL) 2 % cream Apply 1 application topically daily.   [DISCONTINUED] oxyCODONE (OXY IR/ROXICODONE) 5 MG immediate release tablet Take 1 tablet (5 mg total) by mouth every 6 (six) hours as needed for breakthrough pain.     Allergies:   Patient has no known allergies.   Social History   Socioeconomic History   Marital status: Married    Spouse name: [name]   Number of children: 1   Years of education: HS   Highest education level:  Not on file  Occupational History   Occupation: Retired  Tobacco Use   Smoking status: Never   Smokeless tobacco: Never  Vaping Use   Vaping Use: Never used  Substance and Sexual Activity   Alcohol use: Never   Drug use: Never   Sexual activity: Not on file  Other Topics Concern   Not on file  Social History Narrative   ** Merged History Encounter **       Patient is married with one child. Patient is left handed. Patient has high school education. Caffeine Use: 2 cups day   Social Determinants of Health   Financial Resource Strain: Not on file  Food Insecurity: Not on file  Transportation Needs: Not on file  Physical Activity: Not on file  Stress: Not on file  Social Connections: Not on file     Family History: The patient's family history includes Brain cancer in his sister; CAD in his father; Cancer in his sister; Heart disease in his father.  ROS:   Please see the history of present illness.    (+) Chest pain  (+) Shortness of breath (+) Emesis (+) LE edema All other systems reviewed and are negative.  EKGs/Labs/Other Studies Reviewed:    The following studies were reviewed today:  Chest CT 09/14/2019: FINDINGS: CT CHEST FINDINGS   Cardiovascular: Heart is normal size. Coronary artery and aortic calcifications. Aorta is slightly aneurysmal at 4.1 cm within the ascending thoracic aorta. No dissection or evidence of aortic injury.   Mediastinum/Nodes: No mediastinal, hilar, or axillary adenopathy.   Lungs/Pleura: Moderate-sized left pneumothorax. Small left pleural effusion. Airspace opacity noted in the left lower lobe, likely a combination of atelectasis and contusion. Dependent atelectasis in the right lung.   Musculoskeletal: Fracture through the lateral left 4th through 9th ribs and the anterior 3rd through 6th ribs on the left. Fracture through the posterior left 1st, 9th through 11th ribs and the left T9 and T10 transverse processes.    IMPRESSION: Multiple left rib fractures including the lateral 4th through 9th ribs, the anterior 3rd through 6th ribs, and the posterior left 1st, 9th through 11th ribs. Fractures through the left T9 and T10 transverse processes. Associated moderate left pneumothorax and small left pleural effusion.   Likely a combination of atelectasis and contusion in the left lower lobe.   Fractures through the left L3 and L4 transverse processes.   Large hematoma within the left flank subcutaneous soft tissues extending into the lateral left pelvis. There is also likely intramuscular hematoma and involving the left oblique muscles.   Large stool burden in the colon.   Nephrolithiasis.   Aortic atherosclerosis.   EKG:  EKG is personally reviewed  and interpreted. 08/12/2022: Sinus rhythm. Rate 73 bpm. Nonspecific ST changes. Inferior infarct pattern and poor R wave progression    Recent Labs: No results found for requested labs within last 365 days.   Recent Lipid Panel    Component Value Date/Time   CHOL 151 06/11/2013 0345   TRIG 71 06/11/2013 0345   HDL 38 (L) 06/11/2013 0345   CHOLHDL 4.0 06/11/2013 0345   VLDL 14 06/11/2013 0345   LDLCALC 99 06/11/2013 0345     Risk Assessment/Calculations:          Physical Exam:    VS:  BP 118/78   Pulse 77   Ht '5\' 11"'$  (1.803 m)   Wt 200 lb (90.7 kg)   SpO2 96%   BMI 27.89 kg/m     Wt Readings from Last 3 Encounters:  08/12/22 200 lb (90.7 kg)  09/14/19 191 lb (86.6 kg)  04/27/19 215 lb (97.5 kg)     GEN: Well nourished, well developed in no acute distress HEENT: Normal NECK: No JVD; No carotid bruits LYMPHATICS: No lymphadenopathy CARDIAC: RRR, no murmurs, rubs, gallops RESPIRATORY:  Clear to auscultation without rales, wheezing or rhonchi  ABDOMEN: Soft, non-tender, non-distended MUSCULOSKELETAL:  No edema; No deformity  SKIN: Warm and dry NEUROLOGIC:  Alert and oriented x 3 PSYCHIATRIC:  Normal affect    ASSESSMENT:    1. Unstable angina ([place])   2. Pre-procedure lab exam   3. Precordial pain    PLAN:    In order of problems listed above:  Unstable angina - Symptoms of progressively worsening angina with chest pain with exertion and shortness of breath sometimes resulting in nausea and vomiting.  EKG does demonstrate poor R wave progression as well as inferior infarct pattern and subtle ST elevation in the early precordial leads.  I think it makes sense to go ahead and proceed with cardiac catheterization, possible percutaneous intervention.  Worried about the possibility of severe triple-vessel disease.  We will progressively continue with goal-directed medical therapy.  Shared Decision Making/Informed Consent The risks [stroke (1 in 1000), death (1 in 1000), kidney failure [usually temporary] (1 in 500), bleeding (1 in 200), allergic reaction [possibly serious] (1 in 200)], benefits (diagnostic support and management of coronary artery disease) and alternatives of a cardiac catheterization were discussed in detail with Mr. Kennerly and he is willing to proceed.   Follow-up: 1 month with APP.  Medication Adjustments/Labs and Tests Ordered: Current medicines are reviewed at length with the patient today.  Concerns regarding medicines are outlined above.   Orders Placed This Encounter  Procedures   CBC   Basic metabolic panel   EKG [id number]   No orders of the defined types were placed in this encounter.  Patient Instructions  Medication Instructions:  The current medical regimen is effective;  continue present plan and medications.  *If you need a refill on your cardiac medications before your next appointment, please call your pharmacy*  Lab Work: Please have blood work today (CBC, BMP) If you have labs (blood work) drawn today and your tests are completely normal, you will receive your results only by: MyChart Message (if you have [place]) OR A paper copy in the mail If you  have any lab test that is abnormal or we need to change your treatment, we will call you to review the results.   Testing/Procedures:   [place] A DEPT OF [place] A DEPT OF [facility]  Shirlean Kelly [place], [place] [id number] [place] [address] Dept: [phone] Loc: [place]  08/12/2022  You are scheduled for a Cardiac Catheterization on Thursday, October 19 with Dr. Lauree Chandler.  1. Please arrive at the [facility] (Main Entrance A) at [facility]: [address] [place], [place] [address] at 5:30 AM (two hours before your procedure to ensure your preparation). Free valet parking service is available.   Special note: Every effort is made to have your procedure done on time. Please understand that emergencies sometimes delay scheduled procedures.  2. Diet: Do not eat or drink anything after midnight prior to your procedure except sips of water to take medications.  3. Labs: Today  4. Medication instructions in preparation for your procedure:  On the morning of your procedure, take your Plavix/Clopidogrel and any morning medicines NOT listed above.  You may use sips of water.  5. Plan for one night stay--bring personal belongings. 6. Bring a current list of your medications and current insurance cards. 7. You MUST have a responsible person to drive you home. 8. Someone MUST be with you the first 24 hours after you arrive home or your discharge will be delayed. 9. Please wear clothes that are easy to get on and off and wear slip-on shoes.  Thank you for allowing [place] to care for you!   -- [place] [facility]  Follow-Up: At [facility], you and your health needs are our priority.  As part of our continuing mission to provide you with exceptional heart care, we have created designated Provider Care Teams.  These Care  Teams include your primary Cardiologist (physician) and Advanced Practice Providers (APPs -  Physician Assistants and Nurse Practitioners) who all work together to provide you with the care you need, when you need it.  We recommend signing up for the patient portal called "MyChart".  Sign up information is provided on this After Visit Summary.  MyChart is used to connect with patients for Virtual Visits (Telemedicine).  Patients are able to view lab/test results, encounter notes, upcoming appointments, etc.  Non-urgent messages can be sent to your provider as well.   To learn more about what you can do with MyChart, go to [email or web address].    Your next appointment:   1 month(s)  The format for your next appointment:   In Person  Provider:   Nicholes Rough, PA-C, Ermalinda Barrios, PA-C, Christen Bame, NP, or Richardson Dopp, PA-C          Important Information About Sugar         I,Rachel Rivera,acting as a scribe for Candee Furbish, MD.,have documented all relevant documentation on the behalf of Candee Furbish, MD,as directed by  Candee Furbish, MD while in the presence of Candee Furbish, MD.  I, Candee Furbish, MD, have reviewed all documentation for this visit. The documentation on 08/12/22 for the exam, diagnosis, procedures, and orders are all accurate and complete.   Signed, Candee Furbish, MD  08/12/2022 3:48 PM    [place]

## 2022-08-17 ENCOUNTER — Telehealth: Payer: Self-pay | Admitting: *Deleted

## 2022-08-17 NOTE — Telephone Encounter (Signed)
Call placed to patient to review instructions, no answer, no voicemail.

## 2022-08-17 NOTE — Telephone Encounter (Signed)
Cardiac Catheterization scheduled at [facility] for: Thursday August 19, 2022 7:30 AM Arrival time and place: [place] Entrance A at: 5:30 AM  Nothing to eat after midnight prior to procedure, clear liquids until 5 AM day of procedure.  Medication instructions: -Usual morning medications can be taken with sips of water including aspirin 81 mg and Plavix 75 mg  Confirmed patient has responsible adult to drive home post procedure and be with patient first 24 hours after arriving home.  Patient reports no new symptoms concerning for COVID-19 in the past 10 days.   Call placed to patient to review procedure instructions, no answer, no voicemail.

## 2022-08-18 NOTE — Telephone Encounter (Signed)
Reviewed procedure instructions with patient.  

## 2022-08-19 ENCOUNTER — Encounter (HOSPITAL_COMMUNITY): Payer: Self-pay | Admitting: Cardiovascular Disease

## 2022-08-19 ENCOUNTER — Encounter (HOSPITAL_COMMUNITY)
Admission: AD | Disposition: A | Discharge status: Home / Self Care | Payer: Self-pay | Source: Home / Self Care | Attending: Thoracic Surgery (Cardiothoracic Vascular Surgery)

## 2022-08-19 ENCOUNTER — Inpatient Hospital Stay (HOSPITAL_COMMUNITY): Payer: Medicare HMO

## 2022-08-19 ENCOUNTER — Other Ambulatory Visit: Payer: Self-pay

## 2022-08-19 ENCOUNTER — Inpatient Hospital Stay (HOSPITAL_COMMUNITY)
Admission: AD | Admit: 2022-08-19 | Discharge: 2022-09-02 | DRG: 234 | Disposition: A | Discharge status: Home / Self Care | Payer: Medicare HMO | Attending: Thoracic Surgery (Cardiothoracic Vascular Surgery) | Admitting: Thoracic Surgery (Cardiothoracic Vascular Surgery)

## 2022-08-19 DIAGNOSIS — Z951 Presence of aortocoronary bypass graft: Secondary | ICD-10-CM | POA: Diagnosis not present

## 2022-08-19 DIAGNOSIS — I2510 Atherosclerotic heart disease of native coronary artery without angina pectoris: Secondary | ICD-10-CM | POA: Diagnosis not present

## 2022-08-19 DIAGNOSIS — I200 Unstable angina: Secondary | ICD-10-CM | POA: Diagnosis not present

## 2022-08-19 DIAGNOSIS — I340 Nonrheumatic mitral (valve) insufficiency: Secondary | ICD-10-CM | POA: Diagnosis not present

## 2022-08-19 DIAGNOSIS — D62 Acute posthemorrhagic anemia: Secondary | ICD-10-CM | POA: Diagnosis not present

## 2022-08-19 DIAGNOSIS — R000 Tachycardia, unspecified: Secondary | ICD-10-CM | POA: Diagnosis not present

## 2022-08-19 DIAGNOSIS — D6959 Other secondary thrombocytopenia: Secondary | ICD-10-CM | POA: Diagnosis not present

## 2022-08-19 DIAGNOSIS — M1990 Unspecified osteoarthritis, unspecified site: Secondary | ICD-10-CM | POA: Diagnosis present

## 2022-08-19 DIAGNOSIS — I480 Paroxysmal atrial fibrillation: Secondary | ICD-10-CM

## 2022-08-19 DIAGNOSIS — Z7902 Long term (current) use of antithrombotics/antiplatelets: Secondary | ICD-10-CM

## 2022-08-19 DIAGNOSIS — Z8249 Family history of ischemic heart disease and other diseases of the circulatory system: Secondary | ICD-10-CM

## 2022-08-19 DIAGNOSIS — I080 Rheumatic disorders of both mitral and aortic valves: Secondary | ICD-10-CM | POA: Diagnosis present

## 2022-08-19 DIAGNOSIS — Z01818 Encounter for other preprocedural examination: Secondary | ICD-10-CM | POA: Diagnosis not present

## 2022-08-19 DIAGNOSIS — E119 Type 2 diabetes mellitus without complications: Secondary | ICD-10-CM | POA: Diagnosis not present

## 2022-08-19 DIAGNOSIS — E876 Hypokalemia: Secondary | ICD-10-CM | POA: Diagnosis present

## 2022-08-19 DIAGNOSIS — Z6828 Body mass index (BMI) 28.0-28.9, adult: Secondary | ICD-10-CM

## 2022-08-19 DIAGNOSIS — Z79899 Other long term (current) drug therapy: Secondary | ICD-10-CM | POA: Diagnosis not present

## 2022-08-19 DIAGNOSIS — G43909 Migraine, unspecified, not intractable, without status migrainosus: Secondary | ICD-10-CM | POA: Diagnosis present

## 2022-08-19 DIAGNOSIS — I255 Ischemic cardiomyopathy: Secondary | ICD-10-CM | POA: Diagnosis present

## 2022-08-19 DIAGNOSIS — I4819 Other persistent atrial fibrillation: Secondary | ICD-10-CM | POA: Diagnosis present

## 2022-08-19 DIAGNOSIS — E785 Hyperlipidemia, unspecified: Secondary | ICD-10-CM | POA: Diagnosis present

## 2022-08-19 DIAGNOSIS — I5041 Acute combined systolic (congestive) and diastolic (congestive) heart failure: Secondary | ICD-10-CM | POA: Diagnosis present

## 2022-08-19 DIAGNOSIS — G463 Brain stem stroke syndrome: Secondary | ICD-10-CM | POA: Diagnosis not present

## 2022-08-19 DIAGNOSIS — Z8546 Personal history of malignant neoplasm of prostate: Secondary | ICD-10-CM

## 2022-08-19 DIAGNOSIS — I69398 Other sequelae of cerebral infarction: Secondary | ICD-10-CM

## 2022-08-19 DIAGNOSIS — E669 Obesity, unspecified: Secondary | ICD-10-CM | POA: Diagnosis present

## 2022-08-19 DIAGNOSIS — I10 Essential (primary) hypertension: Secondary | ICD-10-CM | POA: Diagnosis present

## 2022-08-19 DIAGNOSIS — I5043 Acute on chronic combined systolic (congestive) and diastolic (congestive) heart failure: Secondary | ICD-10-CM | POA: Diagnosis present

## 2022-08-19 DIAGNOSIS — E7841 Elevated Lipoprotein(a): Secondary | ICD-10-CM | POA: Diagnosis present

## 2022-08-19 DIAGNOSIS — Z20822 Contact with and (suspected) exposure to covid-19: Secondary | ICD-10-CM | POA: Diagnosis not present

## 2022-08-19 DIAGNOSIS — I493 Ventricular premature depolarization: Secondary | ICD-10-CM | POA: Diagnosis present

## 2022-08-19 DIAGNOSIS — I25110 Atherosclerotic heart disease of native coronary artery with unstable angina pectoris: Principal | ICD-10-CM | POA: Diagnosis present

## 2022-08-19 DIAGNOSIS — Z923 Personal history of irradiation: Secondary | ICD-10-CM

## 2022-08-19 DIAGNOSIS — Z01812 Encounter for preprocedural laboratory examination: Secondary | ICD-10-CM

## 2022-08-19 DIAGNOSIS — R0789 Other chest pain: Secondary | ICD-10-CM | POA: Diagnosis present

## 2022-08-19 DIAGNOSIS — R072 Precordial pain: Secondary | ICD-10-CM

## 2022-08-19 DIAGNOSIS — Z4682 Encounter for fitting and adjustment of non-vascular catheter: Secondary | ICD-10-CM | POA: Diagnosis not present

## 2022-08-19 DIAGNOSIS — Z8673 Personal history of transient ischemic attack (TIA), and cerebral infarction without residual deficits: Secondary | ICD-10-CM | POA: Diagnosis not present

## 2022-08-19 DIAGNOSIS — Z634 Disappearance and death of family member: Secondary | ICD-10-CM

## 2022-08-19 DIAGNOSIS — Z01810 Encounter for preprocedural cardiovascular examination: Secondary | ICD-10-CM | POA: Diagnosis not present

## 2022-08-19 DIAGNOSIS — I4891 Unspecified atrial fibrillation: Secondary | ICD-10-CM | POA: Diagnosis not present

## 2022-08-19 DIAGNOSIS — I110 Hypertensive heart disease with heart failure: Secondary | ICD-10-CM | POA: Diagnosis present

## 2022-08-19 DIAGNOSIS — I2582 Chronic total occlusion of coronary artery: Secondary | ICD-10-CM | POA: Diagnosis not present

## 2022-08-19 DIAGNOSIS — I9789 Other postprocedural complications and disorders of the circulatory system, not elsewhere classified: Secondary | ICD-10-CM | POA: Diagnosis not present

## 2022-08-19 DIAGNOSIS — J9811 Atelectasis: Secondary | ICD-10-CM | POA: Diagnosis not present

## 2022-08-19 DIAGNOSIS — I25119 Atherosclerotic heart disease of native coronary artery with unspecified angina pectoris: Secondary | ICD-10-CM | POA: Diagnosis not present

## 2022-08-19 HISTORY — PX: LEFT HEART CATH AND CORONARY ANGIOGRAPHY: CATH118249

## 2022-08-19 LAB — BASIC METABOLIC PANEL
Anion gap: 7 (ref 5–15)
BUN: 18 mg/dL (ref 8–23)
CO2: 25 mmol/L (ref 22–32)
Calcium: 8.9 mg/dL (ref 8.9–10.3)
Chloride: 108 mmol/L (ref 98–111)
Creatinine, Ser: 0.86 mg/dL (ref 0.61–1.24)
GFR, Estimated: >60 mL/min (ref >60)
Glucose, Bld: 105 mg/dL — ABNORMAL HIGH (ref 70–99)
Potassium: 3.7 mmol/L (ref 3.5–5.1)
Sodium: 140 mmol/L (ref 135–145)

## 2022-08-19 LAB — ECHOCARDIOGRAM COMPLETE
AR max vel: 2.11 cm2
AV Area VTI: 2.44 cm2
AV Area mean vel: 2.03 cm2
AV Mean grad: 4 mmHg
AV Peak grad: 7.4 mmHg
Ao pk vel: 1.36 m/s
Area-P 1/2: 2.57 cm2
Height: 71 in
S' Lateral: 3.8 cm
Single Plane A4C EF: 46.9 %
Weight: 3120 oz

## 2022-08-19 SURGERY — LEFT HEART CATH AND CORONARY ANGIOGRAPHY
Anesthesia: LOCAL

## 2022-08-19 MED ORDER — IOHEXOL 350 MG/ML SOLN
INTRAVENOUS | Status: DC | PRN
  Administered 2022-08-19: 80 mL

## 2022-08-19 MED ORDER — VERAPAMIL HCL 2.5 MG/ML IV SOLN
INTRAVENOUS | Status: AC
  Filled 2022-08-19: qty 2

## 2022-08-19 MED ORDER — SODIUM CHLORIDE 0.9 % IV SOLN: 250.0000 mL | INTRAVENOUS | Status: DC | PRN

## 2022-08-19 MED ORDER — OXYCODONE HCL 5 MG PO TABS: 5.0000 mg | ORAL_TABLET | ORAL | Status: DC | PRN

## 2022-08-19 MED ORDER — HEPARIN (PORCINE) IN NACL 1000-0.9 UT/500ML-% IV SOLN
INTRAVENOUS | Status: DC | PRN
  Administered 2022-08-19 (×2): 500 mL

## 2022-08-19 MED ORDER — SODIUM CHLORIDE 0.9 % WEIGHT BASED INFUSION
3.0000 mL/kg/h | INTRAVENOUS | Status: DC
  Administered 2022-08-19: 3 mL/kg/h via INTRAVENOUS

## 2022-08-19 MED ORDER — ADULT MULTIVITAMIN W/MINERALS CH
1.0000 | ORAL_TABLET | Freq: Every day | ORAL | Status: DC
  Administered 2022-08-20 – 2022-08-23 (×4): 1 via ORAL
  Filled 2022-08-19 (×5): qty 1

## 2022-08-19 MED ORDER — HEPARIN SODIUM (PORCINE) 1000 UNIT/ML IJ SOLN
INTRAMUSCULAR | Status: AC
  Filled 2022-08-19: qty 10

## 2022-08-19 MED ORDER — LIDOCAINE HCL (PF) 1 % IJ SOLN
INTRAMUSCULAR | Status: AC
  Filled 2022-08-19: qty 30

## 2022-08-19 MED ORDER — PERFLUTREN LIPID MICROSPHERE
1.0000 mL | INTRAVENOUS | Status: AC | PRN
  Administered 2022-08-19: 2 mL via INTRAVENOUS

## 2022-08-19 MED ORDER — ONDANSETRON HCL 4 MG/2ML IJ SOLN: 4.0000 mg | Freq: Four times a day (QID) | INTRAMUSCULAR | Status: DC | PRN

## 2022-08-19 MED ORDER — ENOXAPARIN SODIUM 40 MG/0.4ML IJ SOSY: 40.0000 mg | PREFILLED_SYRINGE | INTRAMUSCULAR | Status: DC

## 2022-08-19 MED ORDER — HEPARIN SODIUM (PORCINE) 1000 UNIT/ML IJ SOLN
INTRAMUSCULAR | Status: DC | PRN
  Administered 2022-08-19: 4500 [IU] via INTRAVENOUS

## 2022-08-19 MED ORDER — ASPIRIN 81 MG PO CHEW
81.0000 mg | CHEWABLE_TABLET | Freq: Every day | ORAL | Status: DC
  Administered 2022-08-20 – 2022-08-24 (×5): 81 mg via ORAL
  Filled 2022-08-19 (×6): qty 1

## 2022-08-19 MED ORDER — HEPARIN (PORCINE) 25000 UT/250ML-% IV SOLN
1550.0000 [IU]/h | INTRAVENOUS | Status: DC
  Administered 2022-08-19: 1100 [IU]/h via INTRAVENOUS
  Administered 2022-08-20 – 2022-08-22 (×4): 1450 [IU]/h via INTRAVENOUS
  Administered 2022-08-23 – 2022-08-24 (×2): 1550 [IU]/h via INTRAVENOUS
  Filled 2022-08-19 (×7): qty 250

## 2022-08-19 MED ORDER — ACETAMINOPHEN 325 MG PO TABS: 650.0000 mg | ORAL_TABLET | ORAL | Status: DC | PRN

## 2022-08-19 MED ORDER — SODIUM CHLORIDE 0.9% FLUSH: 3.0000 mL | INTRAVENOUS | Status: DC | PRN

## 2022-08-19 MED ORDER — FENTANYL CITRATE (PF) 100 MCG/2ML IJ SOLN
INTRAMUSCULAR | Status: DC | PRN
  Administered 2022-08-19: 25 ug via INTRAVENOUS

## 2022-08-19 MED ORDER — SODIUM CHLORIDE 0.9% FLUSH: 3.0000 mL | Freq: Two times a day (BID) | INTRAVENOUS | Status: DC

## 2022-08-19 MED ORDER — HYDRALAZINE HCL 20 MG/ML IJ SOLN: 10.0000 mg | INTRAMUSCULAR | Status: AC | PRN

## 2022-08-19 MED ORDER — LISINOPRIL 5 MG PO TABS
5.0000 mg | ORAL_TABLET | Freq: Every day | ORAL | Status: DC
  Filled 2022-08-19: qty 1

## 2022-08-19 MED ORDER — MIDAZOLAM HCL 2 MG/2ML IJ SOLN
INTRAMUSCULAR | Status: AC
  Filled 2022-08-19: qty 2

## 2022-08-19 MED ORDER — SODIUM CHLORIDE 0.9 % WEIGHT BASED INFUSION: 1.0000 mL/kg/h | INTRAVENOUS | Status: DC

## 2022-08-19 MED ORDER — HEPARIN (PORCINE) IN NACL 1000-0.9 UT/500ML-% IV SOLN
INTRAVENOUS | Status: AC
  Filled 2022-08-19: qty 1000

## 2022-08-19 MED ORDER — SODIUM CHLORIDE 0.9% FLUSH
3.0000 mL | INTRAVENOUS | Status: DC | PRN
  Administered 2022-08-19: 3 mL via INTRAVENOUS

## 2022-08-19 MED ORDER — MIDAZOLAM HCL 2 MG/2ML IJ SOLN
INTRAMUSCULAR | Status: DC | PRN
  Administered 2022-08-19: 1 mg via INTRAVENOUS

## 2022-08-19 MED ORDER — PANTOPRAZOLE SODIUM 40 MG PO TBEC
40.0000 mg | DELAYED_RELEASE_TABLET | Freq: Every day | ORAL | Status: DC
  Administered 2022-08-20 – 2022-08-23 (×4): 40 mg via ORAL
  Filled 2022-08-19 (×6): qty 1

## 2022-08-19 MED ORDER — LABETALOL HCL 5 MG/ML IV SOLN: 10.0000 mg | INTRAVENOUS | Status: AC | PRN

## 2022-08-19 MED ORDER — ASPIRIN 81 MG PO CHEW: 81.0000 mg | CHEWABLE_TABLET | ORAL | Status: DC

## 2022-08-19 MED ORDER — LIDOCAINE HCL (PF) 1 % IJ SOLN
INTRAMUSCULAR | Status: DC | PRN
  Administered 2022-08-19: 2 mL via INTRADERMAL

## 2022-08-19 MED ORDER — SODIUM CHLORIDE 0.9 % IV SOLN: INTRAVENOUS | Status: AC

## 2022-08-19 MED ORDER — SODIUM CHLORIDE 0.9% FLUSH
3.0000 mL | Freq: Two times a day (BID) | INTRAVENOUS | Status: DC
  Administered 2022-08-19 – 2022-08-24 (×8): 3 mL via INTRAVENOUS

## 2022-08-19 MED ORDER — VERAPAMIL HCL 2.5 MG/ML IV SOLN
INTRAVENOUS | Status: DC | PRN
  Administered 2022-08-19: 10 mL via INTRA_ARTERIAL

## 2022-08-19 MED ORDER — ATORVASTATIN CALCIUM 80 MG PO TABS
80.0000 mg | ORAL_TABLET | Freq: Every day | ORAL | Status: DC
  Administered 2022-08-20 – 2022-09-02 (×13): 80 mg via ORAL
  Filled 2022-08-19 (×15): qty 1

## 2022-08-19 MED ORDER — FENTANYL CITRATE (PF) 100 MCG/2ML IJ SOLN
INTRAMUSCULAR | Status: AC
  Filled 2022-08-19: qty 2

## 2022-08-19 SURGICAL SUPPLY — 11 items
BAND CMPR LRG ZPHR (HEMOSTASIS) ×1
BAND ZEPHYR COMPRESS 30 LONG (HEMOSTASIS) IMPLANT
CATH 5FR JL3.5 JR4 ANG PIG MP (CATHETERS) IMPLANT
GLIDESHEATH SLEND SS 6F .021 (SHEATH) IMPLANT
GUIDEWIRE INQWIRE 1.5J.035X260 (WIRE) IMPLANT
INQWIRE 1.5J .035X260CM (WIRE) ×1
KIT HEART LEFT (KITS) ×1 IMPLANT
PACK CARDIAC CATHETERIZATION (CUSTOM PROCEDURE TRAY) ×1 IMPLANT
TRANSDUCER W/STOPCOCK (MISCELLANEOUS) ×1 IMPLANT
TUBING CIL FLEX 10 FLL-RA (TUBING) ×1 IMPLANT
WIRE HI TORQ VERSACORE-J 145CM (WIRE) IMPLANT

## 2022-08-19 NOTE — Progress Notes (Signed)
ANTICOAGULATION CONSULT NOTE - Initial Consult  Pharmacy Consult for heparin Indication: chest pain/ACS  No Known Allergies  Patient Measurements: Height: '5\' 11"'$  (180.3 cm) Weight: 88.5 kg (195 lb) IBW/kg (Calculated) : 75.3 Heparin Dosing Weight: 89kg  Vital Signs: Temp: 98.1 F (36.7 C) (10/19 0555) Temp Source: Temporal (10/19 0555) BP: 128/65 (10/19 1305) Pulse Rate: 77 (10/19 1305)  Labs: No results for input(s): "HGB", "HCT", "PLT", "APTT", "LABPROT", "INR", "HEPARINUNFRC", "HEPRLOWMOCWT", "CREATININE", "CKTOTAL", "CKMB", "TROPONINIHS" in the last 72 hours.  Estimated Creatinine Clearance: 82.6 mL/min (by C-G formula based on SCr of 0.81 mg/dL).   Medical History: Past Medical History:  Diagnosis Date   Arthritis    Coronary artery disease    Hypertension    Migraine    Prostate cancer [facility])    Prostate cancer [facility])    with radiation   Stroke ([place]) 06/10/2013   Stroke [facility])     Assessment: 76 year old male admitted after elective cath found multivessel CAD, patient awaiting surgical input. Patient does not appear to be on anticoagulation prior to admit, however he did take plavix this morning.    Goal of Therapy:  Heparin level 0.3-0.7 units/ml Monitor platelets by anticoagulation protocol: Yes   Plan:  Start heparin infusion at 1100 units/hr Check anti-Xa level in 8 hours and daily while on heparin Continue to monitor H&H and platelets  Erin Hearing PharmD., BCPS Clinical Pharmacist 08/19/2022 2:28 PM

## 2022-08-19 NOTE — Consult Note (Addendum)
[address]       [place] [address]             [phone]        Randy Myers [place] Medical Record #[id number] Date of Birth: [birth year]  Referring: Angelena Form Primary Care: [name], MD Primary Cardiologist: Marlou Porch  Chief Complaint:  CAD  History of Present Illness:      Randy Myers is a 76 yo obese male with history of CVA in 2015 on Plavix, Prostate Cancer S/P Radiation 2018 and continued hormonal treatment, Fall from 2020 with subsequent rib fractures, HTN, and HLD.  The patient presented to his Primary care physician [name] on 08/11/2022 with complaints of episodic chest pressure with associated shortness of breath, nausea and vomiting after walking short distances.  Due to symptoms he was referred to Cardiology for evaluation.  He saw Dr. Marlou Porch on 10/12 at which time he admitted to chest pain and shortness of breath that had been progressing over the past 2 weeks.  These episodes occur intermittently but at times is so severe he experiences retching.  He has also been experiencing intermittent LE edema.  He denied palpitations, lightheadedness, syncope, or orthopnea.  EKG was obtained and showed NSR with non-specific ST changes with inferior infarct pattern and poor R wave progression.  It was recommended the patient undergo cardiac catheterization with possible PCI.  The patient was agreeable to proceed.  He presented to [facility] on 08/19/2022 for elective cardiac catheterization.  This showed a significantly reduced EF of 25-35% and multivessel CAD.  It was felt the patient would best be treated with revascularization and Cardiothoracic surgery consultation has been requested.  Echocardiogram has been ordered.  Currently the patient is chest pain free.  His last dose of Plavix was taken this morning.  He has never smoked.  Prior to admission he remained physically active.  He has no family history of CAD.  He would like to go home  prior to surgery to get some things taken care of.  I explained to the patient that this will be up to Cardiology.   Current Activity/ Functional Status: Patient is independent with mobility/ambulation, transfers, ADL's, IADL's.   Zubrod Score: At the time of surgery this patient's most appropriate activity status/level should be described as: '[]'$     0    Normal activity, no symptoms '[x]'$     1    Restricted in physical strenuous activity but ambulatory, able to do out light work '[]'$     2    Ambulatory and capable of self care, unable to do work activities, up and about                 more than 50%  Of the time                            '[]'$     3    Only limited self care, in bed greater than 50% of waking hours '[]'$     4    Completely disabled, no self care, confined to bed or chair '[]'$     5    Moribund  Past Medical History:  Diagnosis Date   Arthritis    Coronary artery disease    Hypertension    Migraine    Prostate cancer [facility])    Prostate cancer [facility])    with radiation   Stroke ([place]) 06/10/2013  Stroke [facility])     Past Surgical History:  Procedure Laterality Date   BACK SURGERY  1983   BACK SURGERY     EYE SURGERY     PROSTATE BIOPSY      Social History   Tobacco Use  Smoking Status Never  Smokeless Tobacco Never    Social History   Substance and Sexual Activity  Alcohol Use Never     No Known Allergies  Current Facility-Administered Medications  Medication Dose Route Frequency Provider Last Rate Last Admin   0.9 %  sodium chloride infusion  250 mL Intravenous PRN Jerline Pain, MD       0.9 %  sodium chloride infusion   Intravenous Continuous Burnell Blanks, MD 75 mL/hr at 08/19/22 0829 Rate Change at 08/19/22 0829   0.9% sodium chloride infusion  1 mL/kg/hr Intravenous Continuous Jerline Pain, MD 90.7 mL/hr at 08/19/22 0729 1 mL/kg/hr at 08/19/22 6967   aspirin chewable tablet 81 mg  81 mg Oral Pre-Cath Burnell Blanks, MD       atorvastatin  (LIPITOR) tablet 80 mg  80 mg Oral Daily Burnell Blanks, MD       fentaNYL (SUBLIMAZE) injection    PRN Burnell Blanks, MD   25 mcg at 08/19/22 0742   Heparin (Porcine) in NaCl 1000-0.9 UT/500ML-% SOLN    PRN Burnell Blanks, MD   500 mL at 08/19/22 0812   heparin sodium (porcine) injection    PRN Burnell Blanks, MD   4,500 Units at 08/19/22 0753   iohexol (OMNIPAQUE) 350 MG/ML injection    PRN Burnell Blanks, MD   80 mL at 08/19/22 0813   lidocaine (PF) (XYLOCAINE) 1 % injection    PRN Burnell Blanks, MD   2 mL at 08/19/22 0749   midazolam (VERSED) injection    PRN Burnell Blanks, MD   1 mg at 08/19/22 8938   multivitamin with minerals tablet 1 tablet  1 tablet Oral Q breakfast Burnell Blanks, MD       pantoprazole (PROTONIX) EC tablet 40 mg  40 mg Oral Daily Burnell Blanks, MD       Radial Cocktail/Verapamil only    PRN Burnell Blanks, MD   10 mL at 08/19/22 0749   sodium chloride flush (NS) 0.9 % injection 3 mL  3 mL Intravenous Q12H Jerline Pain, MD       sodium chloride flush (NS) 0.9 % injection 3 mL  3 mL Intravenous PRN Jerline Pain, MD        Medications Prior to Admission  Medication Sig Dispense Refill Last Dose   acetaminophen (TYLENOL) 650 MG CR tablet Take 650 mg by mouth every 8 (eight) hours as needed for pain.   08/18/2022   atorvastatin (LIPITOR) 10 MG tablet Take 10 mg by mouth daily after supper.   08/19/2022   clopidogrel (PLAVIX) 75 MG tablet Take 75 mg by mouth daily after supper.   08/19/2022 at 0430   famotidine (PEPCID) 40 MG tablet Take 40 mg by mouth daily after supper.   08/18/2022   lisinopril (ZESTRIL) 5 MG tablet Take 5 mg by mouth daily after supper.   08/19/2022   Multiple Vitamin (MULTIVITAMIN WITH MINERALS) TABS tablet Take 1 tablet by mouth daily with breakfast.   08/18/2022   omeprazole (PRILOSEC) 40 MG capsule Take 40 mg by mouth daily with breakfast.    08/18/2022     Family  History  Problem Relation Age of Onset   CAD Father    Heart disease Father    Brain cancer Sister    Cancer Sister        brain     Review of Systems:   ROS    Cardiac Review of Systems: Y or  [    ]= no  Chest Pain [ Y   ]  Resting SOB [   ] Exertional SOB  [ Y ]  Orthopnea [  ]   Pedal Edema [ N  ]    Palpitations [  ] Syncope  [  ]   Presyncope [   ]  General Review of Systems: [Y] = yes [  ]=no Constitional: recent weight change [  ]; anorexia [  ]; fatigue [ Y ]; nausea [  N]; night sweats [  ]; fever [  ]; or chills [  ]                                                               Dental: Last Dentist visit:   Eye : blurred vision [  ]; diplopia [   ]; vision changes [  ];  Amaurosis fugax[  ]; Resp: cough Aqua.Slicker  ];  wheezing[  ];  hemoptysis[  ]; shortness of breath[ Y ]; paroxysmal nocturnal dyspnea[  ]; dyspnea on exertion[ Y ]; or orthopnea[  ];  GI:  gallstones[  ], vomiting[  ];  dysphagia[  ]; melena[  ];  hematochezia [  ]; heartburn[  ];   Hx of  Colonoscopy[  ]; GU: kidney stones [  ]; hematuria[  ];   dysuria [  ];  nocturia[  ];  history of     obstruction [  ]; urinary frequency [  ]             Skin: rash, swelling[ N ];, hair loss[  ];  peripheral edema[ N ];  or itching[  ]; Musculosketetal: myalgias[  ];  joint swelling[  ];  joint erythema[  ];  joint pain[  ];  back pain[  ];  Heme/Lymph: bruising[  ];  bleeding[  ];  anemia[  ];  Neuro: TIA[  ];  headaches[  ];  stroke[Y, 2015 on Plavix  ];  vertigo[  ];  seizures[  ];   paresthesias[  ];  difficulty walking[  ];  Psych:depression[ N ]; anxiety[  ];  Endocrine: diabetes[ N ];  thyroid dysfunction[N  ]; Physical Exam: BP 122/63   Pulse 69   Temp 98.1 F (36.7 C) (Temporal)   Resp 18   Ht '5\' 11"'$  (1.803 m)   Wt 88.5 kg   SpO2 98%   BMI 27.20 kg/m   General appearance: alert, cooperative, and no distress Head: Normocephalic, without obvious abnormality, atraumatic Neck: no adenopathy, no  carotid bruit, no JVD, supple, symmetrical, trachea midline, and thyroid not enlarged, symmetric, no tenderness/mass/nodules Resp: clear to auscultation bilaterally Cardio: regular rate and rhythm GI: soft, non-tender; bowel sounds normal; no masses,  no organomegaly Extremities: extremities normal, atraumatic, no cyanosis or edema Neurologic: Grossly normal  Diagnostic Studies & Laboratory data:     Recent Radiology Findings:   CARDIAC CATHETERIZATION  Result Date: 08/19/2022   Ost RCA  to Prox RCA lesion is 40% stenosed.   Prox RCA to Mid RCA lesion is 100% stenosed.   Mid LM to Dist LM lesion is 40% stenosed.   Ost LAD to Prox LAD lesion is 40% stenosed.   Prox LAD to Mid LAD lesion is 99% stenosed.   2nd Mrg lesion is 70% stenosed.   Prox Cx to Mid Cx lesion is 70% stenosed.   There is moderate to severe left ventricular systolic dysfunction.   LV end diastolic pressure is normal.   The left ventricular ejection fraction is 25-35% by visual estimate. 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 The mid and distal vessel fills briskly from left to right collaterals. Severe segmental LV dysfunction with hypokinesis of the anterior wall. LVEDP 15 mmHg Recommendations: I think the best strategy for revascularization will be bypass surgery. The LAD stenosis is a long segment and there is almost complete disconnection through the lesion. This also involves a large septal that supplies collaterals to the occluded RCA. PCI of the LAD could potentially disrupt collateral flow. LV gram suggests at least moderate LV systolic dysfunction with severe hypokinesis of the anterior wall. Will admit to telemetry as he is having unstable symptoms at home. Will consult CT surgery for CABG. Will hold Plavix today and allow washout. Will start ASA and will  start IV heparin 6 hours post sheath pull given the severity of the LAD disease. High intensity statin.     I have independently reviewed the above radiologic studies and discussed with the patient   Recent Lab Findings: Lab Results  Component Value Date   WBC 6.1 08/12/2022   HGB 14.1 08/12/2022   HCT 40.9 08/12/2022   PLT 165 08/12/2022   GLUCOSE 110 (H) 08/12/2022   CHOL 151 06/11/2013   TRIG 71 06/11/2013   HDL 38 (L) 06/11/2013   LDLCALC 99 06/11/2013   ALT 16 09/14/2019   AST 39 09/14/2019   NA 140 08/12/2022   K 4.4 08/12/2022   CL 106 08/12/2022   CREATININE 0.81 08/12/2022   BUN 27 08/12/2022   CO2 25 08/12/2022   INR 1.2 09/15/2019   HGBA1C 5.8 (H) 09/15/2019      Assessment / Plan:      Multivessel CAD with unstable angina symptoms- requesting CABG consult HTN HLD Previous CVA in 2015 on Plavix- last dose was 10/19 H/O Prostate Cancer S/P Radiation, continued hormonal therapy  76 yo obese male with unstable angina.  Catheterization with 3 vessel disease, requesting CABG consult.  His last dose of Plavix was 10/19.  Overall he appears to be a reasonable candidate for surgery.  He will require Plavix washout prior to surgery.  He does wish to go home and that will be up to Cardiology.  Dr. Kipp Brood will evaluate patient and discuss surgery in further detail  and timing of surgery.  I  spent 40 minutes counseling the patient face to face.   Ellwood Handler, PA-C 08/19/2022 11:41 AM    76 year old male with three-vessel coronary artery disease, reduced left ventricular function with EF 30 to 35%.  There is no significant valvular disease, the right ventricle function is normal.  On review of his left heart catheterization he has good target on all walls.  The LAD fills late but appears intramyocardial.  The PDA fills from left  to right collaterals.  And he has a tight lesion on the obtuse marginal.  We discussed the risk and benefits of surgical revascularization  he is agreeable to proceed.  He will need a Plavix washout prior to surgery.  Keily Lepp Bary Leriche

## 2022-08-19 NOTE — Progress Notes (Signed)
  Echocardiogram 2D Echocardiogram has been performed.  Randy Myers 08/19/2022, 4:31 PM

## 2022-08-19 NOTE — Interval H&P Note (Signed)
History and Physical Interval Note:  08/19/2022 7:02 AM  Randy Myers  has presented today for surgery, with the diagnosis of chest pain.  The various methods of treatment have been discussed with the patient and family. After consideration of risks, benefits and other options for treatment, the patient has consented to  Procedure(s): LEFT HEART CATH AND CORONARY ANGIOGRAPHY (N/A) as a surgical intervention.  The patient's history has been reviewed, patient examined, no change in status, stable for surgery.  I have reviewed the patient's chart and labs.  Questions were answered to the patient's satisfaction.    Cath Lab Visit (complete for each Cath Lab visit)  Clinical Evaluation Leading to the Procedure:   ACS: No.  Non-ACS:    Anginal Classification: CCS III  Anti-ischemic medical therapy: No Therapy  Non-Invasive Test Results: No non-invasive testing performed  Prior CABG: No previous CABG        Lauree Chandler

## 2022-08-20 ENCOUNTER — Other Ambulatory Visit (HOSPITAL_COMMUNITY): Payer: Self-pay

## 2022-08-20 ENCOUNTER — Inpatient Hospital Stay (HOSPITAL_COMMUNITY): Payer: Medicare HMO

## 2022-08-20 ENCOUNTER — Telehealth (HOSPITAL_COMMUNITY): Payer: Self-pay

## 2022-08-20 ENCOUNTER — Encounter (HOSPITAL_COMMUNITY): Payer: Self-pay | Admitting: Cardiovascular Disease

## 2022-08-20 DIAGNOSIS — I5041 Acute combined systolic (congestive) and diastolic (congestive) heart failure: Secondary | ICD-10-CM | POA: Diagnosis present

## 2022-08-20 DIAGNOSIS — I10 Essential (primary) hypertension: Secondary | ICD-10-CM | POA: Diagnosis not present

## 2022-08-20 DIAGNOSIS — I255 Ischemic cardiomyopathy: Secondary | ICD-10-CM

## 2022-08-20 DIAGNOSIS — Z01810 Encounter for preprocedural cardiovascular examination: Secondary | ICD-10-CM

## 2022-08-20 DIAGNOSIS — I200 Unstable angina: Secondary | ICD-10-CM

## 2022-08-20 DIAGNOSIS — G463 Brain stem stroke syndrome: Secondary | ICD-10-CM

## 2022-08-20 DIAGNOSIS — E785 Hyperlipidemia, unspecified: Secondary | ICD-10-CM | POA: Diagnosis not present

## 2022-08-20 DIAGNOSIS — I2510 Atherosclerotic heart disease of native coronary artery without angina pectoris: Secondary | ICD-10-CM

## 2022-08-20 HISTORY — DX: Atherosclerotic heart disease of native coronary artery without angina pectoris: I25.10

## 2022-08-20 LAB — CBC
HCT: 41.6 % (ref 39.0–52.0)
Hemoglobin: 14.1 g/dL (ref 13.0–17.0)
MCH: 30.9 pg (ref 26.0–34.0)
MCHC: 33.9 g/dL (ref 30.0–36.0)
MCV: 91 fL (ref 80.0–100.0)
Platelets: 108 10*3/uL — ABNORMAL LOW (ref 150–400)
RBC: 4.57 MIL/uL (ref 4.22–5.81)
RDW: 13.2 % (ref 11.5–15.5)
WBC: 4.5 10*3/uL (ref 4.0–10.5)
nRBC: 0 % (ref 0.0–0.2)

## 2022-08-20 LAB — HEPARIN LEVEL (UNFRACTIONATED)
Heparin Unfractionated: 0.17 IU/mL — ABNORMAL LOW (ref 0.30–0.70)
Heparin Unfractionated: 0.2 IU/mL — ABNORMAL LOW (ref 0.30–0.70)
Heparin Unfractionated: 0.34 IU/mL (ref 0.30–0.70)

## 2022-08-20 LAB — HEMOGLOBIN A1C
Hgb A1c MFr Bld: 5.4 % (ref 4.8–5.6)
Mean Plasma Glucose: 108.28 mg/dL

## 2022-08-20 LAB — BASIC METABOLIC PANEL
Anion gap: 6 (ref 5–15)
BUN: 15 mg/dL (ref 8–23)
CO2: 25 mmol/L (ref 22–32)
Calcium: 8.8 mg/dL — ABNORMAL LOW (ref 8.9–10.3)
Chloride: 108 mmol/L (ref 98–111)
Creatinine, Ser: 0.86 mg/dL (ref 0.61–1.24)
GFR, Estimated: >60 mL/min (ref >60)
Glucose, Bld: 111 mg/dL — ABNORMAL HIGH (ref 70–99)
Potassium: 3.9 mmol/L (ref 3.5–5.1)
Sodium: 139 mmol/L (ref 135–145)

## 2022-08-20 LAB — LIPID PANEL
Cholesterol: 132 mg/dL (ref 0–200)
HDL: 44 mg/dL (ref >40)
LDL Cholesterol: 76 mg/dL (ref 0–99)
Total CHOL/HDL Ratio: 3 RATIO
Triglycerides: 62 mg/dL (ref <150)
VLDL: 12 mg/dL (ref 0–40)

## 2022-08-20 MED ORDER — METOPROLOL SUCCINATE ER 25 MG PO TB24
12.5000 mg | ORAL_TABLET | Freq: Every day | ORAL | Status: DC
  Administered 2022-08-20 – 2022-08-23 (×4): 12.5 mg via ORAL
  Filled 2022-08-20 (×4): qty 1

## 2022-08-20 MED ORDER — SPIRONOLACTONE 12.5 MG HALF TABLET
12.5000 mg | ORAL_TABLET | Freq: Every day | ORAL | Status: DC
  Administered 2022-08-20 – 2022-08-21 (×2): 12.5 mg via ORAL
  Filled 2022-08-20 (×2): qty 1

## 2022-08-20 NOTE — Progress Notes (Signed)
ANTICOAGULATION CONSULT NOTE   Pharmacy Consult for heparin Indication: chest pain/ACS, s/p cath, awaiting CABG  No Known Allergies  Patient Measurements: Height: '5\' 11"'$  (180.3 cm) Weight: 88.5 kg (195 lb) IBW/kg (Calculated) : 75.3 Heparin Dosing Weight: 89kg  Vital Signs: Temp: 97.8 F (36.6 C) (10/20 2015) Temp Source: Oral (10/20 2015) BP: 132/78 (10/20 2015) Pulse Rate: 63 (10/20 2015)  Labs: Recent Labs    08/19/22 1535 08/20/22 0300 08/20/22 1131 08/20/22 2227  HGB  --  14.1  --   --   HCT  --  41.6  --   --   PLT  --  108*  --   --   HEPARINUNFRC  --  0.17* 0.20* 0.34  CREATININE 0.86 0.86  --   --      Estimated Creatinine Clearance: 77.8 mL/min (by C-G formula based on SCr of 0.86 mg/dL).   Medical History: Past Medical History:  Diagnosis Date   Arthritis    Coronary artery disease    Hypertension    Migraine    Multiple vessel coronary artery disease 08/20/2022   Presented with unstable angina 08/19/2022: Cath -> ost-prox RCA 40%, prox-mid RCA 100% (~CTO w/ L-R collaterals); Ost-Prox LAD 40% prox-midLAD 99% (appears to be subtotally occluded at major SP branch.); Prox-mid LCx 70%, OM2 70%.  LVEF 25 to 35% (moderate to severely reduced function.  Severe HK of anterior wall 15 mmHg). ==> CABG, concern for PCI of the LAD could jeopardize large SP branch. => W   Prostate cancer [facility])    Prostate cancer [facility])    with radiation   Stroke ([place]) 06/10/2013   Stroke [facility])     Assessment: 76 year old male admitted after elective cath found multivessel CAD, patient awaiting surgical input. Patient does not appear to be on anticoagulation prior to admit, however he did take plavix this morning.    10/20 PM update:  Heparin level therapeutic Awaiting CABG  Goal of Therapy:  Heparin level 0.3-0.7 units/ml Monitor platelets by anticoagulation protocol: Yes   Plan:  Cont heparin at 1450 units/hr Heparin level with AM labs  Narda Bonds, PharmD,  [place] Pharmacist Phone: [phone]

## 2022-08-20 NOTE — Plan of Care (Signed)

## 2022-08-20 NOTE — TOC Benefit Eligibility Note (Signed)
Patient Teacher, English as a foreign language completed.    The patient is currently admitted and upon discharge could be taking Farxiga 10 mg.  The current 30 day co-pay is $95.00.   The patient is currently admitted and upon discharge could be taking Jardiance 10 mg.  The current 30 day co-pay is $45.00.  The patient is currently admitted and upon discharge could be taking Entresto 24-26 mg.  The current 30 day co-pay is $45.00.   The patient is insured through [place], [place] Patient Advocate Specialist [place] Patient Advocate Team Direct Number: [phone] Fax: [phone]

## 2022-08-20 NOTE — Progress Notes (Signed)
Heart Failure Navigator Progress Note  Following this hospitalization to assess for HV TOC readiness.   EF 30-35 % CABG 10/24  Earnestine Leys, BSN, RN Heart Failure Leisure centre manager Chat Only

## 2022-08-20 NOTE — Progress Notes (Addendum)
Rounding Note    Patient Name: Randy Myers Date of Encounter: 08/20/2022  [place] Cardiologist: None    Patient Profile     76 y.o. male with PMH of HTN, HLD, [place] who presented for outpatient cardiac cath and found to have multivessel disease.   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 Seen by TCTS and planned for CABG 10/24 after plavix washout.  -- remains on IV heparin, ASA, statin,  -- add low dose Toprol -- hold lisinopril (ideally transition to [facility]),  With ACS presentation, with like to discharge on aspirin/Plavix, however with him being on warfarin, would err on the side of monotherapy (SAPT)  HFrEF ICM -- LVEF of 30-35%, normal RV size and function, akinesis of distal anteroseptal and apical walls, mild LVH, g1DD -- add Toprol 12.'5mg'$  daily, stop lisinopril (ideally transition to [facility] prior to discharge),  add spiro 12.'5mg'$  daily  -- cost check for SGLT2 Concern for LV apical thrombus on echo.  Would consider reevaluation prior to discharge.   Is currently on IV heparin, would recommend initiation of warfarin prior to discharge.  HTN -- blood pressures stable -- stop lisinopril, add Toprol 12.'5mg'$  daily  -- add spiro 12.'5mg'$  daily   Possible early LV thrombus -- potential apical thrombus with using definity -- remains on IV heparin  HLD -- LDL 76, HDL 44, LP (a) pending -- on high dose statin If LP(a) is  elevated, would need to refer for PCSK9 inhibitors versus inclisiran.  Hx of Stroke -- has been on plavix and statin -- plavix now held for washout  =================================================================== Subjective   Feeling well this morning.   Inpatient Medications    Scheduled Meds:  aspirin  81 mg Oral Daily   atorvastatin  80 mg Oral Daily   metoprolol succinate  12.5 mg Oral Daily   multivitamin with minerals  1 tablet Oral Daily   pantoprazole  40 mg Oral Daily   sodium chloride flush  3 mL Intravenous Q12H   spironolactone  12.5 mg Oral Daily   Continuous Infusions:  sodium chloride     heparin 1,250 Units/hr (08/20/22 0349)   PRN Meds: sodium chloride, acetaminophen, ondansetron (ZOFRAN) IV, oxyCODONE, sodium chloride flush   Vital Signs    Vitals:   08/20/22 0017 08/20/22 0418 08/20/22 0828 08/20/22 1218  BP: 123/73 116/81 115/67   Pulse: 60 60 64 75  Resp: '17 16 16   '$ Temp: 97.8 F (36.6 C) 98 F (36.7 C) 97.8 F (36.6 C)   TempSrc: Oral Oral Oral   SpO2: 90% 100% 100%   Weight:      Height:        Intake/Output Summary (Last 24 hours) at 08/20/2022 1355 Last data filed at 08/20/2022 0903 Gross per 24 hour  Intake 567.58 ml  Output 850 ml  Net -282.42 ml      08/19/2022    5:55 AM 08/12/2022    2:43 PM 09/14/2019  8:55 PM  Last 3 Weights  Weight (lbs) 195 lb 200 lb 191 lb  Weight (kg) 88.451 kg 90.719 kg 86.637 kg      Telemetry    Sinus Rhythm - Personally Reviewed  ECG    No new tracing  Physical Exam   GEN: No acute distress.   Neck: No JVD Cardiac: RRR, no murmurs, rubs, or gallops.  Respiratory: Clear to auscultation bilaterally. GI: Soft, nontender, non-distended  MS: No edema; No deformity. Right radial cath site stable  Neuro:  Nonfocal  Psych: Normal affect   Labs    High Sensitivity Troponin:  No results for input(s): "TROPONINIHS" in the last 720 hours.   Chemistry Recent Labs  Lab  09-04-2022 1535 08/20/22 0300  NA 140 139  K 3.7 3.9  CL 108 108  CO2 25 25  GLUCOSE 105* 111*  BUN 18 15  CREATININE 0.86 0.86  CALCIUM 8.9 8.8*  GFRNONAA >60 >60  ANIONGAP 7 6    Lipids  Recent Labs  Lab 08/20/22 0300  CHOL 132  TRIG 62  HDL 44  LDLCALC 76  CHOLHDL 3.0    Hematology Recent Labs  Lab 08/20/22 0300  WBC 4.5  RBC 4.57  HGB 14.1  HCT 41.6  MCV 91.0  MCH 30.9  MCHC 33.9  RDW 13.2  PLT 108*   Thyroid No results for input(s): "TSH", "FREET4" in the last 168 hours.  BNPNo results for input(s): "BNP", "PROBNP" in the last 168 hours.  DDimer No results for input(s): "DDIMER" in the last 168 hours.   Radiology    No radiology studies  Cardiac Studies   Cath: 04-Sep-2022    Ost RCA to Prox RCA lesion is 40% stenosed.   Prox RCA to Mid RCA lesion is 100% stenosed.   Mid LM to Dist LM lesion is 40% stenosed.   Ost LAD to Prox LAD lesion is 40% stenosed.   Prox LAD to Mid LAD lesion is 99% stenosed.   2nd Mrg lesion is 70% stenosed.   Prox Cx to Mid Cx lesion is 70% stenosed.   There is moderate to severe left ventricular systolic dysfunction.   LV end diastolic pressure is normal.   The left ventricular ejection fraction is 25-35% by visual estimate.   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 The mid and distal vessel fills briskly from left to right collaterals.  Severe segmental LV dysfunction with hypokinesis of the anterior wall. LVEDP 15 mmHg   Recommendations: I think the best strategy for revascularization will be bypass surgery. The LAD stenosis is a long segment and there is almost complete disconnection through the lesion. This also involves a large septal that supplies collaterals to the occluded RCA. PCI of the LAD could potentially disrupt collateral flow. LV gram suggests  at least moderate LV systolic dysfunction with severe hypokinesis of the anterior wall. Will admit to telemetry as he is having unstable symptoms at home. Will consult CT surgery for CABG. Will hold Plavix today and allow washout. Will start ASA and will start IV heparin 6 hours post sheath pull given the severity of the LAD disease. High intensity statin.    Diagnostic  Dominance: Right   Echo: September 04, 2022: Moderate to severely reduced LV function with a EF of 30 to 35%.  Distal anteroseptal and apical wall  akinesis overall moderate to severe LV dysfunction.  Probable early apical thrombus noted using Definity.  Also Calcified aortic valve with partial fusion of noncoronary and left cusps but no AS by Doppler.  Mild concentric LVH with GR 1 DD.   For questions or updates, please contact [place] Please consult [email or web address] for contact info under        Signed, Reino Bellis, NP  08/20/2022, 9:25 AM     ATTENDING ATTESTATION  I have seen, examined and evaluated the patient this morning on rounds along with Reino Bellis, NP-C.  After reviewing all the available data and chart, we discussed the patients laboratory, study & physical findings as well as symptoms in detail.  I agree with her findings, examination as well as impression recommendations as per our discussion.    Attending adjustments noted in italics.     Leonie Man, MD, MS Glenetta Hew, M.D., M.S. Interventional Cardiologist  [place]  Pager # [phone] Phone # [phone] [address] [place],  [address]

## 2022-08-20 NOTE — Progress Notes (Signed)
ANTICOAGULATION CONSULT NOTE   Pharmacy Consult for heparin Indication:  s/p cath  No Known Allergies  Patient Measurements: Height: '5\' 11"'$  (180.3 cm) Weight: 88.5 kg (195 lb) IBW/kg (Calculated) : 75.3 Heparin Dosing Weight: 89kg  Vital Signs: Temp: 97.8 F (36.6 C) (10/20 0828) Temp Source: Oral (10/20 0828) BP: 115/67 (10/20 0828) Pulse Rate: 75 (10/20 1218)  Labs: Recent Labs    08/19/22 1535 08/20/22 0300 08/20/22 1131  HGB  --  14.1  --   HCT  --  41.6  --   PLT  --  108*  --   HEPARINUNFRC  --  0.17* 0.20*  CREATININE 0.86 0.86  --      Estimated Creatinine Clearance: 77.8 mL/min (by C-G formula based on SCr of 0.86 mg/dL).   Medical History: Past Medical History:  Diagnosis Date   Arthritis    Coronary artery disease    Hypertension    Migraine    Prostate cancer [facility])    Prostate cancer [facility])    with radiation   Stroke ([place]) 06/10/2013   Stroke [facility])     Assessment: 76 year old male admitted after elective cath found multivessel CAD and echo with probably apical thrombus. Pharmacy dosing heparin. Plans noted for CABG on 10/24 -heparin level= 0.2 on 1250 units/hr   Goal of Therapy:  Heparin level 0.3-0.7 units/ml Monitor platelets by anticoagulation protocol: Yes   Plan:  -Increase heparin to 1450 units/hr -Heparin level in 8 hours and daily wth CBC daily  Hildred Laser, PharmD Clinical Pharmacist **Pharmacist phone directory can now be found on [email or web address] ([email or web address]).  Listed under [place].

## 2022-08-20 NOTE — Progress Notes (Signed)
CARDIAC REHAB PHASE I      Pre OHS education including OHS booklet, OHS handout, IS use, sternal precautions, move in the tub, home needs at discharge, pain management and early ambulation reviewed with pt and wife. All questions and concerns addressed. Able to reach 1750 today with IS. Will continue to follow.   1410-1500  Vanessa Barbara, RN BSN 08/20/2022 2:49 PM

## 2022-08-20 NOTE — Progress Notes (Signed)
Pre-CABG Dopplers completed. Refer to "CV Proc" under chart review to view preliminary results.  08/20/2022 11:21 AM Kelby Aline., MHA, RVT, RDCS, RDMS

## 2022-08-20 NOTE — Telephone Encounter (Signed)
Pharmacy Patient Advocate Encounter  Insurance verification completed.    The patient is insured through [employer]   The patient is currently admitted and ran test claims for the following: Farxiga, Entresto, Jardiance.  Copays and coinsurance results were relayed to Inpatient clinical team.

## 2022-08-20 NOTE — Progress Notes (Signed)
ANTICOAGULATION CONSULT NOTE   Pharmacy Consult for heparin Indication: chest pain/ACS, s/p cath, pending CVTS consult  No Known Allergies  Patient Measurements: Height: '5\' 11"'$  (180.3 cm) Weight: 88.5 kg (195 lb) IBW/kg (Calculated) : 75.3 Heparin Dosing Weight: 89kg  Vital Signs: Temp: 97.8 F (36.6 C) (10/20 0017) Temp Source: Oral (10/20 0017) BP: 123/73 (10/20 0017) Pulse Rate: 60 (10/20 0017)  Labs: Recent Labs    08/19/22 1535 08/20/22 0300  HGB  --  14.1  HCT  --  41.6  PLT  --  108*  HEPARINUNFRC  --  0.17*  CREATININE 0.86  --     Estimated Creatinine Clearance: 77.8 mL/min (by C-G formula based on SCr of 0.86 mg/dL).   Medical History: Past Medical History:  Diagnosis Date   Arthritis    Coronary artery disease    Hypertension    Migraine    Prostate cancer [facility])    Prostate cancer [facility])    with radiation   Stroke ([place]) 06/10/2013   Stroke [facility])     Assessment: 76 year old male admitted after elective cath found multivessel CAD, patient awaiting surgical input. Patient does not appear to be on anticoagulation prior to admit, however he did take plavix this morning.    10/20 AM update:  Heparin level sub-therapeutic  Pending CVTS consult  Goal of Therapy:  Heparin level 0.3-0.7 units/ml Monitor platelets by anticoagulation protocol: Yes   Plan:  Inc heparin to 1250 units/hr 1200 heparin level  Narda Bonds, PharmD, BCPS Clinical Pharmacist Phone: [phone]

## 2022-08-20 NOTE — Progress Notes (Signed)
Mobility Specialist Progress Note:   08/20/22 0850  Mobility  Activity Ambulated with assistance in hallway  Level of Assistance Standby assist, set-up cues, supervision of patient - no hands on  Assistive Device None  Distance Ambulated (ft) 350 ft  Activity Response Tolerated well  Mobility Referral Yes  $Mobility charge 1 Mobility   Pt received in bed willing to participate in mobility. No complaints of pain. Left EOB with call bell in reach and all needs met.   [facility] Surveyor, mining Chat only

## 2022-08-21 DIAGNOSIS — I2510 Atherosclerotic heart disease of native coronary artery without angina pectoris: Secondary | ICD-10-CM | POA: Diagnosis not present

## 2022-08-21 DIAGNOSIS — I255 Ischemic cardiomyopathy: Secondary | ICD-10-CM | POA: Diagnosis not present

## 2022-08-21 DIAGNOSIS — I10 Essential (primary) hypertension: Secondary | ICD-10-CM | POA: Diagnosis not present

## 2022-08-21 DIAGNOSIS — E785 Hyperlipidemia, unspecified: Secondary | ICD-10-CM | POA: Diagnosis not present

## 2022-08-21 LAB — HEPARIN LEVEL (UNFRACTIONATED): Heparin Unfractionated: 0.36 IU/mL (ref 0.30–0.70)

## 2022-08-21 LAB — CBC
HCT: 41 % (ref 39.0–52.0)
Hemoglobin: 13.8 g/dL (ref 13.0–17.0)
MCH: 30.4 pg (ref 26.0–34.0)
MCHC: 33.7 g/dL (ref 30.0–36.0)
MCV: 90.3 fL (ref 80.0–100.0)
Platelets: 108 10*3/uL — ABNORMAL LOW (ref 150–400)
RBC: 4.54 MIL/uL (ref 4.22–5.81)
RDW: 13.1 % (ref 11.5–15.5)
WBC: 5.2 10*3/uL (ref 4.0–10.5)
nRBC: 0 % (ref 0.0–0.2)

## 2022-08-21 LAB — LIPOPROTEIN A (LPA): Lipoprotein (a): 212.7 nmol/L — ABNORMAL HIGH (ref <75.0)

## 2022-08-21 MED ORDER — SPIRONOLACTONE 25 MG PO TABS
25.0000 mg | ORAL_TABLET | Freq: Every day | ORAL | Status: DC
  Administered 2022-08-22 – 2022-08-23 (×2): 25 mg via ORAL
  Filled 2022-08-21 (×2): qty 1

## 2022-08-21 NOTE — Progress Notes (Signed)
ANTICOAGULATION CONSULT NOTE   Pharmacy Consult for heparin Indication: chest pain/ACS, s/p cath, awaiting CABG  No Known Allergies  Patient Measurements: Height: '5\' 11"'$  (180.3 cm) Weight: 88.5 kg (195 lb) IBW/kg (Calculated) : 75.3 Heparin Dosing Weight: 89kg  Vital Signs: Temp: 97.9 F (36.6 C) (10/21 0722) Temp Source: Oral (10/21 0342) BP: 131/74 (10/21 0722) Pulse Rate: 61 (10/21 0722)  Labs: Recent Labs    08/19/22 1535 08/20/22 0300 08/20/22 0300 08/20/22 1131 08/20/22 2227 08/21/22 0117  HGB  --  14.1  --   --   --  13.8  HCT  --  41.6  --   --   --  41.0  PLT  --  108*  --   --   --  108*  HEPARINUNFRC  --  0.17*   < > 0.20* 0.34 0.36  CREATININE 0.86 0.86  --   --   --   --    < > = values in this interval not displayed.     Estimated Creatinine Clearance: 77.8 mL/min (by C-G formula based on SCr of 0.86 mg/dL).   Medical History: Past Medical History:  Diagnosis Date   Arthritis    Coronary artery disease    Hypertension    Migraine    Multiple vessel coronary artery disease 08/20/2022   Presented with unstable angina 08/19/2022: Cath -> ost-prox RCA 40%, prox-mid RCA 100% (~CTO w/ L-R collaterals); Ost-Prox LAD 40% prox-midLAD 99% (appears to be subtotally occluded at major SP branch.); Prox-mid LCx 70%, OM2 70%.  LVEF 25 to 35% (moderate to severely reduced function.  Severe HK of anterior wall 15 mmHg). ==> CABG, concern for PCI of the LAD could jeopardize large SP branch. => W   Prostate cancer [facility])    Prostate cancer [facility])    with radiation   Stroke ([place]) 06/10/2013   Stroke [facility])     Assessment: 76 year old male admitted after elective cath found multivessel CAD, patient awaiting surgical input. Patient does not appear to be on anticoagulation prior to admit, however he did take plavix PTA.  Heparin level 0.36 therapeutic on 1450 units/hr. Hgb 13.8, PLT 108 stable. No issues with infusion or bleeding noted.  Goal of Therapy:  Heparin  level 0.3-0.7 units/ml Monitor platelets by anticoagulation protocol: Yes   Plan:  Continue heparin at 1450 units/hr Daily heparin level and CBC while on heparin Monitor for s/sx bleeding F/u plans for CABG on 10/24  Eliseo Gum, PharmD PGY1 Pharmacy Resident   08/21/2022  7:48 AM

## 2022-08-21 NOTE — Progress Notes (Addendum)
Progress Note  Patient Name: Randy Myers Date of Encounter: 08/21/2022  Primary Cardiologist: None  Subjective   No acute events overnight. No symptoms. Walked in the hallway with no symptoms.  Inpatient Medications    Scheduled Meds:  aspirin  81 mg Oral Daily   atorvastatin  80 mg Oral Daily   metoprolol succinate  12.5 mg Oral Daily   multivitamin with minerals  1 tablet Oral Daily   pantoprazole  40 mg Oral Daily   sodium chloride flush  3 mL Intravenous Q12H   spironolactone  12.5 mg Oral Daily   Continuous Infusions:  sodium chloride     heparin 1,450 Units/hr (08/21/22 0912)   PRN Meds: sodium chloride, acetaminophen, ondansetron (ZOFRAN) IV, oxyCODONE, sodium chloride flush   Vital Signs    Vitals:   08/20/22 2015 08/20/22 2343 08/21/22 0342 08/21/22 0722  BP: 132/78 (!) 155/93 138/80 131/74  Pulse: 63 67 60 61  Resp: '20 19 16 17  '$ Temp: 97.8 F (36.6 C) 97.9 F (36.6 C) 98 F (36.7 C) 97.9 F (36.6 C)  TempSrc: Oral Oral Oral   SpO2: 98% 100% 100% 99%  Weight:      Height:        Intake/Output Summary (Last 24 hours) at 08/21/2022 0947 Last data filed at 08/21/2022 0723 Gross per 24 hour  Intake 240 ml  Output 1625 ml  Net -1385 ml   Filed Weights   08/19/22 0555  Weight: 88.5 kg    Telemetry     Personally reviewed, HR controlled and NSR  ECG    An ECG dated 08/12/22 was personally reviewed today and demonstrated:  NSR, Q waves in inferior leads and anterior leads  Physical Exam   GEN: No acute distress.   Neck: No JVD. Cardiac: RRR, no murmur, rub, or gallop.  Respiratory: Nonlabored. Clear to auscultation bilaterally. GI: Soft, nontender, bowel sounds present. MS: No edema; No deformity. Neuro:  Nonfocal. Psych: Alert and oriented x 3. Normal affect.  Labs    Chemistry Recent Labs  Lab 08/19/22 1535 08/20/22 0300  NA 140 139  K 3.7 3.9  CL 108 108  CO2 25 25  GLUCOSE 105* 111*  BUN 18 15  CREATININE 0.86  0.86  CALCIUM 8.9 8.8*  GFRNONAA >60 >60  ANIONGAP 7 6     Hematology Recent Labs  Lab 08/20/22 0300 08/21/22 0117  WBC 4.5 5.2  RBC 4.57 4.54  HGB 14.1 13.8  HCT 41.6 41.0  MCV 91.0 90.3  MCH 30.9 30.4  MCHC 33.9 33.7  RDW 13.2 13.1  PLT 108* 108*    Cardiac EnzymesNo results for input(s): "TROPONINIHS" in the last 720 hours.  BNPNo results for input(s): "BNP", "PROBNP" in the last 168 hours.   DDimerNo results for input(s): "DDIMER" in the last 168 hours.   Cardiac studies    VAS US DOPPLER PRE CABG  Result Date: 08/20/2022 PREOPERATIVE VASCULAR EVALUATION Patient Name:  Randy Myers  Date of Exam:   08/20/2022 Medical Rec #: [id number]        Accession #:    [id number] Date of Birth: [birth year]         Patient Gender: M Patient Age:   76 years Exam Location:  [facility] Procedure:      VAS US DOPPLER PRE CABG Referring Phys: HARRELL LIGHTFOOT --------------------------------------------------------------------------------  Indications:      Pre-CABG. Risk Factors:     Hypertension, no history of smoking, coronary artery  disease. Comparison Study: No prior study Performing Technologist: Maudry Mayhew MHA, RVT, RDCS, RDMS  Examination Guidelines: A complete evaluation includes B-mode imaging, spectral Doppler, color Doppler, and power Doppler as needed of all accessible portions of each vessel. Bilateral testing is considered an integral part of a complete examination. Limited examinations for reoccurring indications may be performed as noted.  Right Carotid Findings: +----------+--------+-------+--------+----------------------+------------------+           PSV cm/sEDV    StenosisDescribe              Comments                             cm/s                                                    +----------+--------+-------+--------+----------------------+------------------+ CCA Prox  74      12                                                       +----------+--------+-------+--------+----------------------+------------------+ CCA Distal55      21                                   intimal thickening +----------+--------+-------+--------+----------------------+------------------+ ICA Prox  31      11             smooth and                                                                heterogenous                             +----------+--------+-------+--------+----------------------+------------------+ ICA Distal58      23                                                      +----------+--------+-------+--------+----------------------+------------------+ ECA       77      12             smooth and                                                                heterogenous                             +----------+--------+-------+--------+----------------------+------------------+ +----------+--------+-------+----------------+------------+           PSV cm/sEDV cmsDescribe  Arm Pressure +----------+--------+-------+----------------+------------+ Subclavian116            Multiphasic, WNL             +----------+--------+-------+----------------+------------+ +---------+--------+--+--------+--+---------+ VertebralPSV cm/s44EDV cm/s18Antegrade +---------+--------+--+--------+--+---------+ Left Carotid Findings: +----------+--------+--------+--------+-----------------------+--------+           PSV cm/sEDV cm/sStenosisDescribe               Comments +----------+--------+--------+--------+-----------------------+--------+ CCA Prox  66      17                                              +----------+--------+--------+--------+-----------------------+--------+ CCA Distal59      21                                              +----------+--------+--------+--------+-----------------------+--------+ ICA Prox  53      20              smooth and heterogenous          +----------+--------+--------+--------+-----------------------+--------+ ICA Distal44      17                                              +----------+--------+--------+--------+-----------------------+--------+ ECA       73      8                                               +----------+--------+--------+--------+-----------------------+--------+  +----------+--------+--------+----------------+------------+ SubclavianPSV cm/sEDV cm/sDescribe        Arm Pressure +----------+--------+--------+----------------+------------+           121             Multiphasic, WNL             +----------+--------+--------+----------------+------------+ +---------+--------+--+--------+----------------------+ VertebralPSV cm/s10EDV cm/sAntegrade and Atypical +---------+--------+--+--------+----------------------+  ABI Findings: +--------+------------------+-----+---------+--------+ Right   Rt Pressure (mmHg)IndexWaveform Comment  +--------+------------------+-----+---------+--------+ [id number]                    triphasic         +--------+------------------+-----+---------+--------+ PTA     139               1.19 triphasic         +--------+------------------+-----+---------+--------+ DP      144               1.23 biphasic          +--------+------------------+-----+---------+--------+ +--------+------------------+-----+---------+-------+ Left    Lt Pressure (mmHg)IndexWaveform Comment +--------+------------------+-----+---------+-------+ [id number]                    triphasic        +--------+------------------+-----+---------+-------+ PTA     135               1.15 triphasic        +--------+------------------+-----+---------+-------+ DP      139               1.19 triphasic        +--------+------------------+-----+---------+-------+ +-------+---------------+----------------+ ABI/TBIToday's ABI/TBIPrevious ABI/TBI  +-------+---------------+----------------+  Right  1.23                            +-------+---------------+----------------+ Left   1.19                            +-------+---------------+----------------+  Right Doppler Findings: +-----------+--------+-----+---------+-----------------------------------------+ Site       PressureIndexDoppler  Comments                                  +-----------+--------+-----+---------+-----------------------------------------+ Brachial   113          triphasic                                          +-----------+--------+-----+---------+-----------------------------------------+ Radial                  triphasic                                          +-----------+--------+-----+---------+-----------------------------------------+ Ulnar                   triphasic                                          +-----------+--------+-----+---------+-----------------------------------------+ Palmar Arch                      Signal decreases >50% with radial                                          compression, signal is unaffected with                                     ulnar compression.                        +-----------+--------+-----+---------+-----------------------------------------+  Left Doppler Findings: +-----------+--------+-----+---------+-----------------------------------------+ Site       PressureIndexDoppler  Comments                                  +-----------+--------+-----+---------+-----------------------------------------+ Brachial   117          triphasic                                          +-----------+--------+-----+---------+-----------------------------------------+ Radial                  triphasic                                          +-----------+--------+-----+---------+-----------------------------------------+ Ulnar  triphasic                                           +-----------+--------+-----+---------+-----------------------------------------+ Palmar Arch                      Signal obliterates with radial                                             compression, is unaffected with ulnar                                      compression.                              +-----------+--------+-----+---------+-----------------------------------------+   Summary: Right Carotid: The extracranial vessels were near-normal with only minimal wall                thickening or plaque. Left Carotid: The extracranial vessels were near-normal with only minimal wall               thickening or plaque. Vertebrals:  Bilateral vertebral arteries demonstrate antegrade flow. Left              vertebral artery demonstrates high resistant flow. Subclavians: Normal flow hemodynamics were seen in bilateral subclavian              arteries. Right ABI: Resting right ankle-brachial index is within normal range. Left ABI: Resting left ankle-brachial index is within normal range. Right Upper Extremity: Doppler waveforms decrease >50% with right radial compression. Doppler waveforms remain within normal limits with right ulnar compression. Left Upper Extremity: Doppler waveform obliterate with left radial compression. Doppler waveforms remain within normal limits with left ulnar compression.  Electronically signed by Servando Snare MD on 08/20/2022 at 5:27:04 PM.    Final    ECHOCARDIOGRAM COMPLETE  Result Date: 08/19/2022    ECHOCARDIOGRAM REPORT   Patient Name:   AWAD GLADD Mogan Date of Exam: 08/19/2022 Medical Rec #:  [id number]       Height:       71.0 in Accession #:    [id number]      Weight:       195.0 lb Date of Birth:  Sep 06, 1946        BSA:          2.086 m Patient Age:    31 years        BP:           128/65 mmHg Patient Gender: M               HR:           62 bpm. Exam Location:  Inpatient Procedure: 2D Echo, Cardiac Doppler, Color Doppler and Intracardiac             Opacification Agent Indications:    CAD native vessel  History:        Patient has prior history of Echocardiogram examinations, most                 recent 06/11/2013. Angina; Risk Factors:Hypertension and  Dyslipidemia. Hx CVA.  Sonographer:    Clayton Lefort RDCS (AE) Referring Phys: [place] Comments: Suboptimal subcostal window. IMPRESSIONS  1. Akinesis of the distal anteroseptal and apical walls with overall moderate to severe LV dysfunction; probable early apical thrombus noted using definity; calcified aortic valve with partial fusion of noncoronary and left cusps; no significant AS by doppler.  2. Left ventricular ejection fraction, by estimation, is 30 to 35%. The left ventricle has moderate to severely decreased function. The left ventricle demonstrates regional wall motion abnormalities (see scoring diagram/findings for description). There is mild concentric left ventricular hypertrophy. Left ventricular diastolic parameters are consistent with Grade I diastolic dysfunction (impaired relaxation).  3. Right ventricular systolic function is normal. The right ventricular size is normal.  4. The mitral valve is normal in structure. No evidence of mitral valve regurgitation. No evidence of mitral stenosis. Moderate mitral annular calcification.  5. The aortic valve is calcified. Aortic valve regurgitation is not visualized. No aortic stenosis is present.  6. Aortic dilatation noted. There is borderline dilatation of the aortic root, measuring 38 mm. FINDINGS  Left Ventricle: Left ventricular ejection fraction, by estimation, is 30 to 35%. The left ventricle has moderate to severely decreased function. The left ventricle demonstrates regional wall motion abnormalities. Definity contrast agent was given IV to delineate the left ventricular endocardial borders. The left ventricular internal cavity size was normal in size. There is mild concentric left ventricular  hypertrophy. Left ventricular diastolic parameters are consistent with Grade I diastolic dysfunction (impaired relaxation). Right Ventricle: The right ventricular size is normal. Right ventricular systolic function is normal. Left Atrium: Left atrial size was normal in size. Right Atrium: Right atrial size was normal in size. Pericardium: There is no evidence of pericardial effusion. Mitral Valve: The mitral valve is normal in structure. Moderate mitral annular calcification. No evidence of mitral valve regurgitation. No evidence of mitral valve stenosis. Tricuspid Valve: The tricuspid valve is normal in structure. Tricuspid valve regurgitation is trivial. No evidence of tricuspid stenosis. Aortic Valve: The aortic valve is calcified. Aortic valve regurgitation is not visualized. No aortic stenosis is present. Aortic valve mean gradient measures 4.0 mmHg. Aortic valve peak gradient measures 7.4 mmHg. Aortic valve area, by VTI measures 2.44 cm. Pulmonic Valve: The pulmonic valve was normal in structure. Pulmonic valve regurgitation is not visualized. No evidence of pulmonic stenosis. Aorta: Aortic dilatation noted. There is borderline dilatation of the aortic root, measuring 38 mm. Venous: The inferior vena cava was not well visualized. IAS/Shunts: No atrial level shunt detected by color flow Doppler. Additional Comments: Akinesis of the distal anteroseptal and apical walls with overall moderate to severe LV dysfunction; probable early apical thrombus noted using definity; calcified aortic valve with partial fusion of noncoronary and left cusps; no significant AS by doppler.  LEFT VENTRICLE PLAX 2D LVIDd:         4.90 cm      Diastology LVIDs:         3.80 cm      LV e' medial:    4.13 cm/s LV PW:         1.00 cm      LV E/e' medial:  12.3 LV IVS:        1.20 cm      LV e' lateral:   4.03 cm/s LVOT diam:     2.20 cm      LV E/e' lateral: 12.6 LV SV:  62 LV SV Index:   30 LVOT Area:     3.80 cm  LV Volumes  (MOD) LV vol d, MOD A4C: 151.0 ml LV vol s, MOD A4C: 80.2 ml LV SV MOD A4C:     151.0 ml RIGHT VENTRICLE RV Basal diam:  2.80 cm RV S prime:     7.29 cm/s TAPSE (M-mode): 2.0 cm LEFT ATRIUM             Index        RIGHT ATRIUM           Index LA diam:        4.50 cm 2.16 cm/m   RA Area:     13.60 cm LA Vol (A2C):   56.2 ml 26.94 ml/m  RA Volume:   26.80 ml  12.85 ml/m LA Vol (A4C):   57.9 ml 27.76 ml/m LA Biplane Vol: 57.7 ml 27.66 ml/m  AORTIC VALVE AV Area (Vmax):    2.11 cm AV Area (Vmean):   2.03 cm AV Area (VTI):     2.44 cm AV Vmax:           136.00 cm/s AV Vmean:          92.400 cm/s AV VTI:            0.255 m AV Peak Grad:      7.4 mmHg AV Mean Grad:      4.0 mmHg LVOT Vmax:         75.40 cm/s LVOT Vmean:        49.300 cm/s LVOT VTI:          0.164 m LVOT/AV VTI ratio: 0.64  AORTA Ao Root diam: 3.80 cm MITRAL VALVE MV Area (PHT): 2.57 cm    SHUNTS MV Decel Time: 295 msec    Systemic VTI:  0.16 m MV E velocity: 50.80 cm/s  Systemic Diam: 2.20 cm MV A velocity: 92.80 cm/s MV E/A ratio:  0.55 Kirk Ruths MD Electronically signed by Kirk Ruths MD Signature Date/Time: 08/19/2022/4:39:12 PM    Final      Assessment & Plan   Patient is a 76 y/o M known to have CAD s/p multivessel disease pending CABG on 08/24/22, HTN, HLD, CVA is admitted to cardiology service for CABG work-up and surgery planned on 08/24/22.  #CAD s/p multivessel disease with LVEF 25-30%, CABG planned on 08/24/22 PLAN -Continue aspirin '81mg'$  once daily and atorvastatin '80mg'$  QHS -Continue Metoprolol succinate 12.'5mg'$  once daily -Lisinopril last dose on 08/19/22 and start Entresto close to CABG -Increase Spironolactone from 12.'5mg'$  to '25mg'$  once daily  #Ischemic CDM, LVEF 25-30%, currently compensated PLAN -Continue Metoprolol succinate 12.'5mg'$  once daily -Lisinopril last dose on 08/19/22 and start Entresto close to discharge -Increase Spironolactone from 12.'5mg'$  to '25mg'$  once daily -Concern for LV thrombus on Echo,  currently on Heparin drip. Will switch to Warfarin on discharge.  #HLD PLAN -Continue atorvastatin '80mg'$  QHS  #HTN, controlled PLAN -Continue Metoprolol succinate 12.'5mg'$  once daily -Lisinopril last dose on 08/19/22 and start Entresto close to CABG -Increase Spironolactone from 12.'5mg'$  to '25mg'$  once daily  #CVA hX PLAN --Continue aspirin '81mg'$  once daily and atorvastatin '80mg'$  QHS  I have spent a total of 37 minutes with patient reviewing chart , telemetry, EKGs, labs and examining patient as well as establishing an assessment and plan that was discussed with the patient.  > 50% of time was spent in direct patient care.      Signed, Angeleena Dueitt Olene Craven, MD  08/21/2022, 9:47 AM

## 2022-08-21 NOTE — Progress Notes (Signed)
CARDIAC REHAB PHASE I   PRE:  Rate/Rhythm: 70 SR \ BP:  Supine:   Sitting: 129/79           Standing:    SaO2: 96 RA  MODE:  Ambulation: 400 ft   POST:  Rate/Rhythm: 74 SR  BP:  Supine:   Sitting: 138/78  Standing:    SaO2: 98 RA 1200-1222  Assisted X1 to ambulate. Pt held to IV pole to walk steady gait.VS stable. Pt to side of bed after walk with call light in reach. Pt denies any questions related to impending surgery at this time.  Rodney Langton RN 08/21/2022 12:17 PM

## 2022-08-21 NOTE — Progress Notes (Signed)
Mobility Specialist Progress Note:   08/21/22 0905  Mobility  Activity Ambulated with assistance in hallway  Level of Assistance Standby assist, set-up cues, supervision of patient - no hands on  Assistive Device None  Distance Ambulated (ft) 300 ft  Activity Response Tolerated well  $Mobility charge 1 Mobility   Pt received in bed willing to participate in mobility. No complaints of pain. Left in bed with call bell in reach and all needs met.   [facility] Surveyor, mining Chat only

## 2022-08-22 DIAGNOSIS — E785 Hyperlipidemia, unspecified: Secondary | ICD-10-CM | POA: Diagnosis not present

## 2022-08-22 DIAGNOSIS — I2510 Atherosclerotic heart disease of native coronary artery without angina pectoris: Secondary | ICD-10-CM | POA: Diagnosis not present

## 2022-08-22 DIAGNOSIS — I255 Ischemic cardiomyopathy: Secondary | ICD-10-CM | POA: Diagnosis not present

## 2022-08-22 DIAGNOSIS — I10 Essential (primary) hypertension: Secondary | ICD-10-CM | POA: Diagnosis not present

## 2022-08-22 LAB — CBC
HCT: 42 % (ref 39.0–52.0)
Hemoglobin: 14.3 g/dL (ref 13.0–17.0)
MCH: 30.6 pg (ref 26.0–34.0)
MCHC: 34 g/dL (ref 30.0–36.0)
MCV: 89.9 fL (ref 80.0–100.0)
Platelets: 113 10*3/uL — ABNORMAL LOW (ref 150–400)
RBC: 4.67 MIL/uL (ref 4.22–5.81)
RDW: 12.9 % (ref 11.5–15.5)
WBC: 5 10*3/uL (ref 4.0–10.5)
nRBC: 0 % (ref 0.0–0.2)

## 2022-08-22 LAB — HEPARIN LEVEL (UNFRACTIONATED)
Heparin Unfractionated: 0.29 IU/mL — ABNORMAL LOW (ref 0.30–0.70)
Heparin Unfractionated: 0.41 IU/mL (ref 0.30–0.70)

## 2022-08-22 NOTE — Progress Notes (Addendum)
Progress Note  Patient Name: Randy Myers Date of Encounter: 08/22/2022  Primary Cardiologist: None  Subjective   No acute events overnight. Denied any angina or DOE.  Inpatient Medications    Scheduled Meds:  aspirin  81 mg Oral Daily   atorvastatin  80 mg Oral Daily   metoprolol succinate  12.5 mg Oral Daily   multivitamin with minerals  1 tablet Oral Daily   pantoprazole  40 mg Oral Daily   sodium chloride flush  3 mL Intravenous Q12H   spironolactone  25 mg Oral Daily   Continuous Infusions:  sodium chloride     heparin 1,450 Units/hr (08/22/22 0311)   PRN Meds: sodium chloride, acetaminophen, ondansetron (ZOFRAN) IV, oxyCODONE, sodium chloride flush   Vital Signs    Vitals:   08/22/22 0310 08/22/22 0500 08/22/22 0748 08/22/22 0802  BP: 137/72  126/71   Pulse: 61   67  Resp: '16 16 18   '$ Temp: 98.1 F (36.7 C)     TempSrc: Oral     SpO2: 100%  96%   Weight:  86.5 kg    Height:        Intake/Output Summary (Last 24 hours) at 08/22/2022 0941 Last data filed at 08/22/2022 0700 Gross per 24 hour  Intake 1165.31 ml  Output 2250 ml  Net -1084.69 ml   Filed Weights   08/19/22 0555 08/22/22 0500  Weight: 88.5 kg 86.5 kg    Telemetry     Personally reviewed, HR normal.  ECG    An ECG dated 08/12/22 was personally reviewed today and demonstrated:  NSR, Q waves in anterior and inferior leads  Physical Exam   GEN: No acute distress.   Neck: No JVD. Cardiac: RRR, no murmur, rub, or gallop.  Respiratory: Nonlabored. Clear to auscultation bilaterally. GI: Soft, nontender, bowel sounds present. MS: No edema; No deformity. Neuro:  Nonfocal. Psych: Alert and oriented x 3. Normal affect.  Labs    Chemistry Recent Labs  Lab 08/19/22 1535 08/20/22 0300  NA 140 139  K 3.7 3.9  CL 108 108  CO2 25 25  GLUCOSE 105* 111*  BUN 18 15  CREATININE 0.86 0.86  CALCIUM 8.9 8.8*  GFRNONAA >60 >60  ANIONGAP 7 6     Hematology Recent Labs  Lab  08/20/22 0300 08/21/22 0117 08/22/22 0057  WBC 4.5 5.2 5.0  RBC 4.57 4.54 4.67  HGB 14.1 13.8 14.3  HCT 41.6 41.0 42.0  MCV 91.0 90.3 89.9  MCH 30.9 30.4 30.6  MCHC 33.9 33.7 34.0  RDW 13.2 13.1 12.9  PLT 108* 108* 113*    Cardiac EnzymesNo results for input(s): "TROPONINIHS" in the last 720 hours.  BNPNo results for input(s): "BNP", "PROBNP" in the last 168 hours.   DDimerNo results for input(s): "DDIMER" in the last 168 hours.   Radiology    VAS US DOPPLER PRE CABG  Result Date: 08/20/2022 PREOPERATIVE VASCULAR EVALUATION Patient Name:  Randy Myers  Date of Exam:   08/20/2022 Medical Rec #: [id number]        Accession #:    [id number] Date of Birth: Oct 15, 1946         Patient Gender: M Patient Age:   5 years Exam Location:  [facility] Procedure:      VAS US DOPPLER PRE CABG Referring Phys: HARRELL LIGHTFOOT --------------------------------------------------------------------------------  Indications:      Pre-CABG. Risk Factors:     Hypertension, no history of smoking, coronary artery disease. Comparison  Study: No prior study Performing Technologist: Maudry Mayhew MHA, RVT, RDCS, RDMS  Examination Guidelines: A complete evaluation includes B-mode imaging, spectral Doppler, color Doppler, and power Doppler as needed of all accessible portions of each vessel. Bilateral testing is considered an integral part of a complete examination. Limited examinations for reoccurring indications may be performed as noted.  Right Carotid Findings: +----------+--------+-------+--------+----------------------+------------------+           PSV cm/sEDV    StenosisDescribe              Comments                             cm/s                                                    +----------+--------+-------+--------+----------------------+------------------+ CCA Prox  74      12                                                       +----------+--------+-------+--------+----------------------+------------------+ CCA Distal55      21                                   intimal thickening +----------+--------+-------+--------+----------------------+------------------+ ICA Prox  31      11             smooth and                                                                heterogenous                             +----------+--------+-------+--------+----------------------+------------------+ ICA Distal58      23                                                      +----------+--------+-------+--------+----------------------+------------------+ ECA       77      12             smooth and                                                                heterogenous                             +----------+--------+-------+--------+----------------------+------------------+ +----------+--------+-------+----------------+------------+           PSV cm/sEDV cmsDescribe  Arm Pressure +----------+--------+-------+----------------+------------+ Subclavian116            Multiphasic, WNL             +----------+--------+-------+----------------+------------+ +---------+--------+--+--------+--+---------+ VertebralPSV cm/s44EDV cm/s18Antegrade +---------+--------+--+--------+--+---------+ Left Carotid Findings: +----------+--------+--------+--------+-----------------------+--------+           PSV cm/sEDV cm/sStenosisDescribe               Comments +----------+--------+--------+--------+-----------------------+--------+ CCA Prox  66      17                                              +----------+--------+--------+--------+-----------------------+--------+ CCA Distal59      21                                              +----------+--------+--------+--------+-----------------------+--------+ ICA Prox  53      20              smooth and heterogenous          +----------+--------+--------+--------+-----------------------+--------+ ICA Distal44      17                                              +----------+--------+--------+--------+-----------------------+--------+ ECA       73      8                                               +----------+--------+--------+--------+-----------------------+--------+  +----------+--------+--------+----------------+------------+ SubclavianPSV cm/sEDV cm/sDescribe        Arm Pressure +----------+--------+--------+----------------+------------+           121             Multiphasic, WNL             +----------+--------+--------+----------------+------------+ +---------+--------+--+--------+----------------------+ VertebralPSV cm/s10EDV cm/sAntegrade and Atypical +---------+--------+--+--------+----------------------+  ABI Findings: +--------+------------------+-----+---------+--------+ Right   Rt Pressure (mmHg)IndexWaveform Comment  +--------+------------------+-----+---------+--------+ [id number]                    triphasic         +--------+------------------+-----+---------+--------+ PTA     139               1.19 triphasic         +--------+------------------+-----+---------+--------+ DP      144               1.23 biphasic          +--------+------------------+-----+---------+--------+ +--------+------------------+-----+---------+-------+ Left    Lt Pressure (mmHg)IndexWaveform Comment +--------+------------------+-----+---------+-------+ [id number]                    triphasic        +--------+------------------+-----+---------+-------+ PTA     135               1.15 triphasic        +--------+------------------+-----+---------+-------+ DP      139               1.19 triphasic        +--------+------------------+-----+---------+-------+ +-------+---------------+----------------+ ABI/TBIToday's ABI/TBIPrevious ABI/TBI  +-------+---------------+----------------+  Right  1.23                            +-------+---------------+----------------+ Left   1.19                            +-------+---------------+----------------+  Right Doppler Findings: +-----------+--------+-----+---------+-----------------------------------------+ Site       PressureIndexDoppler  Comments                                  +-----------+--------+-----+---------+-----------------------------------------+ Brachial   113          triphasic                                          +-----------+--------+-----+---------+-----------------------------------------+ Radial                  triphasic                                          +-----------+--------+-----+---------+-----------------------------------------+ Ulnar                   triphasic                                          +-----------+--------+-----+---------+-----------------------------------------+ Palmar Arch                      Signal decreases >50% with radial                                          compression, signal is unaffected with                                     ulnar compression.                        +-----------+--------+-----+---------+-----------------------------------------+  Left Doppler Findings: +-----------+--------+-----+---------+-----------------------------------------+ Site       PressureIndexDoppler  Comments                                  +-----------+--------+-----+---------+-----------------------------------------+ Brachial   117          triphasic                                          +-----------+--------+-----+---------+-----------------------------------------+ Radial                  triphasic                                          +-----------+--------+-----+---------+-----------------------------------------+ Ulnar  triphasic                                           +-----------+--------+-----+---------+-----------------------------------------+ Palmar Arch                      Signal obliterates with radial                                             compression, is unaffected with ulnar                                      compression.                              +-----------+--------+-----+---------+-----------------------------------------+   Summary: Right Carotid: The extracranial vessels were near-normal with only minimal wall                thickening or plaque. Left Carotid: The extracranial vessels were near-normal with only minimal wall               thickening or plaque. Vertebrals:  Bilateral vertebral arteries demonstrate antegrade flow. Left              vertebral artery demonstrates high resistant flow. Subclavians: Normal flow hemodynamics were seen in bilateral subclavian              arteries. Right ABI: Resting right ankle-brachial index is within normal range. Left ABI: Resting left ankle-brachial index is within normal range. Right Upper Extremity: Doppler waveforms decrease >50% with right radial compression. Doppler waveforms remain within normal limits with right ulnar compression. Left Upper Extremity: Doppler waveform obliterate with left radial compression. Doppler waveforms remain within normal limits with left ulnar compression.  Electronically signed by Servando Snare MD on 08/20/2022 at 5:27:04 PM.    Final     Cardiac Studies  Echo in 08/2022 [place] of the distal anteroseptal and apical walls with overall moderate to severe LV dysfunction; probable early apical thrombus noted using definity; calcified aortic valve with partial fusion of noncoronary and left cusps; no significant AS by doppler.   Assessment & Plan   Patient is a 76 year old male has CAD status post multivessel disease pending CABG on 08/20/2022, HTN, HLD, CVAs admitted to cardiology service for CABG work-up and surgery planned on  08/24/2022.  #CAD status post (with LVEF 25 to 30%, CABG planned on 08/24/2022, currently angina free Plan -Continue aspirin 81 mg once daily and atorvastatin 80 mg nightly -Continue metoprolol succinate 12.5 mg once daily -Lisinopril last dose on 08/19/2022 and stop Entresto after CABG -Continue spironolactone 25 mg once daily  #Ischemic CM, LVEF 25 to 30%, currently compensated Plan -Continue metoprolol succinate 12.5 mg daily -Lisinopril last dose on 08/19/2022 and stop Entresto after CABG -Continue spironolactone 25 mg once daily  #HLD Plan -Continue atorvastatin 80 mg nightly  #HTN, controlled Plan -Continue metoprolol succinate 12.5 mg daily -Lisinopril last dose on 08/19/2022 and stop Entresto after CABG -Continue spironolactone 25 mg once daily  #CVA Hx Plan -Continue aspirin 81 mg once daily and atorvastatin 80 mg nightly  I have spent a total  of 37 minutes with patient reviewing chart , telemetry, EKGs, labs and examining patient as well as establishing an assessment and plan that was discussed with the patient.  > 50% of time was spent in direct patient care.      Signed, Chalmers Guest, MD  08/22/2022, 9:41 AM

## 2022-08-22 NOTE — Progress Notes (Signed)
ANTICOAGULATION CONSULT NOTE   Pharmacy Consult for heparin Indication: chest pain/ACS, s/p cath, awaiting CABG  No Known Allergies  Patient Measurements: Height: '5\' 11"'$  (180.3 cm) Weight: 86.5 kg (190 lb 11.2 oz) IBW/kg (Calculated) : 75.3 Heparin Dosing Weight: 89kg  Vital Signs: Temp: 97.6 F (36.4 C) (10/22 1518) Temp Source: Oral (10/22 1518) BP: 127/75 (10/22 1518) Pulse Rate: 67 (10/22 0802)  Labs: Recent Labs    08/20/22 0300 08/20/22 1131 08/21/22 0117 08/22/22 0057 08/22/22 1559  HGB 14.1  --  13.8 14.3  --   HCT 41.6  --  41.0 42.0  --   PLT 108*  --  108* 113*  --   HEPARINUNFRC 0.17*   < > 0.36 0.29* 0.41  CREATININE 0.86  --   --   --   --    < > = values in this interval not displayed.     Estimated Creatinine Clearance: 77.8 mL/min (by C-G formula based on SCr of 0.86 mg/dL).   Medical History: Past Medical History:  Diagnosis Date   Arthritis    Coronary artery disease    Hypertension    Migraine    Multiple vessel coronary artery disease 08/20/2022   Presented with unstable angina 08/19/2022: Cath -> ost-prox RCA 40%, prox-mid RCA 100% (~CTO w/ L-R collaterals); Ost-Prox LAD 40% prox-midLAD 99% (appears to be subtotally occluded at major SP branch.); Prox-mid LCx 70%, OM2 70%.  LVEF 25 to 35% (moderate to severely reduced function.  Severe HK of anterior wall 15 mmHg). ==> CABG, concern for PCI of the LAD could jeopardize large SP branch. => W   Prostate cancer [facility])    Prostate cancer [facility])    with radiation   Stroke ([place]) 06/10/2013   Stroke [facility])     Assessment: 76 year old male admitted after elective cath found multivessel CAD, patient awaiting surgical input. Patient does not appear to be on anticoagulation prior to admit, however he did take plavix PTA.  Heparin level slightly subtherapeutic at 0.29 on 1450 units/hr. Previous two heparin levels have been therapeutic at this rate. Discussed with RN, patient's IV site had visible  leakage of heparin along his arm and on his sheets. RN is unsure of how much has leaked prior to when this was noticed. IV site was re-dressed at [place] on 10/22 and now infusion is running appropriately and no bleeding noted. Hgb 14.3, PLT 113 stable.   PM f/u > heparin level 0.41 on heparin at 1450 units/hr.  No overt bleeding or complications noted.  Goal of Therapy:  Heparin level 0.3-0.7 units/ml Monitor platelets by anticoagulation protocol: Yes   Plan:  Continue heparin at 1450 units/hr Daily heparin level and CBC while on heparin Monitor for s/sx bleeding F/u plans for CABG on 10/24  Marguerite Olea, [facility] Clinical Pharmacist  08/22/2022 5:00 PM   [facility] pharmacy phone numbers are listed on [email or web address]

## 2022-08-22 NOTE — Progress Notes (Signed)
ANTICOAGULATION CONSULT NOTE   Pharmacy Consult for heparin Indication: chest pain/ACS, s/p cath, awaiting CABG  No Known Allergies  Patient Measurements: Height: '5\' 11"'$  (180.3 cm) Weight: 86.5 kg (190 lb 11.2 oz) IBW/kg (Calculated) : 75.3 Heparin Dosing Weight: 89kg  Vital Signs: Temp: 98.1 F (36.7 C) (10/22 0310) Temp Source: Oral (10/22 0310) BP: 126/71 (10/22 0748) Pulse Rate: 67 (10/22 0802)  Labs: Recent Labs     0000 08/19/22 1535 08/20/22 0300 08/20/22 1131 08/20/22 2227 08/21/22 0117 08/22/22 0057  HGB   < >  --  14.1  --   --  13.8 14.3  HCT  --   --  41.6  --   --  41.0 42.0  PLT  --   --  108*  --   --  108* 113*  HEPARINUNFRC  --   --  0.17*   < > 0.34 0.36 0.29*  CREATININE  --  0.86 0.86  --   --   --   --    < > = values in this interval not displayed.     Estimated Creatinine Clearance: 77.8 mL/min (by C-G formula based on SCr of 0.86 mg/dL).   Medical History: Past Medical History:  Diagnosis Date   Arthritis    Coronary artery disease    Hypertension    Migraine    Multiple vessel coronary artery disease 08/20/2022   Presented with unstable angina 08/19/2022: Cath -> ost-prox RCA 40%, prox-mid RCA 100% (~CTO w/ L-R collaterals); Ost-Prox LAD 40% prox-midLAD 99% (appears to be subtotally occluded at major SP branch.); Prox-mid LCx 70%, OM2 70%.  LVEF 25 to 35% (moderate to severely reduced function.  Severe HK of anterior wall 15 mmHg). ==> CABG, concern for PCI of the LAD could jeopardize large SP branch. => W   Prostate cancer [facility])    Prostate cancer [facility])    with radiation   Stroke ([place]) 06/10/2013   Stroke [facility])     Assessment: 76 year old male admitted after elective cath found multivessel CAD, patient awaiting surgical input. Patient does not appear to be on anticoagulation prior to admit, however he did take plavix PTA.  Heparin level slightly subtherapeutic at 0.29 on 1450 units/hr. Previous two heparin levels have been  therapeutic at this rate. Discussed with RN, patient's IV site had visible leakage of heparin along his arm and on his sheets. RN is unsure of how much has leaked prior to when this was noticed. IV site was re-dressed at [place] on 10/22 and now infusion is running appropriately and no bleeding noted. Hgb 14.3, PLT 113 stable.   Goal of Therapy:  Heparin level 0.3-0.7 units/ml Monitor platelets by anticoagulation protocol: Yes   Plan:  Continue heparin at 1450 units/hr Check 8 hour heparin level  Daily heparin level and CBC while on heparin Monitor for s/sx bleeding F/u plans for CABG on 10/24  Eliseo Gum, PharmD PGY1 Pharmacy Resident   08/22/2022  8:07 AM

## 2022-08-22 NOTE — Progress Notes (Signed)
Mobility Specialist Progress Note:   08/22/22 0915  Mobility  Activity Ambulated with assistance in hallway  Level of Assistance Standby assist, set-up cues, supervision of patient - no hands on  Assistive Device None  Distance Ambulated (ft) 450 ft  Activity Response Tolerated well  $Mobility charge 1 Mobility   Pt received in chair willing to participate in mobility. No complaints of pain. Left in chair with call bell in reach and all needs met.   [facility] Surveyor, mining Chat only

## 2022-08-23 ENCOUNTER — Inpatient Hospital Stay (HOSPITAL_COMMUNITY): Payer: Medicare HMO

## 2022-08-23 LAB — BLOOD GAS, ARTERIAL
Acid-Base Excess: 3.2 mmol/L — ABNORMAL HIGH (ref 0.0–2.0)
Bicarbonate: 26.9 mmol/L (ref 20.0–28.0)
Drawn by: 406621
O2 Saturation: 98.6 %
Patient temperature: 37
pCO2 arterial: 37 mmHg (ref 32–48)
pH, Arterial: 7.47 — ABNORMAL HIGH (ref 7.35–7.45)
pO2, Arterial: 101 mmHg (ref 83–108)

## 2022-08-23 LAB — CBC
HCT: 41.7 % (ref 39.0–52.0)
Hemoglobin: 14.4 g/dL (ref 13.0–17.0)
MCH: 31.1 pg (ref 26.0–34.0)
MCHC: 34.5 g/dL (ref 30.0–36.0)
MCV: 90.1 fL (ref 80.0–100.0)
Platelets: 103 10*3/uL — ABNORMAL LOW (ref 150–400)
RBC: 4.63 MIL/uL (ref 4.22–5.81)
RDW: 12.9 % (ref 11.5–15.5)
WBC: 4.9 10*3/uL (ref 4.0–10.5)
nRBC: 0 % (ref 0.0–0.2)

## 2022-08-23 LAB — PREPARE RBC (CROSSMATCH)

## 2022-08-23 LAB — URINALYSIS, ROUTINE W REFLEX MICROSCOPIC
Bilirubin Urine: NEGATIVE
Glucose, UA: NEGATIVE mg/dL
Hgb urine dipstick: NEGATIVE
Ketones, ur: NEGATIVE mg/dL
Leukocytes,Ua: NEGATIVE
Nitrite: NEGATIVE
Protein, ur: NEGATIVE mg/dL
Specific Gravity, Urine: 1.02 (ref 1.005–1.030)
pH: 5 (ref 5.0–8.0)

## 2022-08-23 LAB — ABO/RH: ABO/RH(D): A POS

## 2022-08-23 LAB — SURGICAL PCR SCREEN
MRSA, PCR: NEGATIVE
Staphylococcus aureus: NEGATIVE

## 2022-08-23 LAB — PROTIME-INR
INR: 1.1 (ref 0.8–1.2)
Prothrombin Time: 13.6 seconds (ref 11.4–15.2)

## 2022-08-23 LAB — APTT: aPTT: 98 seconds — ABNORMAL HIGH (ref 24–36)

## 2022-08-23 LAB — HEPARIN LEVEL (UNFRACTIONATED): Heparin Unfractionated: 0.27 IU/mL — ABNORMAL LOW (ref 0.30–0.70)

## 2022-08-23 MED ORDER — TEMAZEPAM 7.5 MG PO CAPS: 15.0000 mg | ORAL_CAPSULE | Freq: Once | ORAL | Status: DC | PRN

## 2022-08-23 MED ORDER — CEFAZOLIN SODIUM-DEXTROSE 2-4 GM/100ML-% IV SOLN
2.0000 g | INTRAVENOUS | Status: AC
  Administered 2022-08-24: 2 g via INTRAVENOUS
  Filled 2022-08-23: qty 100

## 2022-08-23 MED ORDER — CHLORHEXIDINE GLUCONATE CLOTH 2 % EX PADS
6.0000 | MEDICATED_PAD | Freq: Once | CUTANEOUS | Status: AC
  Administered 2022-08-23: 6 via TOPICAL

## 2022-08-23 MED ORDER — POTASSIUM CHLORIDE 2 MEQ/ML IV SOLN
80.0000 meq | INTRAVENOUS | Status: DC
  Filled 2022-08-23: qty 40

## 2022-08-23 MED ORDER — CHLORHEXIDINE GLUCONATE CLOTH 2 % EX PADS
6.0000 | MEDICATED_PAD | Freq: Once | CUTANEOUS | Status: AC
  Administered 2022-08-24: 6 via TOPICAL

## 2022-08-23 MED ORDER — TRANEXAMIC ACID 1000 MG/10ML IV SOLN
1.5000 mg/kg/h | INTRAVENOUS | Status: AC
  Administered 2022-08-24: 1.5 mg/kg/h via INTRAVENOUS
  Filled 2022-08-23: qty 25

## 2022-08-23 MED ORDER — BISACODYL 5 MG PO TBEC: 5.0000 mg | DELAYED_RELEASE_TABLET | Freq: Once | ORAL | Status: DC

## 2022-08-23 MED ORDER — INSULIN REGULAR(HUMAN) IN NACL 100-0.9 UT/100ML-% IV SOLN
INTRAVENOUS | Status: AC
  Administered 2022-08-24: 1.1 [IU]/h via INTRAVENOUS
  Filled 2022-08-23: qty 100

## 2022-08-23 MED ORDER — METOPROLOL TARTRATE 12.5 MG HALF TABLET
12.5000 mg | ORAL_TABLET | Freq: Once | ORAL | Status: AC
  Administered 2022-08-24: 12.5 mg via ORAL
  Filled 2022-08-23: qty 1

## 2022-08-23 MED ORDER — EPINEPHRINE HCL 5 MG/250ML IV SOLN IN NS
0.0000 ug/min | INTRAVENOUS | Status: DC
  Filled 2022-08-23: qty 250

## 2022-08-23 MED ORDER — DEXMEDETOMIDINE HCL IN NACL 400 MCG/100ML IV SOLN
0.1000 ug/kg/h | INTRAVENOUS | Status: AC
  Administered 2022-08-24: .3 ug/kg/h via INTRAVENOUS
  Filled 2022-08-23: qty 100

## 2022-08-23 MED ORDER — MANNITOL 20 % IV SOLN
INTRAVENOUS | Status: DC
  Filled 2022-08-23: qty 13

## 2022-08-23 MED ORDER — PLASMA-LYTE A IV SOLN
INTRAVENOUS | Status: DC
  Filled 2022-08-23: qty 2.5

## 2022-08-23 MED ORDER — HEPARIN 30,000 UNITS/1000 ML (OHS) CELLSAVER SOLUTION
Status: DC
  Filled 2022-08-23 (×2): qty 1000

## 2022-08-23 MED ORDER — VANCOMYCIN HCL 1500 MG/300ML IV SOLN
1500.0000 mg | INTRAVENOUS | Status: AC
  Administered 2022-08-24: 1500 mg via INTRAVENOUS
  Filled 2022-08-23: qty 300

## 2022-08-23 MED ORDER — NOREPINEPHRINE 4 MG/250ML-% IV SOLN
0.0000 ug/min | INTRAVENOUS | Status: AC
  Administered 2022-08-24: 4 ug/min via INTRAVENOUS
  Filled 2022-08-23: qty 250

## 2022-08-23 MED ORDER — PHENYLEPHRINE HCL-NACL 20-0.9 MG/250ML-% IV SOLN
30.0000 ug/min | INTRAVENOUS | Status: AC
  Administered 2022-08-24: 10 ug/min via INTRAVENOUS
  Filled 2022-08-23: qty 250

## 2022-08-23 MED ORDER — CHLORHEXIDINE GLUCONATE 0.12 % MT SOLN
15.0000 mL | Freq: Once | OROMUCOSAL | Status: AC
  Administered 2022-08-24: 15 mL via OROMUCOSAL
  Filled 2022-08-23: qty 15

## 2022-08-23 MED ORDER — TRANEXAMIC ACID (OHS) PUMP PRIME SOLUTION
2.0000 mg/kg | INTRAVENOUS | Status: DC
  Filled 2022-08-23: qty 1.74

## 2022-08-23 MED ORDER — TRANEXAMIC ACID (OHS) BOLUS VIA INFUSION
15.0000 mg/kg | INTRAVENOUS | Status: AC
  Administered 2022-08-24: 1129.5 mg via INTRAVENOUS
  Filled 2022-08-23: qty 1130

## 2022-08-23 MED ORDER — MILRINONE LACTATE IN DEXTROSE 20-5 MG/100ML-% IV SOLN
0.3000 ug/kg/min | INTRAVENOUS | Status: DC
  Filled 2022-08-23: qty 100

## 2022-08-23 NOTE — Progress Notes (Signed)
Mobility Specialist Progress Note:   08/23/22 0928  Mobility  Activity Ambulated with assistance in hallway  Level of Assistance Modified independent, requires aide device or extra time  Assistive Device None  Distance Ambulated (ft) 500 ft  Activity Response Tolerated well  $Mobility charge 1 Mobility   Pt received in chair willing to participate in mobility. No complaints of pain. Left in chair with call bell in reach and all needs met.   [facility] Surveyor, mining Chat only

## 2022-08-23 NOTE — Care Management Important Message (Signed)
Important Message  Patient Details  Name: Randy Myers MRN: [id number] Date of Birth: 21-Dec-1945   Medicare Important Message Given:  Yes     Shelda Altes 08/23/2022, 10:46 AM

## 2022-08-23 NOTE — Progress Notes (Signed)
CARDIAC REHAB PHASE I    Pt sitting in chair feeling well today. Anxious to have surgery and begin recovery process. Reviewed [facility] education provided. Questions and concerns addressed. Will continue to follow.   [id number]  Vanessa Barbara, RN BSN 08/23/2022 1:49 PM

## 2022-08-23 NOTE — Progress Notes (Signed)
ANTICOAGULATION CONSULT NOTE   Pharmacy Consult for heparin Indication: chest pain/ACS, s/p cath, awaiting CABG  No Known Allergies  Patient Measurements: Height: '5\' 11"'$  (180.3 cm) Weight: 86.9 kg (191 lb 9.3 oz) IBW/kg (Calculated) : 75.3 Heparin Dosing Weight: 89kg  Vital Signs: Temp: 97.9 F (36.6 C) (10/23 0410) Temp Source: Oral (10/23 0410) BP: 131/75 (10/23 0410) Pulse Rate: 58 (10/23 0410)  Labs: Recent Labs    08/21/22 0117 08/22/22 0057 08/22/22 1559 08/23/22 0110  HGB 13.8 14.3  --  14.4  HCT 41.0 42.0  --  41.7  PLT 108* 113*  --  103*  HEPARINUNFRC 0.36 0.29* 0.41 0.27*     Estimated Creatinine Clearance: 77.8 mL/min (by C-G formula based on SCr of 0.86 mg/dL).   Medical History: Past Medical History:  Diagnosis Date   Arthritis    Coronary artery disease    Hypertension    Migraine    Multiple vessel coronary artery disease 08/20/2022   Presented with unstable angina 08/19/2022: Cath -> ost-prox RCA 40%, prox-mid RCA 100% (~CTO w/ L-R collaterals); Ost-Prox LAD 40% prox-midLAD 99% (appears to be subtotally occluded at major SP branch.); Prox-mid LCx 70%, OM2 70%.  LVEF 25 to 35% (moderate to severely reduced function.  Severe HK of anterior wall 15 mmHg). ==> CABG, concern for PCI of the LAD could jeopardize large SP branch. => W   Prostate cancer [facility])    Prostate cancer [facility])    with radiation   Stroke ([place]) 06/10/2013   Stroke [facility])     Assessment: 76 year old male admitted after elective cath found multivessel CAD, patient awaiting surgical input. Patient does not appear to be on anticoagulation prior to admit, however he did take plavix PTA.  Heparin level slightly subtherapeutic at 0.27 on 1450 units/hr.  No overt bleeding or complications noted.  Goal of Therapy:  Heparin level 0.3-0.7 units/ml Monitor platelets by anticoagulation protocol: Yes   Plan:  Increase IV heparin slightly to 1550 units/hr. Daily heparin level and CBC while  on heparin Monitor for s/sx bleeding F/u plans for CABG on 10/24  [place] Crane, Roylene Reason, [facility] Clinical Pharmacist  08/23/2022 7:53 AM   [place] phone numbers are listed on [email or web address]

## 2022-08-23 NOTE — Progress Notes (Signed)
     [address]       [place] [address]             [phone]       OR tomorrow for CABG

## 2022-08-23 NOTE — Progress Notes (Signed)
Heart Failure Navigator Progress Note  Following this hospitalization to assess for HV TOC readiness.   EF 30-35% CABG 10/24  Earnestine Leys, BSN, RN Heart Failure Leisure centre manager Chat Only

## 2022-08-23 NOTE — Progress Notes (Signed)
Rounding Note    Patient Name: Randy Myers Date of Encounter: 08/23/2022  [place] Cardiologist: None   Subjective   No complaints.   Inpatient Medications    Scheduled Meds:  aspirin  81 mg Oral Daily   atorvastatin  80 mg Oral Daily   metoprolol succinate  12.5 mg Oral Daily   multivitamin with minerals  1 tablet Oral Daily   pantoprazole  40 mg Oral Daily   sodium chloride flush  3 mL Intravenous Q12H   spironolactone  25 mg Oral Daily   Continuous Infusions:  sodium chloride     heparin 1,550 Units/hr (08/23/22 0814)   PRN Meds: sodium chloride, acetaminophen, ondansetron (ZOFRAN) IV, oxyCODONE, sodium chloride flush   Vital Signs    Vitals:   08/23/22 0410 08/23/22 0530 08/23/22 0751 08/23/22 0815  BP: 131/75  105/70   Pulse: (!) 58   70  Resp: 16  17   Temp: 97.9 F (36.6 C)  97.6 F (36.4 C)   TempSrc: Oral  Oral   SpO2: 100%  100%   Weight:  86.9 kg    Height:        Intake/Output Summary (Last 24 hours) at 08/23/2022 1008 Last data filed at 08/23/2022 0810 Gross per 24 hour  Intake 534.35 ml  Output 400 ml  Net 134.35 ml      08/23/2022    5:30 AM 08/22/2022    5:00 AM 08/19/2022    5:55 AM  Last 3 Weights  Weight (lbs) 191 lb 9.3 oz 190 lb 11.2 oz 195 lb  Weight (kg) 86.9 kg 86.501 kg 88.451 kg      Telemetry    Sinus Rhythm - Personally Reviewed  ECG    No new tracing  Physical Exam   GEN: No acute distress.   Neck: No JVD Cardiac: RRR, no murmurs, rubs, or gallops.  Respiratory: Clear to auscultation bilaterally. GI: Soft, nontender, non-distended  MS: No edema; No deformity. Neuro:  Nonfocal  Psych: Normal affect   Labs    High Sensitivity Troponin:  No results for input(s): "TROPONINIHS" in the last 720 hours.   Chemistry Recent Labs  Lab 08/19/22 1535 08/20/22 0300  NA 140 139  K 3.7 3.9  CL 108 108  CO2 25 25  GLUCOSE 105* 111*  BUN 18 15  CREATININE 0.86 0.86  CALCIUM 8.9 8.8*   GFRNONAA >60 >60  ANIONGAP 7 6    Lipids  Recent Labs  Lab 08/20/22 0300  CHOL 132  TRIG 62  HDL 44  LDLCALC 76  CHOLHDL 3.0    Hematology Recent Labs  Lab 08/21/22 0117 08/22/22 0057 08/23/22 0110  WBC 5.2 5.0 4.9  RBC 4.54 4.67 4.63  HGB 13.8 14.3 14.4  HCT 41.0 42.0 41.7  MCV 90.3 89.9 90.1  MCH 30.4 30.6 31.1  MCHC 33.7 34.0 34.5  RDW 13.1 12.9 12.9  PLT 108* 113* 103*   Thyroid No results for input(s): "TSH", "FREET4" in the last 168 hours.  BNPNo results for input(s): "BNP", "PROBNP" in the last 168 hours.  DDimer No results for input(s): "DDIMER" in the last 168 hours.   Radiology    No results found.  Cardiac Studies   Cath: 08/19/22     Ost RCA to Prox RCA lesion is 40% stenosed.   Prox RCA to Mid RCA lesion is 100% stenosed.   Mid LM to Dist LM lesion is 40% stenosed.   Ost LAD to  Prox LAD lesion is 40% stenosed.   Prox LAD to Mid LAD lesion is 99% stenosed.   2nd Mrg lesion is 70% stenosed.   Prox Cx to Mid Cx lesion is 70% stenosed.   There is moderate to severe left ventricular systolic dysfunction.   LV end diastolic pressure is normal.   The left ventricular ejection fraction is 25-35% by visual estimate.   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 The mid and distal vessel fills briskly from left to right collaterals.  Severe segmental LV dysfunction with hypokinesis of the anterior wall. LVEDP 15 mmHg   Recommendations: I think the best strategy for revascularization will be bypass surgery. The LAD stenosis is a long segment and there is almost complete disconnection through the lesion. This also involves a large septal that supplies collaterals to the occluded RCA. PCI of the LAD could potentially disrupt collateral flow. LV gram suggests at least moderate LV systolic  dysfunction with severe hypokinesis of the anterior wall. Will admit to telemetry as he is having unstable symptoms at home. Will consult CT surgery for CABG. Will hold Plavix today and allow washout. Will start ASA and will start IV heparin 6 hours post sheath pull given the severity of the LAD disease. High intensity statin.     Diagnostic                     Dominance: Right    Echo: 08/19/22: Moderate to severely reduced LV function with a EF of 30 to 35%.  Distal anteroseptal and apical wall akinesis overall moderate to severe LV dysfunction.  Probable early apical thrombus noted using Definity.  Also Calcified aortic valve with partial fusion of noncoronary and left cusps but no AS by Doppler.  Mild concentric LVH with GR 1 DD.   Patient Profile     76 y.o. male with PMH of HTN, HLD, [place] who presented for outpatient cardiac cath and found to have multivessel disease.   Assessment & Plan    Unstable Angina CAD -- underwent cardiac cath noted above with severe 3v disease, sub-total occlusion of the mid LAD after large septal branch which supplies collaterals to the occluded RCA, severe mid Circumflex stenosis and CTO of the mRCA. Seen by TCTS and planned for CABG 10/24 after plavix washout.  -- remains on IV heparin, ASA, statin, Toprol, spiro   HFrEF ICM -- LVEF of 30-35%, normal RV size and function, akinesis of distal anteroseptal and apical walls, mild LVH, g1DD -- continue Toprol 12.'5mg'$  daily, spiro 12.'5mg'$  daily    HTN -- blood pressures stable -- continue Toprol 12.'5mg'$  daily and spiro 12.'5mg'$  daily    Possible early LV thrombus -- potential apical thrombus with using definity -- remains on IV heparin   HLD -- LDL 76, HDL 44, LP (a) 212 -- on high dose statin -- plan lipid clinic referral at discharge given elevated LP (a)  Hx of Stroke -- has been on plavix and statin -- plavix now held for washout  For questions or updates, please contact [place] Please consult [email or web address] for contact info under        Signed, Reino Bellis, NP  08/23/2022, 10:08 AM

## 2022-08-24 ENCOUNTER — Inpatient Hospital Stay (HOSPITAL_COMMUNITY): Payer: Medicare HMO

## 2022-08-24 ENCOUNTER — Inpatient Hospital Stay (HOSPITAL_COMMUNITY): Payer: Medicare HMO | Admitting: Certified Registered Nurse Anesthetist

## 2022-08-24 ENCOUNTER — Encounter (HOSPITAL_COMMUNITY): Payer: Self-pay | Admitting: Cardiovascular Disease

## 2022-08-24 ENCOUNTER — Inpatient Hospital Stay (HOSPITAL_COMMUNITY)
Admission: AD | Disposition: A | Discharge status: Home / Self Care | Payer: Self-pay | Source: Home / Self Care | Attending: Thoracic Surgery (Cardiothoracic Vascular Surgery)

## 2022-08-24 DIAGNOSIS — M1990 Unspecified osteoarthritis, unspecified site: Secondary | ICD-10-CM | POA: Diagnosis not present

## 2022-08-24 DIAGNOSIS — Z8673 Personal history of transient ischemic attack (TIA), and cerebral infarction without residual deficits: Secondary | ICD-10-CM | POA: Diagnosis not present

## 2022-08-24 DIAGNOSIS — I10 Essential (primary) hypertension: Secondary | ICD-10-CM

## 2022-08-24 DIAGNOSIS — I25119 Atherosclerotic heart disease of native coronary artery with unspecified angina pectoris: Secondary | ICD-10-CM | POA: Diagnosis not present

## 2022-08-24 DIAGNOSIS — Z951 Presence of aortocoronary bypass graft: Principal | ICD-10-CM

## 2022-08-24 DIAGNOSIS — I2510 Atherosclerotic heart disease of native coronary artery without angina pectoris: Secondary | ICD-10-CM

## 2022-08-24 HISTORY — PX: CORONARY ARTERY BYPASS GRAFT: SHX141

## 2022-08-24 HISTORY — PX: TEE WITHOUT CARDIOVERSION: SHX5443

## 2022-08-24 LAB — HEPARIN LEVEL (UNFRACTIONATED): Heparin Unfractionated: 0.59 IU/mL (ref 0.30–0.70)

## 2022-08-24 LAB — POCT I-STAT, CHEM 8
BUN: 19 mg/dL (ref 8–23)
BUN: 18 mg/dL (ref 8–23)
BUN: 18 mg/dL (ref 8–23)
BUN: 18 mg/dL (ref 8–23)
BUN: 19 mg/dL (ref 8–23)
Calcium, Ion: 1.25 mmol/L (ref 1.15–1.40)
Calcium, Ion: 1.2 mmol/L (ref 1.15–1.40)
Calcium, Ion: 1.11 mmol/L — ABNORMAL LOW (ref 1.15–1.40)
Calcium, Ion: 1.1 mmol/L — ABNORMAL LOW (ref 1.15–1.40)
Calcium, Ion: 1.19 mmol/L (ref 1.15–1.40)
Chloride: 103 mmol/L (ref 98–111)
Chloride: 104 mmol/L (ref 98–111)
Chloride: 102 mmol/L (ref 98–111)
Chloride: 102 mmol/L (ref 98–111)
Chloride: 102 mmol/L (ref 98–111)
Creatinine, Ser: 0.6 mg/dL — ABNORMAL LOW (ref 0.61–1.24)
Creatinine, Ser: 0.7 mg/dL (ref 0.61–1.24)
Creatinine, Ser: 0.7 mg/dL (ref 0.61–1.24)
Creatinine, Ser: 0.6 mg/dL — ABNORMAL LOW (ref 0.61–1.24)
Creatinine, Ser: 0.7 mg/dL (ref 0.61–1.24)
Glucose, Bld: 113 mg/dL — ABNORMAL HIGH (ref 70–99)
Glucose, Bld: 113 mg/dL — ABNORMAL HIGH (ref 70–99)
Glucose, Bld: 123 mg/dL — ABNORMAL HIGH (ref 70–99)
Glucose, Bld: 139 mg/dL — ABNORMAL HIGH (ref 70–99)
Glucose, Bld: 149 mg/dL — ABNORMAL HIGH (ref 70–99)
HCT: 42 % (ref 39.0–52.0)
HCT: 37 % — ABNORMAL LOW (ref 39.0–52.0)
HCT: 31 % — ABNORMAL LOW (ref 39.0–52.0)
HCT: 29 % — ABNORMAL LOW (ref 39.0–52.0)
HCT: 31 % — ABNORMAL LOW (ref 39.0–52.0)
Hemoglobin: 14.3 g/dL (ref 13.0–17.0)
Hemoglobin: 12.6 g/dL — ABNORMAL LOW (ref 13.0–17.0)
Hemoglobin: 10.5 g/dL — ABNORMAL LOW (ref 13.0–17.0)
Hemoglobin: 10.5 g/dL — ABNORMAL LOW (ref 13.0–17.0)
Hemoglobin: 9.9 g/dL — ABNORMAL LOW (ref 13.0–17.0)
Potassium: 3.9 mmol/L (ref 3.5–5.1)
Potassium: 4.1 mmol/L (ref 3.5–5.1)
Potassium: 4.5 mmol/L (ref 3.5–5.1)
Potassium: 4 mmol/L (ref 3.5–5.1)
Potassium: 4.5 mmol/L (ref 3.5–5.1)
Sodium: 140 mmol/L (ref 135–145)
Sodium: 139 mmol/L (ref 135–145)
Sodium: 138 mmol/L (ref 135–145)
Sodium: 136 mmol/L (ref 135–145)
Sodium: 138 mmol/L (ref 135–145)
TCO2: 25 mmol/L (ref 22–32)
TCO2: 25 mmol/L (ref 22–32)
TCO2: 28 mmol/L (ref 22–32)
TCO2: 25 mmol/L (ref 22–32)
TCO2: 28 mmol/L (ref 22–32)

## 2022-08-24 LAB — SARS CORONAVIRUS 2 BY RT PCR: SARS Coronavirus 2 by RT PCR: NEGATIVE

## 2022-08-24 LAB — POCT I-STAT 7, (LYTES, BLD GAS, ICA,H+H)
Acid-Base Excess: 1 mmol/L (ref 0.0–2.0)
Acid-Base Excess: 0 mmol/L (ref 0.0–2.0)
Acid-Base Excess: 2 mmol/L (ref 0.0–2.0)
Acid-base deficit: 2 mmol/L (ref 0.0–2.0)
Acid-base deficit: 5 mmol/L — ABNORMAL HIGH (ref 0.0–2.0)
Acid-base deficit: 3 mmol/L — ABNORMAL HIGH (ref 0.0–2.0)
Bicarbonate: 25.9 mmol/L (ref 20.0–28.0)
Bicarbonate: 24.5 mmol/L (ref 20.0–28.0)
Bicarbonate: 25.6 mmol/L (ref 20.0–28.0)
Bicarbonate: 22.8 mmol/L (ref 20.0–28.0)
Bicarbonate: 20.2 mmol/L (ref 20.0–28.0)
Bicarbonate: 21.9 mmol/L (ref 20.0–28.0)
Calcium, Ion: 1.24 mmol/L (ref 1.15–1.40)
Calcium, Ion: 0.96 mmol/L — ABNORMAL LOW (ref 1.15–1.40)
Calcium, Ion: 1.08 mmol/L — ABNORMAL LOW (ref 1.15–1.40)
Calcium, Ion: 1.26 mmol/L (ref 1.15–1.40)
Calcium, Ion: 1.17 mmol/L (ref 1.15–1.40)
Calcium, Ion: 1.21 mmol/L (ref 1.15–1.40)
HCT: 40 % (ref 39.0–52.0)
HCT: 28 % — ABNORMAL LOW (ref 39.0–52.0)
HCT: 29 % — ABNORMAL LOW (ref 39.0–52.0)
HCT: 27 % — ABNORMAL LOW (ref 39.0–52.0)
HCT: 25 % — ABNORMAL LOW (ref 39.0–52.0)
HCT: 30 % — ABNORMAL LOW (ref 39.0–52.0)
Hemoglobin: 13.6 g/dL (ref 13.0–17.0)
Hemoglobin: 9.5 g/dL — ABNORMAL LOW (ref 13.0–17.0)
Hemoglobin: 9.9 g/dL — ABNORMAL LOW (ref 13.0–17.0)
Hemoglobin: 9.2 g/dL — ABNORMAL LOW (ref 13.0–17.0)
Hemoglobin: 8.5 g/dL — ABNORMAL LOW (ref 13.0–17.0)
Hemoglobin: 10.2 g/dL — ABNORMAL LOW (ref 13.0–17.0)
O2 Saturation: 100 %
O2 Saturation: 100 %
O2 Saturation: 100 %
O2 Saturation: 100 %
O2 Saturation: 99 %
O2 Saturation: 100 %
Patient temperature: 36.4
Patient temperature: 98.6
Potassium: 3.9 mmol/L (ref 3.5–5.1)
Potassium: 4.5 mmol/L (ref 3.5–5.1)
Potassium: 4.8 mmol/L (ref 3.5–5.1)
Potassium: 4 mmol/L (ref 3.5–5.1)
Potassium: 3.3 mmol/L — ABNORMAL LOW (ref 3.5–5.1)
Potassium: 4.7 mmol/L (ref 3.5–5.1)
Sodium: 139 mmol/L (ref 135–145)
Sodium: 140 mmol/L (ref 135–145)
Sodium: 136 mmol/L (ref 135–145)
Sodium: 138 mmol/L (ref 135–145)
Sodium: 142 mmol/L (ref 135–145)
Sodium: 137 mmol/L (ref 135–145)
TCO2: 27 mmol/L (ref 22–32)
TCO2: 26 mmol/L (ref 22–32)
TCO2: 27 mmol/L (ref 22–32)
TCO2: 24 mmol/L (ref 22–32)
TCO2: 21 mmol/L — ABNORMAL LOW (ref 22–32)
TCO2: 23 mmol/L (ref 22–32)
pCO2 arterial: 39.8 mmHg (ref 32–48)
pCO2 arterial: 40 mmHg (ref 32–48)
pCO2 arterial: 36 mmHg (ref 32–48)
pCO2 arterial: 39.8 mmHg (ref 32–48)
pCO2 arterial: 36.7 mmHg (ref 32–48)
pCO2 arterial: 36.6 mmHg (ref 32–48)
pH, Arterial: 7.421 (ref 7.35–7.45)
pH, Arterial: 7.395 (ref 7.35–7.45)
pH, Arterial: 7.459 — ABNORMAL HIGH (ref 7.35–7.45)
pH, Arterial: 7.365 (ref 7.35–7.45)
pH, Arterial: 7.345 — ABNORMAL LOW (ref 7.35–7.45)
pH, Arterial: 7.384 (ref 7.35–7.45)
pO2, Arterial: 557 mmHg — ABNORMAL HIGH (ref 83–108)
pO2, Arterial: 354 mmHg — ABNORMAL HIGH (ref 83–108)
pO2, Arterial: 277 mmHg — ABNORMAL HIGH (ref 83–108)
pO2, Arterial: 412 mmHg — ABNORMAL HIGH (ref 83–108)
pO2, Arterial: 139 mmHg — ABNORMAL HIGH (ref 83–108)
pO2, Arterial: 173 mmHg — ABNORMAL HIGH (ref 83–108)

## 2022-08-24 LAB — CBC
HCT: 42 % (ref 39.0–52.0)
HCT: 29.2 % — ABNORMAL LOW (ref 39.0–52.0)
Hemoglobin: 14.5 g/dL (ref 13.0–17.0)
Hemoglobin: 9.9 g/dL — ABNORMAL LOW (ref 13.0–17.0)
MCH: 30.8 pg (ref 26.0–34.0)
MCH: 31 pg (ref 26.0–34.0)
MCHC: 34.5 g/dL (ref 30.0–36.0)
MCHC: 33.9 g/dL (ref 30.0–36.0)
MCV: 89.2 fL (ref 80.0–100.0)
MCV: 91.5 fL (ref 80.0–100.0)
Platelets: 113 10*3/uL — ABNORMAL LOW (ref 150–400)
Platelets: 62 10*3/uL — ABNORMAL LOW (ref 150–400)
RBC: 4.71 MIL/uL (ref 4.22–5.81)
RBC: 3.19 MIL/uL — ABNORMAL LOW (ref 4.22–5.81)
RDW: 12.7 % (ref 11.5–15.5)
RDW: 12.8 % (ref 11.5–15.5)
WBC: 4.7 10*3/uL (ref 4.0–10.5)
WBC: 5.8 10*3/uL (ref 4.0–10.5)
nRBC: 0 % (ref 0.0–0.2)
nRBC: 0 % (ref 0.0–0.2)

## 2022-08-24 LAB — BASIC METABOLIC PANEL
Anion gap: 7 (ref 5–15)
BUN: 20 mg/dL (ref 8–23)
CO2: 25 mmol/L (ref 22–32)
Calcium: 9.4 mg/dL (ref 8.9–10.3)
Chloride: 107 mmol/L (ref 98–111)
Creatinine, Ser: 1.01 mg/dL (ref 0.61–1.24)
GFR, Estimated: >60 mL/min (ref >60)
Glucose, Bld: 120 mg/dL — ABNORMAL HIGH (ref 70–99)
Potassium: 4.1 mmol/L (ref 3.5–5.1)
Sodium: 139 mmol/L (ref 135–145)

## 2022-08-24 LAB — POCT I-STAT EG7
Acid-base deficit: 1 mmol/L (ref 0.0–2.0)
Bicarbonate: 24.2 mmol/L (ref 20.0–28.0)
Calcium, Ion: 1.06 mmol/L — ABNORMAL LOW (ref 1.15–1.40)
HCT: 29 % — ABNORMAL LOW (ref 39.0–52.0)
Hemoglobin: 9.9 g/dL — ABNORMAL LOW (ref 13.0–17.0)
O2 Saturation: 79 %
Potassium: 3.9 mmol/L (ref 3.5–5.1)
Sodium: 140 mmol/L (ref 135–145)
TCO2: 25 mmol/L (ref 22–32)
pCO2, Ven: 39.7 mmHg — ABNORMAL LOW (ref 44–60)
pH, Ven: 7.393 (ref 7.25–7.43)
pO2, Ven: 44 mmHg (ref 32–45)

## 2022-08-24 LAB — APTT: aPTT: 34 seconds (ref 24–36)

## 2022-08-24 LAB — GLUCOSE, CAPILLARY
Glucose-Capillary: 103 mg/dL — ABNORMAL HIGH (ref 70–99)
Glucose-Capillary: 167 mg/dL — ABNORMAL HIGH (ref 70–99)
Glucose-Capillary: 137 mg/dL — ABNORMAL HIGH (ref 70–99)
Glucose-Capillary: 150 mg/dL — ABNORMAL HIGH (ref 70–99)
Glucose-Capillary: 132 mg/dL — ABNORMAL HIGH (ref 70–99)

## 2022-08-24 LAB — PLATELET COUNT: Platelets: 100 10*3/uL — ABNORMAL LOW (ref 150–400)

## 2022-08-24 LAB — PROTIME-INR
INR: 1.5 — ABNORMAL HIGH (ref 0.8–1.2)
Prothrombin Time: 17.8 seconds — ABNORMAL HIGH (ref 11.4–15.2)

## 2022-08-24 LAB — HEMOGLOBIN AND HEMATOCRIT, BLOOD
HCT: 30.8 % — ABNORMAL LOW (ref 39.0–52.0)
Hemoglobin: 10.8 g/dL — ABNORMAL LOW (ref 13.0–17.0)

## 2022-08-24 SURGERY — CORONARY ARTERY BYPASS GRAFTING (CABG)
Anesthesia: General | Site: Chest

## 2022-08-24 MED ORDER — MIDAZOLAM HCL 2 MG/2ML IJ SOLN: 2.0000 mg | INTRAMUSCULAR | Status: DC | PRN

## 2022-08-24 MED ORDER — VANCOMYCIN HCL IN DEXTROSE 1-5 GM/200ML-% IV SOLN
1000.0000 mg | Freq: Once | INTRAVENOUS | Status: AC
  Administered 2022-08-25: 1000 mg via INTRAVENOUS
  Filled 2022-08-24: qty 200

## 2022-08-24 MED ORDER — LACTATED RINGERS IV SOLN: INTRAVENOUS | Status: DC

## 2022-08-24 MED ORDER — EPHEDRINE SULFATE-NACL 50-0.9 MG/10ML-% IV SOSY
PREFILLED_SYRINGE | INTRAVENOUS | Status: DC | PRN
  Administered 2022-08-24: 5 mg via INTRAVENOUS

## 2022-08-24 MED ORDER — ASPIRIN 325 MG PO TBEC: 325.0000 mg | DELAYED_RELEASE_TABLET | Freq: Every day | ORAL | Status: DC

## 2022-08-24 MED ORDER — MIDAZOLAM HCL 2 MG/2ML IJ SOLN
INTRAMUSCULAR | Status: AC
  Administered 2022-08-24: 1.5 mg via INTRAVENOUS
  Filled 2022-08-24: qty 2

## 2022-08-24 MED ORDER — PROPOFOL 10 MG/ML IV BOLUS
INTRAVENOUS | Status: DC | PRN
  Administered 2022-08-24: 110 mg via INTRAVENOUS
  Administered 2022-08-24: 10 mg via INTRAVENOUS
  Administered 2022-08-24 (×2): 30 mg via INTRAVENOUS

## 2022-08-24 MED ORDER — DOBUTAMINE IN D5W 4-5 MG/ML-% IV SOLN: 0.0000 ug/kg/min | INTRAVENOUS | Status: DC

## 2022-08-24 MED ORDER — SODIUM CHLORIDE 0.45 % IV SOLN: INTRAVENOUS | Status: DC | PRN

## 2022-08-24 MED ORDER — FAMOTIDINE IN NACL 20-0.9 MG/50ML-% IV SOLN
20.0000 mg | Freq: Two times a day (BID) | INTRAVENOUS | Status: AC
  Administered 2022-08-24: 20 mg via INTRAVENOUS
  Filled 2022-08-24 (×2): qty 50

## 2022-08-24 MED ORDER — PLASMA-LYTE A IV SOLN: INTRAVENOUS | Status: DC | PRN

## 2022-08-24 MED ORDER — ACETAMINOPHEN 500 MG PO TABS
1000.0000 mg | ORAL_TABLET | Freq: Four times a day (QID) | ORAL | Status: AC
  Administered 2022-08-25 – 2022-08-29 (×17): 1000 mg via ORAL
  Filled 2022-08-24 (×18): qty 2

## 2022-08-24 MED ORDER — LACTATED RINGERS IV SOLN: INTRAVENOUS | Status: DC | PRN

## 2022-08-24 MED ORDER — BISACODYL 10 MG RE SUPP: 10.0000 mg | Freq: Every day | RECTAL | Status: DC

## 2022-08-24 MED ORDER — PHENYLEPHRINE 80 MCG/ML (10ML) SYRINGE FOR IV PUSH (FOR BLOOD PRESSURE SUPPORT)
PREFILLED_SYRINGE | INTRAVENOUS | Status: AC
  Filled 2022-08-24: qty 10

## 2022-08-24 MED ORDER — SODIUM CHLORIDE (PF) 0.9 % IJ SOLN: OROMUCOSAL | Status: DC | PRN

## 2022-08-24 MED ORDER — PROPOFOL 10 MG/ML IV BOLUS
INTRAVENOUS | Status: AC
  Filled 2022-08-24: qty 20

## 2022-08-24 MED ORDER — HEPARIN SODIUM (PORCINE) 1000 UNIT/ML IJ SOLN
INTRAMUSCULAR | Status: DC | PRN
  Administered 2022-08-24: 28000 [IU] via INTRAVENOUS

## 2022-08-24 MED ORDER — ASPIRIN 81 MG PO CHEW: 324.0000 mg | CHEWABLE_TABLET | Freq: Every day | ORAL | Status: DC

## 2022-08-24 MED ORDER — ORAL CARE MOUTH RINSE: 15.0000 mL | Freq: Once | OROMUCOSAL | Status: DC

## 2022-08-24 MED ORDER — FENTANYL CITRATE (PF) 250 MCG/5ML IJ SOLN
INTRAMUSCULAR | Status: AC
  Filled 2022-08-24: qty 5

## 2022-08-24 MED ORDER — PROTAMINE SULFATE 10 MG/ML IV SOLN
INTRAVENOUS | Status: DC | PRN
  Administered 2022-08-24: 280 mg via INTRAVENOUS

## 2022-08-24 MED ORDER — CHLORHEXIDINE GLUCONATE 0.12 % MT SOLN
15.0000 mL | OROMUCOSAL | Status: AC
  Administered 2022-08-24: 15 mL via OROMUCOSAL
  Filled 2022-08-24: qty 15

## 2022-08-24 MED ORDER — DEXTROSE 50 % IV SOLN: 0.0000 mL | INTRAVENOUS | Status: DC | PRN

## 2022-08-24 MED ORDER — FENTANYL CITRATE (PF) 100 MCG/2ML IJ SOLN: 50.0000 ug | Freq: Once | INTRAMUSCULAR | Status: AC

## 2022-08-24 MED ORDER — NITROGLYCERIN IN D5W 200-5 MCG/ML-% IV SOLN: 0.0000 ug/min | INTRAVENOUS | Status: DC

## 2022-08-24 MED ORDER — BISACODYL 5 MG PO TBEC
10.0000 mg | DELAYED_RELEASE_TABLET | Freq: Every day | ORAL | Status: DC
  Administered 2022-08-26 – 2022-09-02 (×7): 10 mg via ORAL
  Filled 2022-08-24 (×9): qty 2

## 2022-08-24 MED ORDER — ROCURONIUM BROMIDE 10 MG/ML (PF) SYRINGE
PREFILLED_SYRINGE | INTRAVENOUS | Status: AC
  Filled 2022-08-24: qty 10

## 2022-08-24 MED ORDER — NICARDIPINE HCL IN NACL 20-0.86 MG/200ML-% IV SOLN: 0.0000 mg/h | INTRAVENOUS | Status: DC

## 2022-08-24 MED ORDER — METOPROLOL TARTRATE 5 MG/5ML IV SOLN
2.5000 mg | INTRAVENOUS | Status: DC | PRN
  Administered 2022-08-26 – 2022-08-28 (×3): 5 mg via INTRAVENOUS
  Administered 2022-08-28: 2.5 mg via INTRAVENOUS
  Filled 2022-08-24 (×3): qty 5

## 2022-08-24 MED ORDER — MAGNESIUM SULFATE 4 GM/100ML IV SOLN
4.0000 g | Freq: Once | INTRAVENOUS | Status: AC
  Administered 2022-08-24: 4 g via INTRAVENOUS
  Filled 2022-08-24: qty 100

## 2022-08-24 MED ORDER — ACETAMINOPHEN 160 MG/5ML PO SOLN
1000.0000 mg | Freq: Four times a day (QID) | ORAL | Status: AC
  Filled 2022-08-24: qty 40.6

## 2022-08-24 MED ORDER — MIDAZOLAM HCL (PF) 5 MG/ML IJ SOLN
INTRAMUSCULAR | Status: DC | PRN
  Administered 2022-08-24: 2 mg via INTRAVENOUS
  Administered 2022-08-24: 1 mg via INTRAVENOUS
  Administered 2022-08-24: 6 mg via INTRAVENOUS

## 2022-08-24 MED ORDER — DOBUTAMINE INFUSION FOR EP/ECHO/NUC (1000 MCG/ML)
INTRAVENOUS | Status: DC | PRN
  Administered 2022-08-24: 2.5 ug/kg/min via INTRAVENOUS

## 2022-08-24 MED ORDER — METOPROLOL TARTRATE 12.5 MG HALF TABLET
12.5000 mg | ORAL_TABLET | Freq: Two times a day (BID) | ORAL | Status: DC
  Filled 2022-08-24 (×2): qty 1

## 2022-08-24 MED ORDER — PROTAMINE SULFATE 10 MG/ML IV SOLN
INTRAVENOUS | Status: AC
  Filled 2022-08-24: qty 20

## 2022-08-24 MED ORDER — PROTAMINE SULFATE 10 MG/ML IV SOLN
INTRAVENOUS | Status: AC
  Filled 2022-08-24: qty 25

## 2022-08-24 MED ORDER — METOPROLOL TARTRATE 25 MG/10 ML ORAL SUSPENSION: 12.5000 mg | Freq: Two times a day (BID) | ORAL | Status: DC

## 2022-08-24 MED ORDER — ROCURONIUM BROMIDE 10 MG/ML (PF) SYRINGE
PREFILLED_SYRINGE | INTRAVENOUS | Status: AC
  Filled 2022-08-24: qty 20

## 2022-08-24 MED ORDER — HEPARIN SODIUM (PORCINE) 1000 UNIT/ML IJ SOLN
INTRAMUSCULAR | Status: AC
  Filled 2022-08-24: qty 2

## 2022-08-24 MED ORDER — ALBUMIN HUMAN 5 % IV SOLN: INTRAVENOUS | Status: DC | PRN

## 2022-08-24 MED ORDER — HEPARIN SODIUM (PORCINE) 1000 UNIT/ML IJ SOLN
INTRAMUSCULAR | Status: AC
  Filled 2022-08-24: qty 10

## 2022-08-24 MED ORDER — PHENYLEPHRINE HCL-NACL 20-0.9 MG/250ML-% IV SOLN: 0.0000 ug/min | INTRAVENOUS | Status: DC

## 2022-08-24 MED ORDER — ALBUMIN HUMAN 5 % IV SOLN
250.0000 mL | INTRAVENOUS | Status: AC | PRN
  Administered 2022-08-24 (×3): 12.5 g via INTRAVENOUS
  Filled 2022-08-24 (×3): qty 250

## 2022-08-24 MED ORDER — DEXMEDETOMIDINE HCL IN NACL 400 MCG/100ML IV SOLN
0.0000 ug/kg/h | INTRAVENOUS | Status: DC
  Administered 2022-08-24: 0.7 ug/kg/h via INTRAVENOUS
  Filled 2022-08-24: qty 100

## 2022-08-24 MED ORDER — LACTATED RINGERS IV SOLN
500.0000 mL | Freq: Once | INTRAVENOUS | Status: AC | PRN
  Administered 2022-08-25: 500 mL via INTRAVENOUS

## 2022-08-24 MED ORDER — ONDANSETRON HCL 4 MG/2ML IJ SOLN
4.0000 mg | Freq: Four times a day (QID) | INTRAMUSCULAR | Status: DC | PRN
  Administered 2022-08-26: 4 mg via INTRAVENOUS
  Filled 2022-08-24: qty 2

## 2022-08-24 MED ORDER — CHLORHEXIDINE GLUCONATE 0.12 % MT SOLN
OROMUCOSAL | Status: AC
  Filled 2022-08-24: qty 15

## 2022-08-24 MED ORDER — ACETAMINOPHEN 650 MG RE SUPP
650.0000 mg | Freq: Once | RECTAL | Status: AC
  Administered 2022-08-24: 650 mg via RECTAL

## 2022-08-24 MED ORDER — PHENYLEPHRINE 80 MCG/ML (10ML) SYRINGE FOR IV PUSH (FOR BLOOD PRESSURE SUPPORT)
PREFILLED_SYRINGE | INTRAVENOUS | Status: DC | PRN
  Administered 2022-08-24 (×3): 80 ug via INTRAVENOUS
  Administered 2022-08-24 (×2): 40 ug via INTRAVENOUS

## 2022-08-24 MED ORDER — CHLORHEXIDINE GLUCONATE CLOTH 2 % EX PADS
6.0000 | MEDICATED_PAD | Freq: Every day | CUTANEOUS | Status: DC
  Administered 2022-08-24 – 2022-09-02 (×10): 6 via TOPICAL

## 2022-08-24 MED ORDER — OXYCODONE HCL 5 MG PO TABS
5.0000 mg | ORAL_TABLET | ORAL | Status: DC | PRN
  Administered 2022-08-25: 5 mg via ORAL
  Administered 2022-08-25 (×3): 10 mg via ORAL
  Administered 2022-08-26: 5 mg via ORAL
  Administered 2022-08-26 (×2): 10 mg via ORAL
  Administered 2022-08-27: 5 mg via ORAL
  Filled 2022-08-24 (×3): qty 2
  Filled 2022-08-24: qty 1
  Filled 2022-08-24: qty 2
  Filled 2022-08-24 (×2): qty 1
  Filled 2022-08-24: qty 2

## 2022-08-24 MED ORDER — EPHEDRINE 5 MG/ML INJ
INTRAVENOUS | Status: AC
  Filled 2022-08-24: qty 5

## 2022-08-24 MED ORDER — SODIUM CHLORIDE 0.9 % IV SOLN: 250.0000 mL | INTRAVENOUS | Status: DC

## 2022-08-24 MED ORDER — FENTANYL CITRATE (PF) 100 MCG/2ML IJ SOLN
INTRAMUSCULAR | Status: AC
  Administered 2022-08-24: 50 ug via INTRAVENOUS
  Filled 2022-08-24: qty 2

## 2022-08-24 MED ORDER — DOCUSATE SODIUM 100 MG PO CAPS
200.0000 mg | ORAL_CAPSULE | Freq: Every day | ORAL | Status: DC
  Administered 2022-08-26 – 2022-09-02 (×7): 200 mg via ORAL
  Filled 2022-08-24 (×9): qty 2

## 2022-08-24 MED ORDER — SODIUM CHLORIDE 0.9% FLUSH: 3.0000 mL | INTRAVENOUS | Status: DC | PRN

## 2022-08-24 MED ORDER — ACETAMINOPHEN 160 MG/5ML PO SOLN: 650.0000 mg | Freq: Once | ORAL | Status: AC

## 2022-08-24 MED ORDER — SODIUM CHLORIDE 0.9 % IV SOLN: INTRAVENOUS | Status: DC

## 2022-08-24 MED ORDER — SODIUM CHLORIDE 0.9 % IV SOLN: INTRAVENOUS | Status: DC | PRN

## 2022-08-24 MED ORDER — MORPHINE SULFATE (PF) 2 MG/ML IV SOLN
1.0000 mg | INTRAVENOUS | Status: DC | PRN
  Administered 2022-08-25: 4 mg via INTRAVENOUS
  Administered 2022-08-25: 2 mg via INTRAVENOUS
  Administered 2022-08-26: 4 mg via INTRAVENOUS
  Filled 2022-08-24 (×2): qty 2
  Filled 2022-08-24: qty 1

## 2022-08-24 MED ORDER — MIDAZOLAM HCL (PF) 10 MG/2ML IJ SOLN
INTRAMUSCULAR | Status: AC
  Filled 2022-08-24: qty 2

## 2022-08-24 MED ORDER — FENTANYL CITRATE (PF) 250 MCG/5ML IJ SOLN
INTRAMUSCULAR | Status: DC | PRN
  Administered 2022-08-24: 50 ug via INTRAVENOUS
  Administered 2022-08-24 (×2): 100 ug via INTRAVENOUS
  Administered 2022-08-24 (×2): 150 ug via INTRAVENOUS
  Administered 2022-08-24: 50 ug via INTRAVENOUS
  Administered 2022-08-24 (×3): 100 ug via INTRAVENOUS
  Administered 2022-08-24 (×2): 50 ug via INTRAVENOUS
  Administered 2022-08-24: 100 ug via INTRAVENOUS

## 2022-08-24 MED ORDER — CHLORHEXIDINE GLUCONATE 0.12 % MT SOLN: 15.0000 mL | Freq: Once | OROMUCOSAL | Status: DC

## 2022-08-24 MED ORDER — POTASSIUM CHLORIDE 10 MEQ/50ML IV SOLN
10.0000 meq | INTRAVENOUS | Status: AC
  Administered 2022-08-24 (×3): 10 meq via INTRAVENOUS
  Filled 2022-08-24 (×2): qty 50

## 2022-08-24 MED ORDER — 0.9 % SODIUM CHLORIDE (POUR BTL) OPTIME
TOPICAL | Status: DC | PRN
  Administered 2022-08-24: 5000 mL

## 2022-08-24 MED ORDER — SODIUM CHLORIDE 0.9% FLUSH
3.0000 mL | Freq: Two times a day (BID) | INTRAVENOUS | Status: DC
  Administered 2022-08-25 – 2022-09-01 (×15): 3 mL via INTRAVENOUS

## 2022-08-24 MED ORDER — MIDAZOLAM HCL 2 MG/2ML IJ SOLN: 1.5000 mg | Freq: Once | INTRAMUSCULAR | Status: AC

## 2022-08-24 MED ORDER — NOREPINEPHRINE 4 MG/250ML-% IV SOLN: 0.0000 ug/min | INTRAVENOUS | Status: DC

## 2022-08-24 MED ORDER — TRAMADOL HCL 50 MG PO TABS: 50.0000 mg | ORAL_TABLET | ORAL | Status: DC | PRN

## 2022-08-24 MED ORDER — INSULIN REGULAR(HUMAN) IN NACL 100-0.9 UT/100ML-% IV SOLN: INTRAVENOUS | Status: DC

## 2022-08-24 MED ORDER — PANTOPRAZOLE SODIUM 40 MG PO TBEC
40.0000 mg | DELAYED_RELEASE_TABLET | Freq: Every day | ORAL | Status: DC
  Administered 2022-08-26 – 2022-09-02 (×8): 40 mg via ORAL
  Filled 2022-08-24 (×8): qty 1

## 2022-08-24 MED ORDER — ROCURONIUM BROMIDE 10 MG/ML (PF) SYRINGE
PREFILLED_SYRINGE | INTRAVENOUS | Status: DC | PRN
  Administered 2022-08-24: 50 mg via INTRAVENOUS
  Administered 2022-08-24: 20 mg via INTRAVENOUS
  Administered 2022-08-24: 100 mg via INTRAVENOUS
  Administered 2022-08-24 (×3): 50 mg via INTRAVENOUS

## 2022-08-24 MED ORDER — CEFAZOLIN SODIUM-DEXTROSE 2-4 GM/100ML-% IV SOLN
2.0000 g | Freq: Three times a day (TID) | INTRAVENOUS | Status: AC
  Administered 2022-08-24 – 2022-08-26 (×6): 2 g via INTRAVENOUS
  Filled 2022-08-24 (×6): qty 100

## 2022-08-24 SURGICAL SUPPLY — 104 items
ADH SKN CLS APL DERMABOND .7 (GAUZE/BANDAGES/DRESSINGS) ×4
BAG DECANTER FOR FLEXI CONT (MISCELLANEOUS) ×2 IMPLANT
BLADE CLIPPER SURG (BLADE) ×2 IMPLANT
BLADE STERNUM SYSTEM 6 (BLADE) ×2 IMPLANT
BNDG ELASTIC 4X5.8 VLCR STR LF (GAUZE/BANDAGES/DRESSINGS) ×2 IMPLANT
BNDG ELASTIC 6X5.8 VLCR STR LF (GAUZE/BANDAGES/DRESSINGS) ×2 IMPLANT
BNDG GAUZE DERMACEA FLUFF 4 (GAUZE/BANDAGES/DRESSINGS) ×2 IMPLANT
BNDG GZE DERMACEA 4 6PLY (GAUZE/BANDAGES/DRESSINGS) ×2
CABLE SURGICAL S-101-97-12 (CABLE) ×2 IMPLANT
CANISTER SUCT 3000ML PPV (MISCELLANEOUS) ×2 IMPLANT
CANNULA MC2 2 STG 29/37 NON-V (CANNULA) ×2 IMPLANT
CANNULA MC2 TWO STAGE (CANNULA) ×4
CANNULA NON VENT 20FR 12 (CANNULA) ×2 IMPLANT
CANNULA NON VENT 22FR 12 (CANNULA) IMPLANT
CATH CPB KIT GERHARDT (MISCELLANEOUS) IMPLANT
CATH ROBINSON RED A/P 18FR (CATHETERS) ×4 IMPLANT
CLIP RETRACTION 3.0MM CORONARY (MISCELLANEOUS) ×2 IMPLANT
CLIP VESOCCLUDE MED 24/CT (CLIP) IMPLANT
CLIP VESOCCLUDE SM WIDE 24/CT (CLIP) IMPLANT
CONN ST 1/2X1/2  BEN (MISCELLANEOUS) ×2
CONN ST 1/2X1/2 BEN (MISCELLANEOUS) ×2 IMPLANT
CONNECTOR BLAKE 2:1 CARIO BLK (MISCELLANEOUS) ×2 IMPLANT
CONTAINER PROTECT SURGISLUSH (MISCELLANEOUS) ×4 IMPLANT
DERMABOND ADVANCED .7 DNX12 (GAUZE/BANDAGES/DRESSINGS) IMPLANT
DRAIN CHANNEL 19F RND (DRAIN) ×6 IMPLANT
DRAIN CONNECTOR BLAKE 1:1 (MISCELLANEOUS) ×2 IMPLANT
DRAPE CARDIOVASCULAR INCISE (DRAPES) ×2
DRAPE INCISE IOBAN 66X45 STRL (DRAPES) IMPLANT
DRAPE SLUSH MACHINE 52X66 (DRAPES) IMPLANT
DRAPE SRG 135X102X78XABS (DRAPES) ×2 IMPLANT
DRAPE WARM FLUID 44X44 (DRAPES) ×2 IMPLANT
DRESSING AQUACEL AG SP 3.5X10 (GAUZE/BANDAGES/DRESSINGS) IMPLANT
DRSG AQUACEL AG ADV 3.5X10 (GAUZE/BANDAGES/DRESSINGS) ×2 IMPLANT
DRSG AQUACEL AG SP 3.5X10 (GAUZE/BANDAGES/DRESSINGS) ×2
DRSG COVADERM 4X14 (GAUZE/BANDAGES/DRESSINGS) ×2 IMPLANT
ELECT BLADE 4.0 EZ CLEAN MEGAD (MISCELLANEOUS) ×2
ELECT REM PT RETURN 9FT ADLT (ELECTROSURGICAL) ×4
ELECTRODE BLDE 4.0 EZ CLN MEGD (MISCELLANEOUS) ×2 IMPLANT
ELECTRODE REM PT RTRN 9FT ADLT (ELECTROSURGICAL) ×4 IMPLANT
FELT TEFLON 1X6 (MISCELLANEOUS) ×4 IMPLANT
GAUZE 4X4 16PLY ~~LOC~~+RFID DBL (SPONGE) ×2 IMPLANT
GAUZE SPONGE 4X4 12PLY STRL (GAUZE/BANDAGES/DRESSINGS) ×4 IMPLANT
GAUZE SPONGE 4X4 12PLY STRL LF (GAUZE/BANDAGES/DRESSINGS) IMPLANT
GLOVE BIO SURGEON STRL SZ 6 (GLOVE) IMPLANT
GLOVE BIO SURGEON STRL SZ7 (GLOVE) ×4 IMPLANT
GLOVE BIO SURGEON STRL SZ7.5 (GLOVE) IMPLANT
GLOVE BIOGEL M STRL SZ7.5 (GLOVE) ×4 IMPLANT
GLOVE SS BIOGEL STRL SZ 7 (GLOVE) IMPLANT
GLOVE SS BIOGEL STRL SZ 7.5 (GLOVE) IMPLANT
GLOVE SURG SS PI 7.5 STRL IVOR (GLOVE) IMPLANT
GOWN STRL REUS W/ TWL LRG LVL3 (GOWN DISPOSABLE) ×8 IMPLANT
GOWN STRL REUS W/ TWL XL LVL3 (GOWN DISPOSABLE) ×4 IMPLANT
GOWN STRL REUS W/TWL LRG LVL3 (GOWN DISPOSABLE) ×8
GOWN STRL REUS W/TWL XL LVL3 (GOWN DISPOSABLE) ×8
HEMOSTAT POWDER SURGIFOAM 1G (HEMOSTASIS) ×6 IMPLANT
INSERT FOGARTY XLG (MISCELLANEOUS) IMPLANT
INSERT SUTURE HOLDER (MISCELLANEOUS) ×2 IMPLANT
KIT BASIN OR (CUSTOM PROCEDURE TRAY) ×2 IMPLANT
KIT SUCTION CATH 14FR (SUCTIONS) ×2 IMPLANT
KIT TURNOVER KIT B (KITS) ×2 IMPLANT
KIT VASOVIEW HEMOPRO 2 VH 4000 (KITS) ×2 IMPLANT
LEAD PACING MYOCARDI (MISCELLANEOUS) ×2 IMPLANT
LINE EXTENSION DELIVERY (MISCELLANEOUS) IMPLANT
LINE SUCTION ATS CATS PLUS (LINER) IMPLANT
MARKER GRAFT CORONARY BYPASS (MISCELLANEOUS) ×6 IMPLANT
NS IRRIG 1000ML POUR BTL (IV SOLUTION) ×10 IMPLANT
PACK ACCESSORY CANNULA KIT (KITS) ×2 IMPLANT
PACK E OPEN HEART (SUTURE) ×2 IMPLANT
PACK OPEN HEART (CUSTOM PROCEDURE TRAY) ×2 IMPLANT
PAD ARMBOARD 7.5X6 YLW CONV (MISCELLANEOUS) ×4 IMPLANT
PAD ELECT DEFIB RADIOL ZOLL (MISCELLANEOUS) ×2 IMPLANT
PENCIL BUTTON HOLSTER BLD 10FT (ELECTRODE) ×2 IMPLANT
POSITIONER HEAD DONUT 9IN (MISCELLANEOUS) ×2 IMPLANT
PUNCH AORTIC ROTATE 4.0MM (MISCELLANEOUS) ×2 IMPLANT
SET MPS 3-ND DEL (MISCELLANEOUS) IMPLANT
SPONGE T-LAP 18X18 ~~LOC~~+RFID (SPONGE) ×8 IMPLANT
SUPPORT HEART JANKE-BARRON (MISCELLANEOUS) ×2 IMPLANT
SUT BONE WAX W31G (SUTURE) ×2 IMPLANT
SUT ETHIBOND X763 2 0 SH 1 (SUTURE) ×4 IMPLANT
SUT MNCRL AB 3-0 PS2 18 (SUTURE) ×4 IMPLANT
SUT PDS AB 1 CTX 36 (SUTURE) ×4 IMPLANT
SUT PROLENE 4 0 RB 1 (SUTURE) ×8
SUT PROLENE 4 0 SH DA (SUTURE) ×2 IMPLANT
SUT PROLENE 4-0 RB1 .5 CRCL 36 (SUTURE) IMPLANT
SUT PROLENE 5 0 C 1 36 (SUTURE) ×6 IMPLANT
SUT PROLENE 7 0 BV 1 (SUTURE) IMPLANT
SUT PROLENE 7 0 BV1 MDA (SUTURE) ×2 IMPLANT
SUT STEEL 6MS V (SUTURE) ×4 IMPLANT
SUT VIC AB 1 CTX 36 (SUTURE) ×2
SUT VIC AB 1 CTX36XBRD ANBCTR (SUTURE) IMPLANT
SUT VIC AB 2-0 CT1 27 (SUTURE) ×2
SUT VIC AB 2-0 CT1 TAPERPNT 27 (SUTURE) IMPLANT
SUT VIC AB 2-0 CTX 27 (SUTURE) IMPLANT
SUT VIC AB 2-0 CTX 36 (SUTURE) IMPLANT
SUT VIC AB 3-0 X1 27 (SUTURE) IMPLANT
SYSTEM SAHARA CHEST DRAIN ATS (WOUND CARE) ×2 IMPLANT
TABLE PACK (MISCELLANEOUS) IMPLANT
TAPE CLOTH SURG 6X10 WHT LF (GAUZE/BANDAGES/DRESSINGS) IMPLANT
TOWEL GREEN STERILE (TOWEL DISPOSABLE) ×2 IMPLANT
TOWEL GREEN STERILE FF (TOWEL DISPOSABLE) ×2 IMPLANT
TRAY FOLEY SLVR 16FR TEMP STAT (SET/KITS/TRAYS/PACK) ×2 IMPLANT
TUBING LAP HI FLOW INSUFFLATIO (TUBING) ×2 IMPLANT
UNDERPAD 30X36 HEAVY ABSORB (UNDERPADS AND DIAPERS) ×2 IMPLANT
WATER STERILE IRR 1000ML POUR (IV SOLUTION) ×4 IMPLANT

## 2022-08-24 NOTE — Anesthesia Procedure Notes (Signed)
Central Venous Catheter Insertion Performed by: Belinda Block, MD, anesthesiologist Start/End10/24/2023 11:30 AM, 08/24/2022 11:50 AM Patient location: Pre-op. Preanesthetic checklist: patient identified, IV checked, site marked, risks and benefits discussed, surgical consent, monitors and equipment checked, pre-op evaluation, timeout performed and anesthesia consent Position: Trendelenburg Lidocaine 1% used for infiltration and patient sedated Hand hygiene performed  and maximum sterile barriers used  Catheter size: 8.5 Fr Sheath introducer Procedure performed using ultrasound guided technique. Ultrasound Notes:anatomy identified, needle tip was noted to be adjacent to the nerve/plexus identified, no ultrasound evidence of intravascular and/or intraneural injection and image(s) printed for medical record Attempts: 1 Following insertion, line sutured and dressing applied. Post procedure assessment: blood return through all ports, free fluid flow and no air  Patient tolerated the procedure well with no immediate complications. Additional procedure comments: Slick catheter placed without difficulty.

## 2022-08-24 NOTE — Hospital Course (Addendum)
  History of Present Illness: At time of CT surgical consultation  Randy Myers is a 76 yo obese male with history of CVA in 2015 on Plavix, Prostate Cancer S/P Radiation 2018 and continued hormonal treatment, Fall from 2020 with subsequent rib fractures, HTN, and HLD.  The patient presented to his Primary care physician Randy Myers on 08/11/2022 with complaints of episodic chest pressure with associated shortness of breath, nausea and vomiting after walking short distances.  Due to symptoms he was referred to Cardiology for evaluation.  He saw Dr. Marlou Porch on 10/12 at which time he admitted to chest pain and shortness of breath that had been progressing over the past 2 weeks.  These episodes occur intermittently but at times is so severe he experiences retching.  He has also been experiencing intermittent LE edema.  He denied palpitations, lightheadedness, syncope, or orthopnea.  EKG was obtained and showed NSR with non-specific ST changes with inferior infarct pattern and poor R wave progression.  It was recommended the patient undergo cardiac catheterization with possible PCI.  The patient was agreeable to proceed.  He presented to [facility] on 08/19/2022 for elective cardiac catheterization.  This showed a significantly reduced EF of 25-35% and multivessel CAD.  It was felt the patient would best be treated with revascularization and Cardiothoracic surgery consultation has been requested.  Echocardiogram has been ordered.  Currently the patient is chest pain free.  His last dose of Plavix was taken this morning.  He has never smoked.  Prior to admission he remained physically active.  He has no family history of CAD.  He would like to go home prior to surgery to get some things taken care of.  I explained to the patient that this will be up to Cardiology.  Hospital course:  Following diagnostic evaluation and medical stabilization the patient was felt to be a good candidate to proceed with  coronary artery bypass grafting.  On 08/24/2022 he was taken the operating room at which time he underwent the following procedure coronary artery bypass grafting x3.  The patient had a graft from the LIMA-LAD, saphenous vein graft-OM, and saphenous vein graft-PDA.  He tolerated the procedure well was taken to the surgical intensive care unit in stable condition.  Postoperative hospital course:  Patient was extubated using standard post cardiac surgical protocols without difficulty.  He has maintained stable hemodynamics and did not require inotropic or vasopressor support.  All routine lines, monitors and drainage devices have been discontinued in the standard fashion.  Following pacer wire removal Plavix was initiated.  He has initially been started on aspirin, statin and beta-blocker. He went into a fib with RVR. He was put on Amiodarone. He was transitioned off the Insulin drip. His pre op HGA1C is 5.4. Oxygen has been weaned and he maintains good saturations on room air.  Blood sugars have been under good control using standard protocols.  He is maintaining normal renal function with good urine output.  He was given a short course of diuretics.  He does have an expected acute blood loss anemia which has been equilibrium eating and is not in the transfusion threshold.  He has a postoperative thrombocytopenia that is being monitored clinically. Accu checks and sliding scale PRN were stopped after transfer. He was surgically stable for transfer from the ICU to 10/2*.

## 2022-08-24 NOTE — Anesthesia Preprocedure Evaluation (Signed)
Anesthesia Evaluation  Patient identified by MRN, date of birth, ID band Patient awake    Reviewed: Allergy & Precautions, NPO status   Airway Mallampati: II       Dental   Pulmonary    breath sounds clear to auscultation       Cardiovascular hypertension, + angina + CAD   Rhythm:Regular Rate:Normal  History noted Dr. Nyoka Cowden   Neuro/Psych  Headaches, TIACVA    GI/Hepatic negative GI ROS, Neg liver ROS,   Endo/Other  negative endocrine ROS  Renal/GU negative Renal ROS     Musculoskeletal  (+) Arthritis ,   Abdominal   Peds  Hematology   Anesthesia Other Findings   Reproductive/Obstetrics                             Anesthesia Physical Anesthesia Plan  ASA: 3  Anesthesia Plan: General   Post-op Pain Management:    Induction: Intravenous  PONV Risk Score and Plan: 3 and Ondansetron, Dexamethasone and Midazolam  Airway Management Planned: Oral ETT  Additional Equipment: Arterial line, Ultrasound Guidance Line Placement and CVP  Intra-op Plan:   Post-operative Plan: Post-operative intubation/ventilation  Informed Consent: I have reviewed the patients History and Physical, chart, labs and discussed the procedure including the risks, benefits and alternatives for the proposed anesthesia with the patient or authorized representative who has indicated his/her understanding and acceptance.     Dental advisory given  Plan Discussed with: CRNA and Anesthesiologist  Anesthesia Plan Comments:         Anesthesia Quick Evaluation

## 2022-08-24 NOTE — Op Note (Signed)
[address]       [place] [address]             [phone]                                          08/24/2022 Patient:  Randy Myers Pre-Op Dx: 3V CAD HTN   Post-op Dx:  Same Procedure: CABG X 3, LIMA LAD, RSVG PDA, OM   Endoscopic greater saphenous vein harvest on the right   Surgeon and Role:      * Latyra Jaye, Lucile Crater, MD - Primary    Evonnie Pat , PA-C - assisting An experienced assistant was required given the complexity of this surgery and the standard of surgical care. The assistant was needed for exposure, dissection, suctioning, retraction of delicate tissues and sutures, instrument exchange and for overall help during this procedure.    Anesthesia  general EBL:  573m Blood Administration: none Xclamp Time:  44 min  Drains: 126F blake drain: R, L, mediastinal  Wires: ventricular Counts: correct   Indications: 76year old male with three-vessel coronary artery disease, reduced left ventricular function with EF 30 to 35%.  There is no significant valvular disease, the right ventricle function is normal.  On review of his left heart catheterization he has good target on all walls.  The LAD fills late but appears intramyocardial.  The PDA fills from left to right collaterals.  And he has a tight lesion on the obtuse marginal.  We discussed the risk and benefits of surgical revascularization he is agreeable to proceed.  He will need a Plavix washout prior to surgery.  Findings: Good LIMA and vein.  Good LAD, and PDA, intramyocardial OM  Operative Technique: All invasive lines were placed in pre-op holding.  After the risks, benefits and alternatives were thoroughly discussed, the patient was brought to the operative theatre.  Anesthesia was induced, and the patient was prepped and draped in normal sterile fashion.  An appropriate surgical pause was performed, and pre-operative antibiotics were dosed accordingly.  We began with simultaneous  incisions along the right leg for harvesting of the greater saphenous vein and the chest for the sternotomy.  In regards to the sternotomy, this was carried down with bovie cautery, and the sternum was divided with a reciprocating saw.  Meticulous hemostasis was obtained.  The left internal thoracic artery was exposed and harvested in in pedicled fashion.  The patient was systemically heparinized, and the artery was divided distally, and placed in a papaverine sponge.    The sternal elevator was removed, and a retractor was placed.  The pericardium was divided in the midline and fashioned into a cradle with pericardial stitches.   After we confirmed an appropriate ACT, the ascending aorta was cannulated in standard fashion.  The right atrial appendage was used for venous cannulation site.  Cardiopulmonary bypass was initiated, and the heart retractor was placed. The Riehle clamp was applied, and a dose of anterograde cardioplegia was given with good arrest of the heart.  We moved to the posterior wall of the heart, and found a good target on the PDA.  An arteriotomy was made, and the vein graft was anastomosed to it in an end to side fashion.  Next we exposed the lateral wall, and found a good target on the OM.  An end to  side anastomosis with the vein graft was then created.  Finally, we exposed a good target on the LAD, and fashioned an end to side anastomosis between it and the LITA.  We began to re-warm, and a re-animation dose of cardioplegia was given.  The heart was de-aired, and the Howson clamp was removed.  Meticulous hemostasis was obtained.    A partial occludding clamp was then placed on the ascending aorta, and we created an end to side anastomosis between it and the proximal vein grafts.  Rings were placed on the proximal anastomosis.  Hemostasis was obtained, and we separated from cardiopulmonary bypass without event.  The heparin was reversed with protamine.  Chest tubes and wires were placed, and  the sternum was re-approximated with sternal wires.  The soft tissue and skin were re-approximated wth absorbable suture.    The patient tolerated the procedure without any immediate complications, and was transferred to the ICU in guarded condition.  Anakin Varkey Bary Leriche

## 2022-08-24 NOTE — Progress Notes (Signed)
Mobility Specialist Progress Note   08/24/22 1029  Mobility  Activity Contraindicated/medical hold   Patient going for surgery, MS to hold and continue to follow.  Randy Myers, [place], [place]  Office: [phone]

## 2022-08-24 NOTE — Procedures (Signed)
Extubation Procedure Note  Patient Details:   Name: Randy Myers DOB: 04/17/1946 MRN: [id number]   Airway Documentation:  Airway 8 mm (Active)  Secured at (cm) 25 cm 08/24/22 2259  Measured From Lips 08/24/22 2259  Secured Location Right 08/24/22 2259  Secured By Pink Tape 08/24/22 2259  Cuff Pressure (cm H2O) Clear OR 27-39 CmH2O 08/24/22 2259  Site Condition Dry 08/24/22 2259   Vent end date: 08/24/22 Vent end time: 2332   Evaluation  O2 sats: stable throughout Complications: No apparent complications Patient did tolerate procedure well. Bilateral Breath Sounds: Clear, Diminished   Yes  NIF= - 20 VC=.700. No stridor heard over upper airway.Placed patient on [place] at 5lpm    Ulice Dash 08/24/2022, 11:34 PM

## 2022-08-24 NOTE — Anesthesia Procedure Notes (Addendum)
Procedure Name: Intubation Date/Time: 08/24/2022 1:10 PM  Performed by: Erick Colace, CRNAPre-anesthesia Checklist: Patient identified, Emergency Drugs available, Suction available and Patient being monitored Patient Re-evaluated:Patient Re-evaluated prior to induction Oxygen Delivery Method: Circle system utilized Preoxygenation: Pre-oxygenation with 100% oxygen Induction Type: IV induction Ventilation: Mask ventilation without difficulty and Oral airway inserted - appropriate to patient size Laryngoscope Size: Mac and 4 Grade View: Grade I Tube type: Oral Tube size: 8.0 mm Number of attempts: 1 Airway Equipment and Method: Stylet and Oral airway Placement Confirmation: ETT inserted through vocal cords under direct vision, positive ETCO2 and breath sounds checked- equal and bilateral Secured at: 24 cm Tube secured with: Tape Dental Injury: Teeth and Oropharynx as per pre-operative assessment

## 2022-08-24 NOTE — Progress Notes (Signed)
     [address]       [place] [address]             [phone]       No events  Vitals:   08/23/22 2327 08/24/22 0427  BP: 131/73 130/66  Pulse: 63 67  Resp: 20 20  Temp: 97.6 F (36.4 C) 97.8 F (36.6 C)  SpO2: 100% 100%   Alert NAD Sinus  EWOB  OR today for CABG  Randy Myers

## 2022-08-24 NOTE — Transfer of Care (Signed)
Immediate Anesthesia Transfer of Care Note  Patient: Randy Myers  Procedure(s) Performed: CORONARY ARTERY BYPASS GRAFTING (CABG) x3, USING LEFT INTERNAL MAMMARY ARTERY AND ENDOSCOPICALLY HARVESTED RIGHT GREATER SAPHENOUS VEIN (Chest) TRANSESOPHAGEAL ECHOCARDIOGRAM (TEE)  Patient Location: ICU  Anesthesia Type:General  Level of Consciousness: Patient remains intubated per anesthesia plan  Airway & Oxygen Therapy: Patient remains intubated per anesthesia plan and Patient placed on Ventilator (see vital sign flow sheet for setting)  Post-op Assessment: Report given to RN and Post -op Vital signs reviewed and stable  Post vital signs: Reviewed and stable  Last Vitals:  Vitals Value Taken Time  BP 101/64 08/24/22 1815  Temp    Pulse 65 08/24/22 1817  Resp 15 08/24/22 1817  SpO2 100 % 08/24/22 1817  Vitals shown include unvalidated device data.  Last Pain:  Vitals:   08/24/22 1200  TempSrc:   PainSc: 0-No pain         Complications: No notable events documented.

## 2022-08-24 NOTE — Progress Notes (Signed)
CT surgery p.m. rounds  Patient getting checked in to ICU after CABG x3. Hemodynamics are stable on minimal inotropic support Heart rate 68 sinus Postoperative chest x-ray is clear Postoperative labs-hemoglobin 9.9 platelet count 62 K, creatinine 0.6 Minimal chest tube output  Blood pressure 117/73, pulse 61, temperature (!) 97.5 F (36.4 C), temperature source Core, resp. rate 12, height '5\' 11"'$  (1.803 m), weight 89.4 kg, SpO2 100 %.

## 2022-08-24 NOTE — Anesthesia Procedure Notes (Signed)
Arterial Line Insertion Performed by: Valda Favia, CRNA, CRNA  Patient location: Pre-op. Preanesthetic checklist: patient identified, IV checked, site marked, risks and benefits discussed, surgical consent, monitors and equipment checked, pre-op evaluation, timeout performed and anesthesia consent Lidocaine 1% used for infiltration Right, radial was placed Catheter size: 20 G Hand hygiene performed  and maximum sterile barriers used  Allen's test indicative of satisfactory collateral circulation Attempts: 1 Procedure performed without using ultrasound guided technique. Following insertion, dressing applied and Biopatch. Post procedure assessment: normal  Patient tolerated the procedure well with no immediate complications.

## 2022-08-24 NOTE — Brief Op Note (Signed)
08/19/2022 - 08/24/2022  3:40 PM  PATIENT:  Randy Myers  76 y.o. male  PRE-OPERATIVE DIAGNOSIS:  Coronary Artery Disease  POST-OPERATIVE DIAGNOSIS:  Coronary Artery Disease  PROCEDURE:  Procedure(s): CORONARY ARTERY BYPASS GRAFTING (CABG) x 3, USING LEFT INTERNAL MAMMARY ARTERY AND ENDOSCOPICALLY HARVESTED RIGHT GREATER SAPHENOUS VEIN   LIMA-LAD SVG-OM SVG-RCA  TRANSESOPHAGEAL ECHOCARDIOGRAM  Vein harvest time: 77mn Vein prep time: 123m  SURGEON: [place], HaLucile CraterMD - Primary  PHYSICIAN ASSISTANT: WAYNE GOLD PA-C, Eldor Conaway PA-C  ASSISTANTS: RNFA STAFF   ANESTHESIA:  GENERAL  EBL: 1507mBLOOD ADMINISTERED:none  DRAINS:  LEFT PLEURAL AND MEDIASTINAL CHEST TUBES    LOCAL MEDICATIONS USED:  NONE  SPECIMEN:  No Specimen  DISPOSITION OF SPECIMEN:  N/A  COUNTS:  YES  DICTATION: .Dragon Dictation  PLAN OF CARE: Admit to inpatient   PATIENT DISPOSITION:  ICU - intubated and hemodynamically stable.   Delay start of Pharmacological VTE agent (>24hrs) due to surgical blood loss or risk of bleeding: yes  COMPLICATIONS: NO KNOWN

## 2022-08-25 ENCOUNTER — Encounter (HOSPITAL_COMMUNITY): Payer: Self-pay | Admitting: Thoracic Surgery (Cardiothoracic Vascular Surgery)

## 2022-08-25 ENCOUNTER — Inpatient Hospital Stay (HOSPITAL_COMMUNITY): Payer: Medicare HMO

## 2022-08-25 LAB — CBC
HCT: 30.9 % — ABNORMAL LOW (ref 39.0–52.0)
HCT: 31.8 % — ABNORMAL LOW (ref 39.0–52.0)
HCT: 30.7 % — ABNORMAL LOW (ref 39.0–52.0)
Hemoglobin: 10.6 g/dL — ABNORMAL LOW (ref 13.0–17.0)
Hemoglobin: 10.5 g/dL — ABNORMAL LOW (ref 13.0–17.0)
Hemoglobin: 10.5 g/dL — ABNORMAL LOW (ref 13.0–17.0)
MCH: 31.4 pg (ref 26.0–34.0)
MCH: 30.5 pg (ref 26.0–34.0)
MCH: 31.9 pg (ref 26.0–34.0)
MCHC: 34.3 g/dL (ref 30.0–36.0)
MCHC: 33 g/dL (ref 30.0–36.0)
MCHC: 34.2 g/dL (ref 30.0–36.0)
MCV: 91.4 fL (ref 80.0–100.0)
MCV: 92.4 fL (ref 80.0–100.0)
MCV: 93.3 fL (ref 80.0–100.0)
Platelets: 81 10*3/uL — ABNORMAL LOW (ref 150–400)
Platelets: 92 10*3/uL — ABNORMAL LOW (ref 150–400)
Platelets: 87 10*3/uL — ABNORMAL LOW (ref 150–400)
RBC: 3.38 MIL/uL — ABNORMAL LOW (ref 4.22–5.81)
RBC: 3.44 MIL/uL — ABNORMAL LOW (ref 4.22–5.81)
RBC: 3.29 MIL/uL — ABNORMAL LOW (ref 4.22–5.81)
RDW: 13.1 % (ref 11.5–15.5)
RDW: 13.3 % (ref 11.5–15.5)
RDW: 13.3 % (ref 11.5–15.5)
WBC: 7.9 10*3/uL (ref 4.0–10.5)
WBC: 8.2 10*3/uL (ref 4.0–10.5)
WBC: 8 10*3/uL (ref 4.0–10.5)
nRBC: 0 % (ref 0.0–0.2)
nRBC: 0 % (ref 0.0–0.2)
nRBC: 0 % (ref 0.0–0.2)

## 2022-08-25 LAB — POCT I-STAT 7, (LYTES, BLD GAS, ICA,H+H)
Acid-base deficit: 2 mmol/L (ref 0.0–2.0)
Bicarbonate: 22.7 mmol/L (ref 20.0–28.0)
Calcium, Ion: 1.21 mmol/L (ref 1.15–1.40)
HCT: 30 % — ABNORMAL LOW (ref 39.0–52.0)
Hemoglobin: 10.2 g/dL — ABNORMAL LOW (ref 13.0–17.0)
O2 Saturation: 99 %
Patient temperature: 37
Potassium: 4.4 mmol/L (ref 3.5–5.1)
Sodium: 138 mmol/L (ref 135–145)
TCO2: 24 mmol/L (ref 22–32)
pCO2 arterial: 39.2 mmHg (ref 32–48)
pH, Arterial: 7.37 (ref 7.35–7.45)
pO2, Arterial: 167 mmHg — ABNORMAL HIGH (ref 83–108)

## 2022-08-25 LAB — BASIC METABOLIC PANEL
Anion gap: 8 (ref 5–15)
Anion gap: 6 (ref 5–15)
BUN: 16 mg/dL (ref 8–23)
BUN: 17 mg/dL (ref 8–23)
CO2: 22 mmol/L (ref 22–32)
CO2: 24 mmol/L (ref 22–32)
Calcium: 8.3 mg/dL — ABNORMAL LOW (ref 8.9–10.3)
Calcium: 8.3 mg/dL — ABNORMAL LOW (ref 8.9–10.3)
Chloride: 107 mmol/L (ref 98–111)
Chloride: 106 mmol/L (ref 98–111)
Creatinine, Ser: 0.89 mg/dL (ref 0.61–1.24)
Creatinine, Ser: 1.08 mg/dL (ref 0.61–1.24)
GFR, Estimated: >60 mL/min (ref >60)
GFR, Estimated: >60 mL/min (ref >60)
Glucose, Bld: 151 mg/dL — ABNORMAL HIGH (ref 70–99)
Glucose, Bld: 188 mg/dL — ABNORMAL HIGH (ref 70–99)
Potassium: 4.3 mmol/L (ref 3.5–5.1)
Potassium: 3.9 mmol/L (ref 3.5–5.1)
Sodium: 137 mmol/L (ref 135–145)
Sodium: 136 mmol/L (ref 135–145)

## 2022-08-25 LAB — GLUCOSE, CAPILLARY
Glucose-Capillary: 106 mg/dL — ABNORMAL HIGH (ref 70–99)
Glucose-Capillary: 108 mg/dL — ABNORMAL HIGH (ref 70–99)
Glucose-Capillary: 114 mg/dL — ABNORMAL HIGH (ref 70–99)
Glucose-Capillary: 125 mg/dL — ABNORMAL HIGH (ref 70–99)
Glucose-Capillary: 125 mg/dL — ABNORMAL HIGH (ref 70–99)
Glucose-Capillary: 136 mg/dL — ABNORMAL HIGH (ref 70–99)
Glucose-Capillary: 155 mg/dL — ABNORMAL HIGH (ref 70–99)
Glucose-Capillary: 160 mg/dL — ABNORMAL HIGH (ref 70–99)
Glucose-Capillary: 164 mg/dL — ABNORMAL HIGH (ref 70–99)
Glucose-Capillary: 171 mg/dL — ABNORMAL HIGH (ref 70–99)
Glucose-Capillary: 179 mg/dL — ABNORMAL HIGH (ref 70–99)
Glucose-Capillary: 190 mg/dL — ABNORMAL HIGH (ref 70–99)
Glucose-Capillary: 98 mg/dL (ref 70–99)

## 2022-08-25 LAB — CREATININE, SERUM
Creatinine, Ser: 1.1 mg/dL (ref 0.61–1.24)
GFR, Estimated: >60 mL/min (ref >60)

## 2022-08-25 LAB — MAGNESIUM
Magnesium: 2.5 mg/dL — ABNORMAL HIGH (ref 1.7–2.4)
Magnesium: 2 mg/dL (ref 1.7–2.4)

## 2022-08-25 LAB — ECHO INTRAOPERATIVE TEE
Height: 71 in
Weight: 3152 oz

## 2022-08-25 MED ORDER — ASPIRIN 81 MG PO TBEC
81.0000 mg | DELAYED_RELEASE_TABLET | Freq: Every day | ORAL | Status: DC
  Administered 2022-08-25 – 2022-08-29 (×5): 81 mg via ORAL
  Filled 2022-08-25 (×5): qty 1

## 2022-08-25 MED ORDER — ASPIRIN 81 MG PO CHEW: 81.0000 mg | CHEWABLE_TABLET | Freq: Every day | ORAL | Status: DC

## 2022-08-25 MED ORDER — CLOPIDOGREL BISULFATE 75 MG PO TABS
75.0000 mg | ORAL_TABLET | Freq: Every day | ORAL | Status: DC
  Administered 2022-08-25 – 2022-08-29 (×5): 75 mg via ORAL
  Filled 2022-08-25 (×5): qty 1

## 2022-08-25 MED ORDER — INSULIN ASPART 100 UNIT/ML IJ SOLN
0.0000 [IU] | INTRAMUSCULAR | Status: DC
  Administered 2022-08-25: 2 [IU] via SUBCUTANEOUS

## 2022-08-25 MED ORDER — INSULIN ASPART 100 UNIT/ML IJ SOLN
0.0000 [IU] | Freq: Three times a day (TID) | INTRAMUSCULAR | Status: DC
  Administered 2022-08-25: 2 [IU] via SUBCUTANEOUS
  Administered 2022-08-26: 4 [IU] via SUBCUTANEOUS
  Administered 2022-08-26 – 2022-08-28 (×5): 2 [IU] via SUBCUTANEOUS
  Administered 2022-08-30: 4 [IU] via SUBCUTANEOUS
  Administered 2022-08-31 – 2022-09-01 (×3): 2 [IU] via SUBCUTANEOUS

## 2022-08-25 MED ORDER — ENOXAPARIN SODIUM 30 MG/0.3ML IJ SOSY
30.0000 mg | PREFILLED_SYRINGE | Freq: Every day | INTRAMUSCULAR | Status: DC
  Administered 2022-08-25 – 2022-08-28 (×4): 30 mg via SUBCUTANEOUS
  Filled 2022-08-25 (×6): qty 0.3

## 2022-08-25 MED ORDER — CARVEDILOL 3.125 MG PO TABS
3.1250 mg | ORAL_TABLET | Freq: Two times a day (BID) | ORAL | Status: DC
  Administered 2022-08-25 – 2022-08-29 (×9): 3.125 mg via ORAL
  Filled 2022-08-25 (×10): qty 1

## 2022-08-25 NOTE — Progress Notes (Signed)
Heart Failure Navigator Progress Note  Following this hospitalization to assess for HV TOC readiness.   EF 30-35% G1DD CABG 08/24/22  Earnestine Leys, BSN, RN Heart Failure Transport planner Only

## 2022-08-25 NOTE — Progress Notes (Signed)
Epicardial pacing wires removed today at 1745. Patient remained on bed rest for one hour and had no complications. Patient off vasoactive drips and insulin drip. Arterial line removed. Foley removed today at 1845 and patient is due to void. Urine output during day shift today 510 mL. Chest tube output during day shift today 170 mL.

## 2022-08-25 NOTE — Progress Notes (Signed)
      [address]       [place] [address]             [phone]                 1 Day Post-Op Procedure(s) (LRB): CORONARY ARTERY BYPASS GRAFTING (CABG) x3, USING LEFT INTERNAL MAMMARY ARTERY AND ENDOSCOPICALLY HARVESTED RIGHT GREATER SAPHENOUS VEIN (N/A) TRANSESOPHAGEAL ECHOCARDIOGRAM (TEE) (N/A)   Events: No events extubated _______________________________________________________________ Vitals: BP 102/78   Pulse 74   Temp 99.1 F (37.3 C)   Resp (!) 21   Ht '5\' 11"'$  (1.803 m)   Wt 91.2 kg   SpO2 98%   BMI 28.04 kg/m  Filed Weights   08/24/22 0427 08/24/22 1035 08/25/22 0500  Weight: 86.5 kg 89.4 kg 91.2 kg     - Neuro: alert NAD  - Cardiovascular: sinus  Drips: none.   CVP:  [0 mmHg-9 mmHg] 5 mmHg  - Pulm: EWOB  ABG    Component Value Date/Time   PHART 7.370 08/25/2022 0132   PCO2ART 39.2 08/25/2022 0132   PO2ART 167 (H) 08/25/2022 0132   HCO3 22.7 08/25/2022 0132   TCO2 24 08/25/2022 0132   ACIDBASEDEF 2.0 08/25/2022 0132   O2SAT 99 08/25/2022 0132    - Abd: ND - Extremity: warm  .Intake/Output      10/24 0701 10/25 0700 10/25 0701 10/26 0700   P.Randy.     I.V. (mL/kg) 3198.2 (35.1)    Blood 480    IV Piggyback 2004.6    Total Intake(mL/kg) 5682.8 (62.3)    Urine (mL/kg/hr) 2375 (1.1)    Stool     Blood 710    Chest Tube 470    Total Output 3555    Net +2127.8            _______________________________________________________________ Labs:    Latest Ref Rng & Units 08/25/2022    3:46 AM 08/25/2022    1:32 AM 08/24/2022   11:29 PM  CBC  WBC 4.0 - 10.5 K/uL 7.9     Hemoglobin 13.0 - 17.0 g/dL 10.6  10.2  10.2   Hematocrit 39.0 - 52.0 % 30.9  30.0  30.0   Platelets 150 - 400 K/uL 81         Latest Ref Rng & Units 08/25/2022    3:46 AM 08/25/2022    1:32 AM 08/24/2022   11:29 PM  CMP  Glucose 70 - 99 mg/dL 151     BUN 8 - 23 mg/dL 16     Creatinine 0.61 - 1.24 mg/dL 0.89     Sodium 135 - 145 mmol/L 137   138  137   Potassium 3.5 - 5.1 mmol/L 4.3  4.4  4.7   Chloride 98 - 111 mmol/L 107     CO2 22 - 32 mmol/L 22     Calcium 8.9 - 10.3 mg/dL 8.3       CXR: stable  _______________________________________________________________  Assessment and Plan: POD 1 s/p CABG  Neuro: pain controlled CV: on A/S/BB.  Restarting plavix after wire removal.  Will start coreg Pulm: IS ambulation Renal: creat stable GI: on diet Heme: stable ID: [email or web address] Endo: SSI Dispo: continue ICU care   Randy Myers Randy Myers 08/25/2022 8:59 AM

## 2022-08-25 NOTE — Progress Notes (Signed)
     [address]       [place] [address]             [phone]       EVENING ROUNDS POD #1 SP CABG Stable  Low CT drainage today Good urine output Pt comfortable in bed

## 2022-08-25 NOTE — Anesthesia Postprocedure Evaluation (Signed)
Anesthesia Post Note  Patient: HAYS DUNNIGAN  Procedure(s) Performed: CORONARY ARTERY BYPASS GRAFTING (CABG) x3, USING LEFT INTERNAL MAMMARY ARTERY AND ENDOSCOPICALLY HARVESTED RIGHT GREATER SAPHENOUS VEIN (Chest) TRANSESOPHAGEAL ECHOCARDIOGRAM (TEE)     Patient location during evaluation: SICU Anesthesia Type: General Level of consciousness: patient remains intubated per anesthesia plan Pain management: pain level controlled Vital Signs Assessment: post-procedure vital signs reviewed and stable Respiratory status: patient remains intubated per anesthesia plan Cardiovascular status: stable Postop Assessment: no apparent nausea or vomiting Anesthetic complications: no   No notable events documented.  Last Vitals:  Vitals:   08/25/22 0645 08/25/22 0700  BP: (!) 107/53 102/78  Pulse: 72 74  Resp: 17 (!) 21  Temp: 37.3 C 37.3 C  SpO2: 99% 98%    Last Pain:  Vitals:   08/25/22 0643  TempSrc:   PainSc: 0-No pain                 Yanelly Cantrelle

## 2022-08-26 ENCOUNTER — Inpatient Hospital Stay (HOSPITAL_COMMUNITY): Payer: Medicare HMO

## 2022-08-26 LAB — BASIC METABOLIC PANEL
Anion gap: 6 (ref 5–15)
BUN: 15 mg/dL (ref 8–23)
CO2: 26 mmol/L (ref 22–32)
Calcium: 8.3 mg/dL — ABNORMAL LOW (ref 8.9–10.3)
Chloride: 104 mmol/L (ref 98–111)
Creatinine, Ser: 0.93 mg/dL (ref 0.61–1.24)
GFR, Estimated: >60 mL/min (ref >60)
Glucose, Bld: 144 mg/dL — ABNORMAL HIGH (ref 70–99)
Potassium: 4.1 mmol/L (ref 3.5–5.1)
Sodium: 136 mmol/L (ref 135–145)

## 2022-08-26 LAB — CBC
HCT: 28.8 % — ABNORMAL LOW (ref 39.0–52.0)
Hemoglobin: 9.6 g/dL — ABNORMAL LOW (ref 13.0–17.0)
MCH: 31.2 pg (ref 26.0–34.0)
MCHC: 33.3 g/dL (ref 30.0–36.0)
MCV: 93.5 fL (ref 80.0–100.0)
Platelets: 72 10*3/uL — ABNORMAL LOW (ref 150–400)
RBC: 3.08 MIL/uL — ABNORMAL LOW (ref 4.22–5.81)
RDW: 13.3 % (ref 11.5–15.5)
WBC: 6.4 10*3/uL (ref 4.0–10.5)
nRBC: 0 % (ref 0.0–0.2)

## 2022-08-26 LAB — GLUCOSE, CAPILLARY
Glucose-Capillary: 144 mg/dL — ABNORMAL HIGH (ref 70–99)
Glucose-Capillary: 138 mg/dL — ABNORMAL HIGH (ref 70–99)
Glucose-Capillary: 174 mg/dL — ABNORMAL HIGH (ref 70–99)
Glucose-Capillary: 191 mg/dL — ABNORMAL HIGH (ref 70–99)

## 2022-08-26 MED ORDER — POTASSIUM CHLORIDE CRYS ER 20 MEQ PO TBCR
40.0000 meq | EXTENDED_RELEASE_TABLET | Freq: Every day | ORAL | Status: DC
  Administered 2022-08-26: 40 meq via ORAL
  Filled 2022-08-26: qty 2

## 2022-08-26 MED ORDER — FUROSEMIDE 20 MG PO TABS
20.0000 mg | ORAL_TABLET | Freq: Every day | ORAL | Status: DC
  Administered 2022-08-26: 20 mg via ORAL
  Filled 2022-08-26: qty 1

## 2022-08-26 MED ORDER — AMIODARONE LOAD VIA INFUSION
150.0000 mg | Freq: Once | INTRAVENOUS | Status: AC
  Administered 2022-08-26: 150 mg via INTRAVENOUS

## 2022-08-26 MED ORDER — SODIUM CHLORIDE 0.9% FLUSH
3.0000 mL | Freq: Two times a day (BID) | INTRAVENOUS | Status: DC
  Administered 2022-08-26: 3 mL via INTRAVENOUS

## 2022-08-26 MED ORDER — AMIODARONE IV BOLUS ONLY 150 MG/100ML
150.0000 mg | Freq: Once | INTRAVENOUS | Status: AC
  Administered 2022-08-26: 150 mg via INTRAVENOUS

## 2022-08-26 MED ORDER — FUROSEMIDE 40 MG PO TABS
40.0000 mg | ORAL_TABLET | Freq: Every day | ORAL | Status: DC
  Administered 2022-08-26 – 2022-08-29 (×4): 40 mg via ORAL
  Filled 2022-08-26 (×4): qty 1

## 2022-08-26 MED ORDER — POTASSIUM CHLORIDE CRYS ER 10 MEQ PO TBCR
10.0000 meq | EXTENDED_RELEASE_TABLET | Freq: Every day | ORAL | Status: DC
  Administered 2022-08-26: 10 meq via ORAL
  Filled 2022-08-26: qty 1

## 2022-08-26 MED ORDER — SODIUM CHLORIDE 0.9 % IV SOLN: 250.0000 mL | INTRAVENOUS | Status: DC | PRN

## 2022-08-26 MED ORDER — ORAL CARE MOUTH RINSE: 15.0000 mL | OROMUCOSAL | Status: DC | PRN

## 2022-08-26 MED ORDER — AMIODARONE HCL IN DEXTROSE 360-4.14 MG/200ML-% IV SOLN
INTRAVENOUS | Status: AC
  Administered 2022-08-26: 150 mg via INTRAVENOUS
  Filled 2022-08-26: qty 200

## 2022-08-26 MED ORDER — AMIODARONE HCL IN DEXTROSE 360-4.14 MG/200ML-% IV SOLN
30.0000 mg/h | INTRAVENOUS | Status: AC
  Administered 2022-08-27: 30 mg/h via INTRAVENOUS
  Filled 2022-08-26: qty 200

## 2022-08-26 MED ORDER — ~~LOC~~ CARDIAC SURGERY, PATIENT & FAMILY EDUCATION: Freq: Once | Status: AC

## 2022-08-26 MED ORDER — SODIUM CHLORIDE 0.9% FLUSH: 3.0000 mL | INTRAVENOUS | Status: DC | PRN

## 2022-08-26 MED ORDER — AMIODARONE LOAD VIA INFUSION
150.0000 mg | Freq: Once | INTRAVENOUS | Status: AC
  Filled 2022-08-26: qty 83.34

## 2022-08-26 MED ORDER — AMIODARONE HCL IN DEXTROSE 360-4.14 MG/200ML-% IV SOLN
60.0000 mg/h | INTRAVENOUS | Status: AC
  Administered 2022-08-26 (×2): 60 mg/h via INTRAVENOUS
  Filled 2022-08-26 (×3): qty 200

## 2022-08-26 NOTE — Discharge Summary (Signed)
[address]       [place] [address]             [phone]    Physician Discharge Summary  Patient ID: Randy Myers MRN: [id number] DOB/AGE: 76-10-1946 76 y.o.  Admit date: 08/19/2022 Discharge date: 09/02/2022  Admission Diagnoses:  Patient Active Problem List   Diagnosis Date Noted   Paroxysmal A-fib [facility])    Persistent atrial fibrillation ([place]) 08/30/2022   S/P CABG x 3 08/24/2022   Multiple vessel coronary artery disease 08/20/2022   Ischemic cardiomyopathy 08/20/2022   Acute combined systolic and diastolic heart failure ([place]) 08/20/2022   Unstable angina ([place])    Fall from ladder 09/14/2019   Adenocarcinoma of prostate ([place]) 08/31/2017   Bunion 06/06/2017   Hammer toes of both feet 06/06/2017   Tinea pedis of both feet 06/06/2017   Hyperlipidemia with target LDL less than 70 09/03/2014   Stroke, 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 05/30/2014   Occlusion and stenosis of vertebral artery with cerebral infarction ([place]) 05/30/2014   TIA (transient ischemic attack) 06/11/2013   Dizziness 06/10/2013   Essential hypertension 06/10/2013     Discharge Diagnoses:  Patient Active Problem List   Diagnosis Date Noted   Paroxysmal A-fib ([place])    Persistent atrial fibrillation ([place]) 08/30/2022   S/P CABG x 3 08/24/2022   Multiple vessel coronary artery disease 08/20/2022   Ischemic cardiomyopathy 08/20/2022   Acute combined systolic and diastolic heart failure ([place]) 08/20/2022   Unstable angina ([place])    Fall from ladder 09/14/2019   Adenocarcinoma of prostate ([place]) 08/31/2017   Bunion 06/06/2017   Hammer toes of both feet 06/06/2017   Tinea pedis of both feet 06/06/2017   Hyperlipidemia with target LDL less than 70 09/03/2014   Stroke, 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  The patient presented to his Primary care physician Randy Myers on 08/11/2022 with complaints of episodic chest pressure with associated shortness of breath, nausea and vomiting after walking short distances.  Due to symptoms he was referred to Cardiology for evaluation.  He saw Dr. Marlou Porch on 10/12 at which time he admitted to chest pain and shortness of breath that had been progressing over the past 2 weeks.  These episodes occur intermittently but at times is so severe he experiences retching.  He has also been experiencing intermittent LE edema.  He denied palpitations, lightheadedness, syncope, or orthopnea.  EKG was obtained and showed NSR with non-specific ST changes with inferior infarct pattern and poor R wave progression.  It was recommended the patient undergo cardiac catheterization with possible PCI.  The patient was agreeable to proceed.  He presented to [facility] on 08/19/2022 for elective cardiac catheterization.  This showed a significantly reduced EF of 25-35% and multivessel CAD.  It was felt the patient would best be treated with revascularization and Cardiothoracic surgery consultation has been requested.  Echocardiogram has been ordered.  Currently the patient is chest pain free.  His last dose of Plavix was taken this morning.  He has never smoked.  Prior to admission he remained physically active.  He has no family history of CAD.  He would like to go home prior to surgery to get some things taken care of.  I explained to the patient that this will be up to Cardiology.  Hospital course:  Following diagnostic evaluation and medical  stabilization the patient was felt to be a good candidate to proceed with coronary artery bypass grafting.  On 08/24/2022 he was taken the operating room at which time he underwent the following procedure coronary artery bypass grafting x3.  The patient had a graft from the LIMA-LAD, saphenous vein graft-OM, and saphenous vein graft-PDA.  He tolerated the procedure well was taken to the surgical intensive care unit in stable condition.  Postoperative hospital course:  Patient was extubated using standard post cardiac surgical protocols without difficulty.  He has maintained stable hemodynamics and did not require inotropic or vasopressor support.  All routine lines, monitors and drainage devices have been discontinued in the standard fashion.  Following pacer wire removal Plavix was initiated.  He has initially been started on aspirin, statin and beta-blocker. He went into a fib with RVR. He was put on Amiodarone. He was transitioned off the Insulin drip. His pre op HGA1C is 5.4. Oxygen has been weaned and he maintains good saturations on room air.  Blood sugars have been under good control using standard protocols.  He is maintaining normal renal function with good urine output.  He was given a short course of diuretics.  He does have an expected acute blood loss anemia which has been equilibrating and is not in the transfusion threshold.  He has a postoperative thrombocytopenia that is being monitored clinically.  It has improved over time and is now in the normal range.  Accu checks and sliding scale PRN were stopped after transfer. He was put back on the Amiodarone drip 10/27 because of more a fib.  Due to the difficulty with managing his atrial fibrillation and EP consultation was obtained.  Medications were adjusted and he was also started on apixaban.  He has been chemically cardioverted to sinus rhythm which she currently maintains.  Oxygen has been weaned off and he maintains good saturations on room  air.  Incisions are all healing well without evidence of infection.  He is tolerating diet.  He is tolerating routine cardiac rehab phase 1 modalities without difficulty.  At the time of discharge the patient is felt to be quite stable.    Consults: cardiology  Significant Diagnostic Studies:  DG CHEST PORT 1 VIEW  Result Date: 08/28/2022 CLINICAL DATA:  Chest tube removal EXAM: PORTABLE CHEST 1 VIEW COMPARISON:  August 26, 2022 FINDINGS: Left-sided rib fractures remain. No pneumothorax. Minimal atelectasis in the left base. No overt edema. No other infiltrate, nodule, or mass. The cardiomediastinal silhouette is stable. Sternotomy wires are intact. IMPRESSION: Left-sided rib fractures remain. No pneumothorax. No other acute abnormalities. Electronically Signed   By: Dorise Bullion III M.D.   On: 08/28/2022 08:35   DG Chest Port 1 View  Result Date: 08/26/2022 CLINICAL DATA:  Chest tube in place. EXAM: PORTABLE CHEST 1 VIEW COMPARISON:  08/25/2022 FINDINGS: Sternotomy wires overlie normal cardiac silhouette. Central venous line with tip in distal SVC. No effusion, infiltrate pneumothorax. Low lung volumes. IMPRESSION: Low lung volumes.  No edema or infiltrate. Electronically Signed   By: Suzy Bouchard M.D.   On:  08/26/2022 09:12   ECHO INTRAOPERATIVE TEE  Result Date: 08/25/2022  *INTRAOPERATIVE TRANSESOPHAGEAL REPORT *  Patient Name:   KLAUS CASTENEDA Koppelman Date of Exam: 08/24/2022 Medical Rec #:  [id number]       Height:       71.0 in Accession #:    [id number]      Weight:       190.8 lb Date of Birth:  [birth year]        BSA:          2.07 m Patient Age:    6 years        BP:           112/74 mmHg Patient Gender: M               HR:           55 bpm. Exam Location:  Anesthesiology Transesophogeal exam was perform intraoperatively during surgical procedure. Patient was closely monitored under general anesthesia during the entirety of examination. Indications:     CAD Native Vessel i25.10  Performing Phys: [id number] Lucile Crater LIGHTFOOT Diagnosing Phys: Belinda Block MD POST-OP IMPRESSIONS _ Left Ventricle: Post Bypass: The patient came off bypass on the initial attempt on noted OR pressors. There did not appear to be any new findings form preop exam (Dr. Marcie Bal performed Post Bypass inital exam). Left ventricular contraction did improve with volume replacement. The TEE that had been placed after induction uneventfully was removed at the end of the case without difficulty. The patient was later taken to the SICU in stable conditon. PRE-OP FINDINGS  Left Ventricle: The left ventricle has moderate-severely reduced systolic function, with an ejection fraction of 30-35%. Left Atrium: No left atrial/left atrial appendage thrombus was detected. Mitral Valve: The mitral valve is normal in structure. Mitral valve regurgitation is trivial by color flow Doppler. There is moderate calcification present. Tricuspid Valve: The tricuspid valve was normal in structure. Aortic Valve: The aortic valve is tricuspid Aortic valve regurgitation was not visualized by color flow Doppler. There is moderate calcification present.  Belinda Block MD Electronically signed by Belinda Block MD Signature Date/Time: 08/25/2022/10:38:33 AM   Final    DG Chest Port 1 View  Result Date: 08/25/2022 CLINICAL DATA:  Provided history: Chest tube present status post CABG x3, sore chest. EXAM: PORTABLE CHEST 1 VIEW COMPARISON:  Prior chest radiographs 08/24/2022 and earlier. FINDINGS: Right IJ approach introducer sheath with Swan-Ganz catheter terminating at the level of the lower SVC. Redemonstrated chest tubes/mediastinal drains. Previously demonstrated ET and enteric tubes no longer visualized. Prior median sternotomy/CABG. Cardiomegaly, unchanged. Mild atelectasis within the medial left lung base. No appreciable airspace consolidation on the right. No evidence of pleural effusion or pneumothorax. Chronic left-sided rib fracture  deformities. IMPRESSION: Support apparatus, as described. Cardiomegaly. Mild atelectasis within the left lung base, new from the prior examination of 08/24/2022. Electronically Signed   By: Kellie Simmering D.O.   On: 08/25/2022 08:34   DG Chest Port 1 View  Result Date: 08/24/2022 CLINICAL DATA:  Status post coronary artery bypass surgery EXAM: PORTABLE CHEST 1 VIEW COMPARISON:  Previous studies including the examination of 08/23/2022 FINDINGS: Transverse diameter of heart is slightly increased. There is evidence of interval coronary bypass surgery. Tip of endotracheal tube is 4.3 cm above the carina. Tip of enteric tube is seen in the stomach. Tip of right IJ central venous catheter is seen in superior vena cava. There is possible mediastinal drain. Deformities are seen in multiple left  ribs, possibly residual from previous injury. There is no pleural effusion or pneumothorax. Small linear densities are seen in left lower lung fields suggesting subsegmental atelectasis. There is no focal consolidation. There is no significant pleural effusion or pneumothorax. IMPRESSION: Subsegmental atelectasis is seen in left lower lung field. There are no signs of pulmonary edema or focal pulmonary consolidation. Status post coronary artery bypass surgery. Support devices as described in the body of the report. Electronically Signed   By: Elmer Picker M.D.   On: 08/24/2022 18:42   DG Chest 2 View  Result Date: 08/23/2022 CLINICAL DATA:  Pre-op chest examination. EXAM: CHEST - 2 VIEW COMPARISON:  Chest radiograph 12/13/2018 FINDINGS: Old left rib fractures. Heart size is stable and within normal limits. The ascending thoracic aorta appears to be prominent and cannot exclude dilatation. No focal lung disease or pulmonary edema. No large pleural effusions. Degenerative changes in the thoracic spine. IMPRESSION: 1. No acute cardiopulmonary disease. 2. Prominence of the ascending thoracic aorta. Cannot exclude  dilatation. Consider further characterization with chest CT. Electronically Signed   By: Markus Daft M.D.   On: 08/23/2022 15:55   VAS US DOPPLER PRE CABG  Result Date: 08/20/2022 PREOPERATIVE VASCULAR EVALUATION Patient Name:  ALYX GEE Stallman  Date of Exam:   08/20/2022 Medical Rec #: [id number]        Accession #:    [id number] Date of Birth: 22-Mar-1946         Patient Gender: M Patient Age:   31 years Exam Location:  [facility] Procedure:      VAS US DOPPLER PRE CABG Referring Phys: HARRELL LIGHTFOOT --------------------------------------------------------------------------------  Indications:      Pre-CABG. Risk Factors:     Hypertension, no history of smoking, coronary artery disease. Comparison Study: No prior study Performing Technologist: Maudry Mayhew MHA, RVT, RDCS, RDMS  Examination Guidelines: A complete evaluation includes B-mode imaging, spectral Doppler, color Doppler, and power Doppler as needed of all accessible portions of each vessel. Bilateral testing is considered an integral part of a complete examination. Limited examinations for reoccurring indications may be performed as noted.  Right Carotid Findings: +----------+--------+-------+--------+----------------------+------------------+           PSV cm/sEDV    StenosisDescribe              Comments                             cm/s                                                    +----------+--------+-------+--------+----------------------+------------------+ CCA Prox  74      12                                                      +----------+--------+-------+--------+----------------------+------------------+ CCA Distal55      21                                   intimal thickening +----------+--------+-------+--------+----------------------+------------------+ ICA Prox  31      11  smooth and                                                                heterogenous                              +----------+--------+-------+--------+----------------------+------------------+ ICA Distal58      23                                                      +----------+--------+-------+--------+----------------------+------------------+ ECA       77      12             smooth and                                                                heterogenous                             +----------+--------+-------+--------+----------------------+------------------+ +----------+--------+-------+----------------+------------+           PSV cm/sEDV cmsDescribe        Arm Pressure +----------+--------+-------+----------------+------------+ Subclavian116            Multiphasic, WNL             +----------+--------+-------+----------------+------------+ +---------+--------+--+--------+--+---------+ VertebralPSV cm/s44EDV cm/s18Antegrade +---------+--------+--+--------+--+---------+ Left Carotid Findings: +----------+--------+--------+--------+-----------------------+--------+           PSV cm/sEDV cm/sStenosisDescribe               Comments +----------+--------+--------+--------+-----------------------+--------+ CCA Prox  66      17                                              +----------+--------+--------+--------+-----------------------+--------+ CCA Distal59      21                                              +----------+--------+--------+--------+-----------------------+--------+ ICA Prox  53      20              smooth and heterogenous         +----------+--------+--------+--------+-----------------------+--------+ ICA Distal44      17                                              +----------+--------+--------+--------+-----------------------+--------+ ECA       73      8                                               +----------+--------+--------+--------+-----------------------+--------+   +----------+--------+--------+----------------+------------+  SubclavianPSV cm/sEDV cm/sDescribe        Arm Pressure +----------+--------+--------+----------------+------------+           121             Multiphasic, WNL             +----------+--------+--------+----------------+------------+ +---------+--------+--+--------+----------------------+ VertebralPSV cm/s10EDV cm/sAntegrade and Atypical +---------+--------+--+--------+----------------------+  ABI Findings: +--------+------------------+-----+---------+--------+ Right   Rt Pressure (mmHg)IndexWaveform Comment  +--------+------------------+-----+---------+--------+ [id number]                    triphasic         +--------+------------------+-----+---------+--------+ PTA     139               1.19 triphasic         +--------+------------------+-----+---------+--------+ DP      144               1.23 biphasic          +--------+------------------+-----+---------+--------+ +--------+------------------+-----+---------+-------+ Left    Lt Pressure (mmHg)IndexWaveform Comment +--------+------------------+-----+---------+-------+ [id number]                    triphasic        +--------+------------------+-----+---------+-------+ PTA     135               1.15 triphasic        +--------+------------------+-----+---------+-------+ DP      139               1.19 triphasic        +--------+------------------+-----+---------+-------+ +-------+---------------+----------------+ ABI/TBIToday's ABI/TBIPrevious ABI/TBI +-------+---------------+----------------+ Right  1.23                            +-------+---------------+----------------+ Left   1.19                            +-------+---------------+----------------+  Right Doppler Findings: +-----------+--------+-----+---------+-----------------------------------------+ Site       PressureIndexDoppler  Comments                                   +-----------+--------+-----+---------+-----------------------------------------+ Brachial   113          triphasic                                          +-----------+--------+-----+---------+-----------------------------------------+ Radial                  triphasic                                          +-----------+--------+-----+---------+-----------------------------------------+ Ulnar                   triphasic                                          +-----------+--------+-----+---------+-----------------------------------------+ Palmar Arch                      Signal decreases >50% with radial  compression, signal is unaffected with                                     ulnar compression.                        +-----------+--------+-----+---------+-----------------------------------------+  Left Doppler Findings: +-----------+--------+-----+---------+-----------------------------------------+ Site       PressureIndexDoppler  Comments                                  +-----------+--------+-----+---------+-----------------------------------------+ Brachial   117          triphasic                                          +-----------+--------+-----+---------+-----------------------------------------+ Radial                  triphasic                                          +-----------+--------+-----+---------+-----------------------------------------+ Ulnar                   triphasic                                          +-----------+--------+-----+---------+-----------------------------------------+ Palmar Arch                      Signal obliterates with radial                                             compression, is unaffected with ulnar                                      compression.                               +-----------+--------+-----+---------+-----------------------------------------+   Summary: Right Carotid: The extracranial vessels were near-normal with only minimal wall                thickening or plaque. Left Carotid: The extracranial vessels were near-normal with only minimal wall               thickening or plaque. Vertebrals:  Bilateral vertebral arteries demonstrate antegrade flow. Left              vertebral artery demonstrates high resistant flow. Subclavians: Normal flow hemodynamics were seen in bilateral subclavian              arteries. Right ABI: Resting right ankle-brachial index is within normal range. Left ABI: Resting left ankle-brachial index is within normal range. Right Upper Extremity: Doppler waveforms decrease >50% with right radial compression. Doppler waveforms remain within normal limits with right ulnar compression. Left Upper Extremity: Doppler waveform obliterate with left  radial compression. Doppler waveforms remain within normal limits with left ulnar compression.  Electronically signed by Servando Snare MD on 08/20/2022 at 5:27:04 PM.    Final    ECHOCARDIOGRAM COMPLETE  Result Date: 08/19/2022    ECHOCARDIOGRAM REPORT   Patient Name:   ELLIOTT LASECKI Malanga Date of Exam: 08/19/2022 Medical Rec #:  [id number]       Height:       71.0 in Accession #:    [id number]      Weight:       195.0 lb Date of Birth:  12/24/1945        BSA:          2.086 m Patient Age:    71 years        BP:           128/65 mmHg Patient Gender: M               HR:           62 bpm. Exam Location:  Inpatient Procedure: 2D Echo, Cardiac Doppler, Color Doppler and Intracardiac            Opacification Agent Indications:    CAD native vessel  History:        Patient has prior history of Echocardiogram examinations, most                 recent 06/11/2013. Angina; Risk Factors:Hypertension and                 Dyslipidemia. Hx CVA.  Sonographer:    Clayton Lefort RDCS (AE) Referring Phys: [place] Comments: Suboptimal subcostal window. IMPRESSIONS  1. Akinesis of the distal anteroseptal and apical walls with overall moderate to severe LV dysfunction; probable early apical thrombus noted using definity; calcified aortic valve with partial fusion of noncoronary and left cusps; no significant AS by doppler.  2. Left ventricular ejection fraction, by estimation, is 30 to 35%. The left ventricle has moderate to severely decreased function. The left ventricle demonstrates regional wall motion abnormalities (see scoring diagram/findings for description). There is mild concentric left ventricular hypertrophy. Left ventricular diastolic parameters are consistent with Grade I diastolic dysfunction (impaired relaxation).  3. Right ventricular systolic function is normal. The right ventricular size is normal.  4. The mitral valve is normal in structure. No evidence of mitral valve regurgitation. No evidence of mitral stenosis. Moderate mitral annular calcification.  5. The aortic valve is calcified. Aortic valve regurgitation is not visualized. No aortic stenosis is present.  6. Aortic dilatation noted. There is borderline dilatation of the aortic root, measuring 38 mm. FINDINGS  Left Ventricle: Left ventricular ejection fraction, by estimation, is 30 to 35%. The left ventricle has moderate to severely decreased function. The left ventricle demonstrates regional wall motion abnormalities. Definity contrast agent was given IV to delineate the left ventricular endocardial borders. The left ventricular internal cavity size was normal in size. There is mild concentric left ventricular hypertrophy. Left ventricular diastolic parameters are consistent with Grade I diastolic dysfunction (impaired relaxation). Right Ventricle: The right ventricular size is normal. Right ventricular systolic function is normal. Left Atrium: Left atrial size was normal in size. Right Atrium: Right atrial size was normal in size.  Pericardium: There is no evidence of pericardial effusion. Mitral Valve: The mitral valve is normal in structure. Moderate mitral annular calcification. No evidence of mitral valve regurgitation. No evidence of mitral valve stenosis. Tricuspid Valve: The tricuspid  valve is normal in structure. Tricuspid valve regurgitation is trivial. No evidence of tricuspid stenosis. Aortic Valve: The aortic valve is calcified. Aortic valve regurgitation is not visualized. No aortic stenosis is present. Aortic valve mean gradient measures 4.0 mmHg. Aortic valve peak gradient measures 7.4 mmHg. Aortic valve area, by VTI measures 2.44 cm. Pulmonic Valve: The pulmonic valve was normal in structure. Pulmonic valve regurgitation is not visualized. No evidence of pulmonic stenosis. Aorta: Aortic dilatation noted. There is borderline dilatation of the aortic root, measuring 38 mm. Venous: The inferior vena cava was not well visualized. IAS/Shunts: No atrial level shunt detected by color flow Doppler. Additional Comments: Akinesis of the distal anteroseptal and apical walls with overall moderate to severe LV dysfunction; probable early apical thrombus noted using definity; calcified aortic valve with partial fusion of noncoronary and left cusps; no significant AS by doppler.  LEFT VENTRICLE PLAX 2D LVIDd:         4.90 cm      Diastology LVIDs:         3.80 cm      LV e' medial:    4.13 cm/s LV PW:         1.00 cm      LV E/e' medial:  12.3 LV IVS:        1.20 cm      LV e' lateral:   4.03 cm/s LVOT diam:     2.20 cm      LV E/e' lateral: 12.6 LV SV:         62 LV SV Index:   30 LVOT Area:     3.80 cm  LV Volumes (MOD) LV vol d, MOD A4C: 151.0 ml LV vol s, MOD A4C: 80.2 ml LV SV MOD A4C:     151.0 ml RIGHT VENTRICLE RV Basal diam:  2.80 cm RV S prime:     7.29 cm/s TAPSE (M-mode): 2.0 cm LEFT ATRIUM             Index        RIGHT ATRIUM           Index LA diam:        4.50 cm 2.16 cm/m   RA Area:     13.60 cm LA Vol (A2C):   56.2 ml  26.94 ml/m  RA Volume:   26.80 ml  12.85 ml/m LA Vol (A4C):   57.9 ml 27.76 ml/m LA Biplane Vol: 57.7 ml 27.66 ml/m  AORTIC VALVE AV Area (Vmax):    2.11 cm AV Area (Vmean):   2.03 cm AV Area (VTI):     2.44 cm AV Vmax:           136.00 cm/s AV Vmean:          92.400 cm/s AV VTI:            0.255 m AV Peak Grad:      7.4 mmHg AV Mean Grad:      4.0 mmHg LVOT Vmax:         75.40 cm/s LVOT Vmean:        49.300 cm/s LVOT VTI:          0.164 m LVOT/AV VTI ratio: 0.64  AORTA Ao Root diam: 3.80 cm MITRAL VALVE MV Area (PHT): 2.57 cm    SHUNTS MV Decel Time: 295 msec    Systemic VTI:  0.16 m MV E velocity: 50.80 cm/s  Systemic Diam: 2.20 cm MV A velocity: 92.80 cm/s  MV E/A ratio:  0.55 Kirk Ruths MD Electronically signed by Kirk Ruths MD Signature Date/Time: 08/19/2022/4:39:12 PM    Final    CARDIAC CATHETERIZATION  Result Date: 08/19/2022   Ost RCA to Prox RCA lesion is 40% stenosed.   Prox RCA to Mid RCA lesion is 100% stenosed.   Mid LM to Dist LM lesion is 40% stenosed.   Ost LAD to Prox LAD lesion is 40% stenosed.   Prox LAD to Mid LAD lesion is 99% stenosed.   2nd Mrg lesion is 70% stenosed.   Prox Cx to Mid Cx lesion is 70% stenosed.   There is moderate to severe left ventricular systolic dysfunction.   LV end diastolic pressure is normal.   The left ventricular ejection fraction is 25-35% by visual estimate. 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 The mid and distal vessel fills briskly from left to right collaterals. Severe segmental LV dysfunction with hypokinesis of the anterior wall. LVEDP 15 mmHg Recommendations: I think the best strategy for revascularization will be bypass surgery. The LAD stenosis is a long segment and there is almost complete disconnection through the lesion. This also involves a large septal that  supplies collaterals to the occluded RCA. PCI of the LAD could potentially disrupt collateral flow. LV gram suggests at least moderate LV systolic dysfunction with severe hypokinesis of the anterior wall. Will admit to telemetry as he is having unstable symptoms at home. Will consult CT surgery for CABG. Will hold Plavix today and allow washout. Will start ASA and will start IV heparin 6 hours post sheath pull given the severity of the LAD disease. High intensity statin.      Results for orders placed or performed during the hospital encounter of 08/19/22 (from the past 72 hour(s))  Glucose, capillary     Status: Abnormal   Collection Time: 08/30/22  4:47 PM  Result Value Ref Range   Glucose-Capillary 100 (H) 70 - 99 mg/dL    Comment: Glucose reference range applies only to samples taken after fasting for at least 8 hours.  Glucose, capillary     Status: Abnormal   Collection Time: 08/30/22  9:13 PM  Result Value Ref Range   Glucose-Capillary 136 (H) 70 - 99 mg/dL    Comment: Glucose reference range applies only to samples taken after fasting for at least 8 hours.  Magnesium     Status: None   Collection Time: 08/31/22  5:37 AM  Result Value Ref Range   Magnesium 2.1 1.7 - 2.4 mg/dL    Comment: Performed at [facility] [address], [place], [place] [address]  CBC     Status: Abnormal   Collection Time: 08/31/22  5:37 AM  Result Value Ref Range   WBC 6.8 4.0 - 10.5 K/uL   RBC 3.66 (L) 4.22 - 5.81 MIL/uL   Hemoglobin 11.3 (L) 13.0 - 17.0 g/dL   HCT 33.4 (L) 39.0 - 52.0 %   MCV 91.3 80.0 - 100.0 fL   MCH 30.9 26.0 - 34.0 pg   MCHC 33.8 30.0 - 36.0 g/dL   RDW 13.8 11.5 - 15.5 %   Platelets 235 150 - 400 K/uL   nRBC 0.0 0.0 - 0.2 %    Comment: Performed at [facility], [place] [address], [place], [place] [address]  Basic metabolic panel     Status: Abnormal   Collection Time: 08/31/22  5:37 AM  Result Value Ref Range   Sodium 141 135 - 145 mmol/L   Potassium 4.2 3.5 -  5.1 mmol/L   [place] 103 98 - 111 mmol/L   CO2 24 22 - 32 mmol/L   Glucose, Bld 134 (H) 70 - 99 mg/dL    Comment: Glucose reference range applies only to samples taken after fasting for at least 8 hours.   BUN 16 8 - 23 mg/dL   Creatinine, Ser 1.08 0.61 - 1.24 mg/dL   Calcium 9.0 8.9 - 10.3 mg/dL   GFR, Estimated >60 >60 mL/min    Comment: (NOTE) Calculated using the CKD-EPI Creatinine Equation (2021)    Anion gap 14 5 - 15    Comment: Performed at [facility] [address], [place], [place] [address]  Glucose, capillary     Status: Abnormal   Collection Time: 08/31/22  9:20 AM  Result Value Ref Range   Glucose-Capillary 152 (H) 70 - 99 mg/dL    Comment: Glucose reference range applies only to samples taken after fasting for at least 8 hours.  Glucose, capillary     Status: Abnormal   Collection Time: 08/31/22 11:20 AM  Result Value Ref Range   Glucose-Capillary 101 (H) 70 - 99 mg/dL    Comment: Glucose reference range applies only to samples taken after fasting for at least 8 hours.   Comment 1 Document in Chart   Glucose, capillary     Status: Abnormal   Collection Time: 08/31/22  4:45 PM  Result Value Ref Range   Glucose-Capillary 106 (H) 70 - 99 mg/dL    Comment: Glucose reference range applies only to samples taken after fasting for at least 8 hours.  Glucose, capillary     Status: Abnormal   Collection Time: 08/31/22  9:01 PM  Result Value Ref Range   Glucose-Capillary 140 (H) 70 - 99 mg/dL    Comment: Glucose reference range applies only to samples taken after fasting for at least 8 hours.  CBC     Status: Abnormal   Collection Time: 09/01/22  3:53 AM  Result Value Ref Range   WBC 7.2 4.0 - 10.5 K/uL   RBC 3.70 (L) 4.22 - 5.81 MIL/uL   Hemoglobin 11.2 (L) 13.0 - 17.0 g/dL   HCT 34.2 (L) 39.0 - 52.0 %   MCV 92.4 80.0 - 100.0 fL   MCH 30.3 26.0 - 34.0 pg   MCHC 32.7 30.0 - 36.0 g/dL   RDW 14.2 11.5 - 15.5 %   Platelets 249 150 - 400 K/uL   nRBC 0.0 0.0 -  0.2 %    Comment: Performed at [facility], [place] [address], [place], [place] [address]  Basic metabolic panel     Status: Abnormal   Collection Time: 09/01/22  3:53 AM  Result Value Ref Range   Sodium 139 135 - 145 mmol/L   Potassium 4.8 3.5 - 5.1 mmol/L   [place] 105 98 - 111 mmol/L   CO2 25 22 - 32 mmol/L   Glucose, Bld 109 (H) 70 - 99 mg/dL    Comment: Glucose reference range applies only to samples taken after fasting for at least 8 hours.   BUN 22 8 - 23 mg/dL   Creatinine, Ser 1.28 (H) 0.61 - 1.24 mg/dL   Calcium 8.8 (L) 8.9 - 10.3 mg/dL   GFR, Estimated 58 (L) >  60 mL/min    Comment: (NOTE) Calculated using the CKD-EPI Creatinine Equation (2021)    Anion gap 9 5 - 15    Comment: Performed at [facility], [place] [address] [place], [place] [address]  Glucose, capillary     Status: Abnormal   Collection Time: 09/01/22  6:18 AM  Result Value Ref Range   Glucose-Capillary 131 (H) 70 - 99 mg/dL    Comment: Glucose reference range applies only to samples taken after fasting for at least 8 hours.  Glucose, capillary     Status: Abnormal   Collection Time: 09/01/22 12:55 PM  Result Value Ref Range   Glucose-Capillary 115 (H) 70 - 99 mg/dL    Comment: Glucose reference range applies only to samples taken after fasting for at least 8 hours.  Glucose, capillary     Status: Abnormal   Collection Time: 09/01/22  3:59 PM  Result Value Ref Range   Glucose-Capillary 114 (H) 70 - 99 mg/dL    Comment: Glucose reference range applies only to samples taken after fasting for at least 8 hours.  Glucose, capillary     Status: Abnormal   Collection Time: 09/01/22  9:34 PM  Result Value Ref Range   Glucose-Capillary 116 (H) 70 - 99 mg/dL    Comment: Glucose reference range applies only to samples taken after fasting for at least 8 hours.  CBC     Status: Abnormal   Collection Time: 09/02/22  1:24 AM  Result Value Ref Range   WBC 7.6 4.0 - 10.5 K/uL   RBC 3.58 (L) 4.22 - 5.81  MIL/uL   Hemoglobin 10.9 (L) 13.0 - 17.0 g/dL   HCT 32.6 (L) 39.0 - 52.0 %   MCV 91.1 80.0 - 100.0 fL   MCH 30.4 26.0 - 34.0 pg   MCHC 33.4 30.0 - 36.0 g/dL   RDW 13.7 11.5 - 15.5 %   Platelets 259 150 - 400 K/uL   nRBC 0.0 0.0 - 0.2 %    Comment: Performed at [facility], [place] [address], [place], [place] [address]  Basic metabolic panel     Status: Abnormal   Collection Time: 09/02/22  1:24 AM  Result Value Ref Range   Sodium 139 135 - 145 mmol/L   Potassium 4.2 3.5 - 5.1 mmol/L   [place] 106 98 - 111 mmol/L   CO2 24 22 - 32 mmol/L   Glucose, Bld 117 (H) 70 - 99 mg/dL    Comment: Glucose reference range applies only to samples taken after fasting for at least 8 hours.   BUN 24 (H) 8 - 23 mg/dL   Creatinine, Ser 1.27 (H) 0.61 - 1.24 mg/dL   Calcium 8.7 (L) 8.9 - 10.3 mg/dL   GFR, Estimated 59 (L) >60 mL/min    Comment: (NOTE) Calculated using the CKD-EPI Creatinine Equation (2021)    Anion gap 9 5 - 15    Comment: Performed at [facility] [address], [place], [place] [address]  Glucose, capillary     Status: Abnormal   Collection Time: 09/02/22  6:28 AM  Result Value Ref Range   Glucose-Capillary 108 (H) 70 - 99 mg/dL    Comment: Glucose reference range applies only to samples taken after fasting for at least 8 hours.    Treatments: surgery:  08/24/2022   3:40 PM   PATIENT:  [name]  76 y.o. male   PRE-OPERATIVE DIAGNOSIS:  Coronary Artery Disease  POST-OPERATIVE DIAGNOSIS:  Coronary Artery Disease   PROCEDURE:  Procedure(s): CORONARY ARTERY BYPASS GRAFTING (CABG) x 3, USING LEFT INTERNAL MAMMARY ARTERY AND ENDOSCOPICALLY HARVESTED RIGHT GREATER SAPHENOUS VEIN    LIMA-LAD SVG-OM SVG-RCA   TRANSESOPHAGEAL ECHOCARDIOGRAM  Vein harvest time: 55mn        Vein prep time: 128m   SURGEON: Lightfoot, HaLucile CraterMD - Primary   PHYSICIAN ASSISTANT: Shannah Conteh PA-C, MYRON RODDENBERRY PA-C   ASSISTANTS: RNFA STAFF    ANESTHESIA:   GENERAL   EBL: 15019m  Discharge Exam: Blood pressure 92/60, pulse 65, temperature 97.8 F (36.6 C), temperature source Oral, resp. rate 15, height _0  (1.803 m), weight 84.4 kg, SpO2 100 %.    General appearance: alert, cooperative, and no distress Heart: regular rate and rhythm Lungs: clear to auscultation bilaterally Abdomen: benign Extremities: trace ankle edema Wound: healing well Discharge Medications:  The patient has been discharged on:  y 1.Beta Blocker:  Yes [   ]                              No   [   ]                              If No, reason:  2.Ace Inhibitor/ARB: Yes [   ]                                     No  [   n ]                                     If No, reason: Labile blood pressure  3.Statin:   Yes [  y ]                  No  [   ]                  If No, reason:  4.Ecasa:  Yes  [  y ]                  No   [   ]                  If No, reason:  Patient had ACS upon admission:  Plavix/P2Y12 inhibitor: Yes [ y  ]                                      No  [   ]     Discharge Instructions     Amb Referral to Cardiac Rehabilitation   Complete by: As directed    Diagnosis: CABG   CABG X ___: 3   After initial evaluation and assessments completed: Virtual Based Care may be provided alone or in conjunction with Phase 2 Cardiac Rehab based on patient barriers.: Yes   Intensive Cardiac Rehabilitation (ICR) [facility] only OR Traditional Cardiac Rehabilitation (TCR) *If criteria for ICR are not met [facility] Yes   Discharge patient   Complete by: As directed    Discharge disposition: 01-Home or Self  Care   Discharge patient date: 09/02/2022      Allergies as of 09/02/2022   No Known Allergies      Medication List     STOP taking these medications    clopidogrel 75 MG tablet Commonly known as: PLAVIX   famotidine 40 MG tablet Commonly known as: PEPCID   lisinopril 5 MG tablet Commonly known as:  ZESTRIL       TAKE these medications    acetaminophen 650 MG CR tablet Commonly known as: TYLENOL Take 650 mg by mouth every 8 (eight) hours as needed for pain.   amiodarone 200 MG tablet Commonly known as: PACERONE Take 2 tablets (400 mg total) by mouth 2 (two) times daily. For 14 days, then change to 200 mg po daily   aspirin EC 81 MG tablet Take 1 tablet (81 mg total) by mouth daily. Swallow whole.   atorvastatin 80 MG tablet Commonly known as: LIPITOR Take 1 tablet (80 mg total) by mouth daily. What changed:  medication strength how much to take when to take this   Eliquis 5 MG Tabs tablet Generic drug: apixaban Take 1 tablet (5 mg total) by mouth 2 (two) times daily.   metoprolol succinate 50 MG 24 hr tablet Commonly known as: TOPROL-XL Take 1 tablet (50 mg total) by mouth daily. Take with or immediately following a meal.   multivitamin with minerals Tabs tablet Take 1 tablet by mouth daily with breakfast.   omeprazole 40 MG capsule Commonly known as: PRILOSEC Take 40 mg by mouth daily with breakfast.   spironolactone 25 MG tablet Commonly known as: ALDACTONE Take 1/2 tablet (12.5 mg total) by mouth daily.   traMADol 50 MG tablet Commonly known as: ULTRAM Take 1 tablet (50 mg total) by mouth every 6 (six) hours as needed for moderate pain.        Follow-up Information     Lightfoot, Lucile Crater, MD Follow up.   Specialty: Cardiothoracic Surgery Why: Please see discharge paperwork for follow-up appointments with surgeon.  Your first visit will be a virtual visit, do not come to the office.  Subsequent visits will be in person. Contact information: [address] [place] [address] [place]. Go in 12 day(s).   Specialty: Cardiology Why: Hospital follow up PLEASE bring a current medication list to appointment FREE valet parking, Entrance C, off [employer] information: [address] [id number] [place] [phone]                Signed:  John Giovanni, PA-C  09/02/2022, 3:22 PM

## 2022-08-26 NOTE — Plan of Care (Signed)
  Problem: Education: Goal: Understanding of CV disease, CV risk reduction, and recovery process will improve Outcome: Progressing Goal: Individualized Educational Video(s) Outcome: Progressing   Problem: Activity: Goal: Ability to return to baseline activity level will improve Outcome: Progressing   Problem: Cardiovascular: Goal: Ability to achieve and maintain adequate cardiovascular perfusion will improve Outcome: Progressing Goal: Vascular access site(s) Level 0-1 will be maintained Outcome: Progressing   Problem: Health Behavior/Discharge Planning: Goal: Ability to safely manage health-related needs after discharge will improve Outcome: Progressing   Problem: Education: Goal: Knowledge of General Education information will improve Description: Including pain rating scale, medication(s)/side effects and non-pharmacologic comfort measures Outcome: Progressing   Problem: Health Behavior/Discharge Planning: Goal: Ability to manage health-related needs will improve Outcome: Progressing   Problem: Clinical Measurements: Goal: Ability to maintain clinical measurements within normal limits will improve Outcome: Progressing Goal: Will remain free from infection Outcome: Progressing Goal: Diagnostic test results will improve Outcome: Progressing Goal: Respiratory complications will improve Outcome: Progressing Goal: Cardiovascular complication will be avoided Outcome: Progressing   Problem: Activity: Goal: Risk for activity intolerance will decrease Outcome: Progressing   Problem: Nutrition: Goal: Adequate nutrition will be maintained Outcome: Progressing   Problem: Coping: Goal: Level of anxiety will decrease Outcome: Progressing   Problem: Elimination: Goal: Will not experience complications related to bowel motility Outcome: Progressing Goal: Will not experience complications related to urinary retention Outcome: Progressing   Problem: Pain Managment: Goal:  General experience of comfort will improve Outcome: Progressing   Problem: Safety: Goal: Ability to remain free from injury will improve Outcome: Progressing   Problem: Skin Integrity: Goal: Risk for impaired skin integrity will decrease Outcome: Progressing   Problem: Education: Goal: Will demonstrate proper wound care and an understanding of methods to prevent future damage Outcome: Progressing Goal: Knowledge of disease or condition will improve Outcome: Progressing Goal: Knowledge of the prescribed therapeutic regimen will improve Outcome: Progressing Goal: Individualized Educational Video(s) Outcome: Progressing   Problem: Activity: Goal: Risk for activity intolerance will decrease Outcome: Progressing   Problem: Cardiac: Goal: Will achieve and/or maintain hemodynamic stability Outcome: Progressing   Problem: Clinical Measurements: Goal: Postoperative complications will be avoided or minimized Outcome: Progressing   Problem: Respiratory: Goal: Respiratory status will improve Outcome: Progressing   Problem: Skin Integrity: Goal: Wound healing without signs and symptoms of infection Outcome: Progressing Goal: Risk for impaired skin integrity will decrease Outcome: Progressing   Problem: Urinary Elimination: Goal: Ability to achieve and maintain adequate renal perfusion and functioning will improve Outcome: Progressing   

## 2022-08-26 NOTE — Progress Notes (Addendum)
TCTS DAILY ICU PROGRESS NOTE                   [employer]            [place] [address]          [phone]   2 Days Post-Op Procedure(s) (LRB): CORONARY ARTERY BYPASS GRAFTING (CABG) x3, USING LEFT INTERNAL MAMMARY ARTERY AND ENDOSCOPICALLY HARVESTED RIGHT GREATER SAPHENOUS VEIN (N/A) TRANSESOPHAGEAL ECHOCARDIOGRAM (TEE) (N/A)  Total Length of Stay:  LOS: 7 days   Subjective: Feels pretty well, pain control is good  Objective: Vital signs in last 24 hours: Temp:  [98 F (36.7 C)-100 F (37.8 C)] 98.4 F (36.9 C) (10/26 0445) Pulse Rate:  [67-81] 71 (10/26 0700) Cardiac Rhythm: Normal sinus rhythm (10/26 0700) Resp:  [14-27] 14 (10/26 0700) BP: (98-122)/(49-100) 109/61 (10/26 0700) SpO2:  [92 %-98 %] 93 % (10/26 0700) Arterial Line BP: (93-121)/(48-60) 121/60 (10/25 1700) Weight:  [91.6 kg] 91.6 kg (10/26 0500)  Filed Weights   08/24/22 1035 08/25/22 0500 08/26/22 0500  Weight: 89.4 kg 91.2 kg 91.6 kg    Weight change: 2.268 kg   Hemodynamic parameters for last 24 hours: CVP:  [1 mmHg-14 mmHg] 1 mmHg  Intake/Output from previous day: 10/25 0701 - 10/26 0700 In: 823.1 [P.O.:360; I.V.:163; IV Piggyback:300.1] Out: 1125 [Urine:860; Chest Tube:265]  Intake/Output this shift: No intake/output data recorded.  Current Meds: Scheduled Meds:  acetaminophen  1,000 mg Oral Q6H   Or   acetaminophen (TYLENOL) oral liquid 160 mg/5 mL  1,000 mg Per Tube Q6H   aspirin EC  81 mg Oral Daily   Or   aspirin  81 mg Per Tube Daily   atorvastatin  80 mg Oral Daily   bisacodyl  10 mg Oral Daily   Or   bisacodyl  10 mg Rectal Daily   carvedilol  3.125 mg Oral BID WC   Chlorhexidine Gluconate Cloth  6 each Topical Daily   clopidogrel  75 mg Oral Daily   docusate sodium  200 mg Oral Daily   enoxaparin (LOVENOX) injection  30 mg Subcutaneous QHS   insulin aspart  0-24 Units Subcutaneous TID AC & HS   pantoprazole  40 mg Oral Daily   sodium chloride flush  3  mL Intravenous Q12H   Continuous Infusions:  sodium chloride Stopped (08/25/22 1330)   sodium chloride     sodium chloride      ceFAZolin (ANCEF) IV Stopped (08/26/22 9937)   lactated ringers     lactated ringers Stopped (08/25/22 1328)   PRN Meds:.sodium chloride, metoprolol tartrate, midazolam, morphine injection, ondansetron (ZOFRAN) IV, oxyCODONE, sodium chloride flush, traMADol  General appearance: alert, cooperative, and no distress Heart: regular rate and rhythm Lungs: clear to auscultation bilaterally Abdomen: Benign exam Extremities: Minor edema Wound: Dressings clean dry and intact  Lab Results: CBC: Recent Labs    08/25/22 2025 08/26/22 0347  WBC 8.0 6.4  HGB 10.5* 9.6*  HCT 30.7* 28.8*  PLT 87* 72*   BMET:  Recent Labs    08/25/22 2025 08/26/22 0347  NA 136 136  K 3.9 4.1  CL 106 104  CO2 24 26  GLUCOSE 188* 144*  BUN 17 15  CREATININE 1.08 0.93  CALCIUM 8.3* 8.3*    CMET: Lab Results  Component Value Date   WBC 6.4 08/26/2022   HGB 9.6 (L) 08/26/2022   HCT 28.8 (L) 08/26/2022   PLT 72 (L) 08/26/2022   GLUCOSE 144 (  H) 08/26/2022   CHOL 132 08/20/2022   TRIG 62 08/20/2022   HDL 44 08/20/2022   LDLCALC 76 08/20/2022   ALT 16 09/14/2019   AST 39 09/14/2019   NA 136 08/26/2022   K 4.1 08/26/2022   CL 104 08/26/2022   CREATININE 0.93 08/26/2022   BUN 15 08/26/2022   CO2 26 08/26/2022   INR 1.5 (H) 08/24/2022   HGBA1C 5.4 08/20/2022      PT/INR:  Recent Labs    08/24/22 1816  LABPROT 17.8*  INR 1.5*   Radiology: No results found.   Assessment/Plan: S/P Procedure(s) (LRB): CORONARY ARTERY BYPASS GRAFTING (CABG) x3, USING LEFT INTERNAL MAMMARY ARTERY AND ENDOSCOPICALLY HARVESTED RIGHT GREATER SAPHENOUS VEIN (N/A) TRANSESOPHAGEAL ECHOCARDIOGRAM (TEE) (N/A) POD #2  1 Tmax 100, SBP 90s to 120s, sinus rhythm with occasional PVCs, on no pressors or inotropes, Plavix has been started 2 oxygen saturations good on room air 3 weight  up approximately 2 kg greater than preop, adequate urine output-not all measured-we will add Lasix short-term 4 chest tube with 265 cc of drainage/24 H, should be able to remove soon 5 sugars controlled 6 normal renal function, electrolytes okay 7 expected acute blood loss anemia slightly lower trend over time, continues to equilibrate, not at transfusion threshold 8 thrombocytopenia-trend is slowly lowering over time, platelet count is 72 K today, most likely reactive but if continues to decrease may require further evaluation 9 chest x-ray-some perivascular fullness, minor atelectasis 10 routine pulmonary hygiene and cardiac rehab   John Giovanni PA-C Pager [phone] 08/26/2022 7:42 AM   Agree with above Will transfer to floor

## 2022-08-26 NOTE — Progress Notes (Signed)
Patient ID: Randy Myers, male   DOB: Mar 06, 1946, 76 y.o.   MRN: [id number]  TCTS Evening Rounds:  Martin Majestic into atrial fib this afternoon with vent rate of 180's. Amio started and has had two boluses and on drip. Still 150. May need another bolus.  UO ok CT output ok  Preop EF 30-35%  Will hold off on transfer to floor today.

## 2022-08-27 ENCOUNTER — Other Ambulatory Visit (HOSPITAL_COMMUNITY): Payer: Self-pay

## 2022-08-27 LAB — BPAM RBC
Blood Product Expiration Date: 202311192359
Blood Product Expiration Date: 202311192359
Unit Type and Rh: 6200
Unit Type and Rh: 6200

## 2022-08-27 LAB — TYPE AND SCREEN
ABO/RH(D): A POS
Antibody Screen: NEGATIVE
Unit division: 0
Unit division: 0

## 2022-08-27 LAB — CBC
HCT: 30.9 % — ABNORMAL LOW (ref 39.0–52.0)
Hemoglobin: 10.3 g/dL — ABNORMAL LOW (ref 13.0–17.0)
MCH: 30.9 pg (ref 26.0–34.0)
MCHC: 33.3 g/dL (ref 30.0–36.0)
MCV: 92.8 fL (ref 80.0–100.0)
Platelets: 93 10*3/uL — ABNORMAL LOW (ref 150–400)
RBC: 3.33 MIL/uL — ABNORMAL LOW (ref 4.22–5.81)
RDW: 13.6 % (ref 11.5–15.5)
WBC: 9 10*3/uL (ref 4.0–10.5)
nRBC: 0 % (ref 0.0–0.2)

## 2022-08-27 LAB — BASIC METABOLIC PANEL
Anion gap: 6 (ref 5–15)
BUN: 20 mg/dL (ref 8–23)
CO2: 25 mmol/L (ref 22–32)
Calcium: 8.3 mg/dL — ABNORMAL LOW (ref 8.9–10.3)
Chloride: 107 mmol/L (ref 98–111)
Creatinine, Ser: 0.86 mg/dL (ref 0.61–1.24)
GFR, Estimated: >60 mL/min (ref >60)
Glucose, Bld: 119 mg/dL — ABNORMAL HIGH (ref 70–99)
Potassium: 4.2 mmol/L (ref 3.5–5.1)
Sodium: 138 mmol/L (ref 135–145)

## 2022-08-27 LAB — GLUCOSE, CAPILLARY
Glucose-Capillary: 105 mg/dL — ABNORMAL HIGH (ref 70–99)
Glucose-Capillary: 91 mg/dL (ref 70–99)
Glucose-Capillary: 124 mg/dL — ABNORMAL HIGH (ref 70–99)
Glucose-Capillary: 87 mg/dL (ref 70–99)

## 2022-08-27 MED ORDER — POLYETHYLENE GLYCOL 3350 17 G PO PACK
17.0000 g | PACK | Freq: Two times a day (BID) | ORAL | Status: DC | PRN
  Administered 2022-08-28: 17 g via ORAL
  Filled 2022-08-27 (×2): qty 1

## 2022-08-27 MED ORDER — AMIODARONE HCL IN DEXTROSE 360-4.14 MG/200ML-% IV SOLN
60.0000 mg/h | INTRAVENOUS | Status: AC
  Administered 2022-08-27: 30 mg/h via INTRAVENOUS
  Administered 2022-08-27: 60 mg/h via INTRAVENOUS
  Filled 2022-08-27: qty 200

## 2022-08-27 MED ORDER — AMIODARONE HCL 200 MG PO TABS
400.0000 mg | ORAL_TABLET | Freq: Two times a day (BID) | ORAL | Status: DC
  Administered 2022-08-27: 400 mg via ORAL
  Filled 2022-08-27 (×2): qty 2

## 2022-08-27 MED ORDER — AMIODARONE HCL IN DEXTROSE 360-4.14 MG/200ML-% IV SOLN: 30.0000 mg/h | INTRAVENOUS | Status: DC

## 2022-08-27 MED ORDER — DOCUSATE SODIUM 100 MG PO CAPS
100.0000 mg | ORAL_CAPSULE | Freq: Two times a day (BID) | ORAL | Status: DC | PRN
  Administered 2022-08-27 – 2022-08-28 (×2): 100 mg via ORAL
  Filled 2022-08-27 (×2): qty 1

## 2022-08-27 MED ORDER — POTASSIUM CHLORIDE CRYS ER 20 MEQ PO TBCR
20.0000 meq | EXTENDED_RELEASE_TABLET | Freq: Every day | ORAL | Status: DC
  Administered 2022-08-27 – 2022-08-29 (×3): 20 meq via ORAL
  Filled 2022-08-27 (×3): qty 1

## 2022-08-27 NOTE — Progress Notes (Signed)
   Heart Failure Stewardship Pharmacist Progress Note   PCP: [name], MD PCP-Cardiologist: None    HPI:  Randy Myers 76 YO male with PMH significant for CVA, prostate cancer, CABG x3, and HFrEF.He reported chest pain and SOB, n/v after walking short distances to his PCP on 08/11/2022. On 10/12, he saw cardiology and reported chest pain and SOB progressing over 2 weeks. EKG at that time showed NSR with non-specific ST changes with inferior infarct. He presented on 10/19 for elective cardiac catheterization. Procedure showed severe three vessel CAD with total occulusion of the mid RCA, sub-total occulusion of the mid LAD, and severe segmental LV dysfunction.CABG X3 was performed on 08/24/22. He is currently volume overloaded with 11 lbs of weight gain from pre-op status.   ECHO on 10/19 showed EF 30-35% with moderate to severe LV dysfunction and grade I diastolic dysfunction.   Current HF Medications: Diuretic: furosemide '40mg'$  once daily  Beta Blocker: carvedilol 3.'125mg'$  BID   Prior to admission HF Medications: None  Pertinent Lab Values: Serum creatinine 0.86, BUN 20, Potassium 4.2, Sodium 138, Magnesium 2.0, A1c 5.4 Vital Signs: Weight: 202 lbs (admission weight: 195 lbs) Blood pressure: 110-130s/ 50-100s Heart rate: 60-120s  I/O: -0.235L yesterday; net -1.640L  Medication Assistance / Insurance Benefits Check: Does the patient have prescription insurance?  Yes Type of insurance plan: [employer]  Does the patient qualify for medication assistance through manufacturers or grants?   Pending Eligible grants and/or patient assistance programs: pending Medication assistance applications in progress: none  Medication assistance applications approved: none Approved medication assistance renewals will be completed by: pending  Outpatient Pharmacy:  Prior to admission outpatient pharmacy: [facility] Is the patient willing to use [facility] at discharge?  Pending Is the patient willing to transition their outpatient pharmacy to utilize a [facility] outpatient pharmacy?   Pending    Assessment: 1. Acute on chronic systolic CHF (LVEF 81-38%), due to ICM. NYHA class III symptoms. - Strict I/Os. Monitor daily standing weight. Keep K>4 and Mg >2. - Currently volume overloaded. Agree with Lasix dose increase. May need to consider IV diuretic therapy if no improvements in volume status. - Agree with continuing carvedilol 3.'125mg'$  BID - Recommend initiation of SGLT2i therapy at this time -Will consider MRA and RAAS therapy if BP allows later during hospitalization.   Plan: 1) Medication changes recommended at this time: -Initiate Jardiance '10mg'$  once daily   2) Patient assistance: -Copays: Delene Loll and Jardiance $45 and Farxiga $95  3)  Education  - Initial education completed - Full education to be completed prior to discharge.  Randy Myers, PharmD PGY-1 [facility] Resident

## 2022-08-27 NOTE — Progress Notes (Signed)
Heart Failure Navigator Progress Note  Following this hospitalization to assess for HV TOC readiness.   EF t30-35% G1DD CABG 08/24/22 A Fib with RVR 10/26 in the 180's, IV amino/ boluses given, patient converted overnight. Plan to change  to PO on 08/27/22 and keep in ICU for day.  Randy Myers, BSN, Clinical cytogeneticist Only

## 2022-08-27 NOTE — Progress Notes (Signed)
     [address]       ,[place] [address]             [phone]       EVENING ROUNDS   POD #2 sp CABG Issues with rapid afib now back on infusion of amiodorone Now converted back to NSR Transition back to PO amiodarone tomorrow

## 2022-08-27 NOTE — Progress Notes (Signed)
3 Days Post-Op Procedure(s) (LRB): CORONARY ARTERY BYPASS GRAFTING (CABG) x3, USING LEFT INTERNAL MAMMARY ARTERY AND ENDOSCOPICALLY HARVESTED RIGHT GREATER SAPHENOUS VEIN (N/A) TRANSESOPHAGEAL ECHOCARDIOGRAM (TEE) (N/A) Subjective: No complaints.  Ambulated this am.  Objective: Vital signs in last 24 hours: Temp:  [97.7 F (36.5 C)-98.5 F (36.9 C)] 98 F (36.7 C) (10/27 0430) Pulse Rate:  [63-201] 78 (10/27 0700) Cardiac Rhythm: Atrial fibrillation (10/26 2000) Resp:  [17-36] 25 (10/27 0700) BP: (92-155)/(14-142) 110/63 (10/27 0700) SpO2:  [85 %-100 %] 94 % (10/27 0700) Weight:  [91.8 kg] 91.8 kg (10/27 0500)  Hemodynamic parameters for last 24 hours:    Intake/Output from previous day: 10/26 0701 - 10/27 0700 In: 1186.2 [P.O.:710; I.V.:398.8; IV Piggyback:77.5] Out: 1600 [Urine:1500; Chest Tube:100] Intake/Output this shift: No intake/output data recorded.  General appearance: alert and cooperative Neurologic: intact Heart: regular rate and rhythm, S1, S2 normal, no murmur Lungs: clear to auscultation bilaterally Extremities: edema mild Wound: dressing dry  Lab Results: Recent Labs    08/26/22 0347 08/27/22 0524  WBC 6.4 9.0  HGB 9.6* 10.3*  HCT 28.8* 30.9*  PLT 72* 93*   BMET:  Recent Labs    08/26/22 0347 08/27/22 0524  NA 136 138  K 4.1 4.2  CL 104 107  CO2 26 25  GLUCOSE 144* 119*  BUN 15 20  CREATININE 0.93 0.86  CALCIUM 8.3* 8.3*    PT/INR:  Recent Labs    08/24/22 1816  LABPROT 17.8*  INR 1.5*   ABG    Component Value Date/Time   PHART 7.370 08/25/2022 0132   HCO3 22.7 08/25/2022 0132   TCO2 24 08/25/2022 0132   ACIDBASEDEF 2.0 08/25/2022 0132   O2SAT 99 08/25/2022 0132   CBG (last 3)  Recent Labs    08/26/22 1104 08/26/22 1541 08/26/22 2110  GLUCAP 138* 174* 191*    Assessment/Plan: S/P Procedure(s) (LRB): CORONARY ARTERY BYPASS GRAFTING (CABG) x3, USING LEFT INTERNAL MAMMARY ARTERY AND ENDOSCOPICALLY HARVESTED RIGHT  GREATER SAPHENOUS VEIN (N/A) TRANSESOPHAGEAL ECHOCARDIOGRAM (TEE) (N/A)  POD 3 Hemodynamically stable. Preop EF 30-35%.  Postop atrial fib with RVR 180's yesterday, converted to sinus 70's overnight with amiodarone after several boluses. He only has a peripheral IV so will convert to PO today.If any further AF he can be treated with additional boluses.  Volume excess: wt is 11 lbs over preop. On lasix and Kdur daily.  DM: glucose under adequate control on SSI. On no meds preop with Hgb A1c of 5.4.  Continue IS, ambulation. Will keep in ICU today to be sure HR is stable after transition to po amio since he has low EF and had such a high HR with AF.   LOS: 8 days    Randy Myers 08/27/2022

## 2022-08-27 NOTE — Progress Notes (Signed)
     [address]       [place] [address]             [phone]       Back in sinus Remains on amio gtt Will transition to PO likely tomorrow Floor tomorrow  Lajuana Matte

## 2022-08-28 ENCOUNTER — Inpatient Hospital Stay (HOSPITAL_COMMUNITY): Payer: Medicare HMO

## 2022-08-28 LAB — GLUCOSE, CAPILLARY
Glucose-Capillary: 141 mg/dL — ABNORMAL HIGH (ref 70–99)
Glucose-Capillary: 145 mg/dL — ABNORMAL HIGH (ref 70–99)
Glucose-Capillary: 96 mg/dL (ref 70–99)
Glucose-Capillary: 95 mg/dL (ref 70–99)

## 2022-08-28 LAB — MAGNESIUM: Magnesium: 1.7 mg/dL (ref 1.7–2.4)

## 2022-08-28 MED ORDER — METOCLOPRAMIDE HCL 5 MG/ML IJ SOLN
10.0000 mg | Freq: Three times a day (TID) | INTRAMUSCULAR | Status: AC
  Administered 2022-08-28 – 2022-08-29 (×4): 10 mg via INTRAVENOUS
  Filled 2022-08-28 (×4): qty 2

## 2022-08-28 MED ORDER — AMIODARONE IV BOLUS ONLY 150 MG/100ML: 150.0000 mg | Freq: Once | INTRAVENOUS | Status: DC

## 2022-08-28 MED ORDER — AMIODARONE IV BOLUS ONLY 150 MG/100ML
150.0000 mg | Freq: Once | INTRAVENOUS | Status: AC
  Administered 2022-08-28: 150 mg via INTRAVENOUS

## 2022-08-28 MED ORDER — AMIODARONE HCL 200 MG PO TABS
200.0000 mg | ORAL_TABLET | Freq: Two times a day (BID) | ORAL | Status: DC
  Administered 2022-08-28 – 2022-08-29 (×3): 200 mg via ORAL
  Filled 2022-08-28 (×3): qty 1

## 2022-08-28 MED ORDER — SODIUM CHLORIDE 0.9 % IV SOLN: 250.0000 mL | INTRAVENOUS | Status: DC | PRN

## 2022-08-28 MED ORDER — AMIODARONE LOAD VIA INFUSION
150.0000 mg | Freq: Once | INTRAVENOUS | Status: AC
  Administered 2022-08-28: 150 mg via INTRAVENOUS
  Filled 2022-08-28: qty 83.34

## 2022-08-28 MED ORDER — SODIUM CHLORIDE 0.9% FLUSH
3.0000 mL | Freq: Two times a day (BID) | INTRAVENOUS | Status: DC
  Administered 2022-08-28 – 2022-08-29 (×3): 3 mL via INTRAVENOUS

## 2022-08-28 MED ORDER — SODIUM CHLORIDE 0.9% FLUSH: 3.0000 mL | INTRAVENOUS | Status: DC | PRN

## 2022-08-28 MED ORDER — AMIODARONE HCL IN DEXTROSE 360-4.14 MG/200ML-% IV SOLN
30.0000 mg/h | INTRAVENOUS | Status: DC
  Administered 2022-08-28: 30 mg/h via INTRAVENOUS
  Filled 2022-08-28: qty 200

## 2022-08-28 MED ORDER — ~~LOC~~ CARDIAC SURGERY, PATIENT & FAMILY EDUCATION: Freq: Once | Status: AC

## 2022-08-28 NOTE — Progress Notes (Signed)
     [address]       [place] [address]             [phone]       EVENING ROUNDS  Has remained in NSR off infusion of amio Hemodynamics good Transfer to floor

## 2022-08-28 NOTE — Progress Notes (Signed)
      [address]       [place] [address]             [phone]      4 Days Post-Op  Procedure(s) (LRB): CORONARY ARTERY BYPASS GRAFTING (CABG) x3, USING LEFT INTERNAL MAMMARY ARTERY AND ENDOSCOPICALLY HARVESTED RIGHT GREATER SAPHENOUS VEIN (N/A) TRANSESOPHAGEAL ECHOCARDIOGRAM (TEE) (N/A)   Total Length of Stay:  LOS: 9 days    SUBJECTIVE: Comfortable no complaints Walked in unit already Has remained in NSR on infusion Has no taste Vitals:   08/28/22 0830 08/28/22 0900  BP: 117/71 101/71  Pulse: (!) 55 65  Resp: 16 19  Temp:    SpO2: 96% 96%    Intake/Output      10/27 0701 10/28 0700 10/28 0701 10/29 0700   P.O. 440    I.V. (mL/kg) 389.6 (4.4)    IV Piggyback     Total Intake(mL/kg) 829.6 (9.4)    Urine (mL/kg/hr) 2300 (1.1)    Emesis/NG output     Chest Tube     Total Output 2300    Net -1470.4             amiodarone 30 mg/hr (08/28/22 0503)   lactated ringers      CBC    Component Value Date/Time   WBC 9.0 08/27/2022 0524   RBC 3.33 (L) 08/27/2022 0524   HGB 10.3 (L) 08/27/2022 0524   HGB 14.1 08/12/2022 1528   HCT 30.9 (L) 08/27/2022 0524   HCT 40.9 08/12/2022 1528   PLT 93 (L) 08/27/2022 0524   PLT 165 08/12/2022 1528   MCV 92.8 08/27/2022 0524   MCV 90 08/12/2022 1528   MCH 30.9 08/27/2022 0524   MCHC 33.3 08/27/2022 0524   RDW 13.6 08/27/2022 0524   RDW 13.8 08/12/2022 1528   LYMPHSABS 0.6 (L) 09/15/2019 0435   MONOABS 0.9 09/15/2019 0435   EOSABS 0.0 09/15/2019 0435   BASOSABS 0.0 09/15/2019 0435   CMP     Component Value Date/Time   NA 138 08/27/2022 0524   NA 140 08/12/2022 1528   K 4.2 08/27/2022 0524   CL 107 08/27/2022 0524   CO2 25 08/27/2022 0524   GLUCOSE 119 (H) 08/27/2022 0524   BUN 20 08/27/2022 0524   BUN 27 08/12/2022 1528   CREATININE 0.86 08/27/2022 0524   CALCIUM 8.3 (L) 08/27/2022 0524   PROT 5.6 (L) 09/14/2019 2053   ALBUMIN 3.3 (L) 09/14/2019 2053   AST 39 09/14/2019 2053   ALT  16 09/14/2019 2053   ALKPHOS 47 09/14/2019 2053   BILITOT 0.7 09/14/2019 2053   GFRNONAA >60 08/27/2022 0524   GFRAA >60 09/19/2019 0157   ABG    Component Value Date/Time   PHART 7.370 08/25/2022 0132   PCO2ART 39.2 08/25/2022 0132   PO2ART 167 (H) 08/25/2022 0132   HCO3 22.7 08/25/2022 0132   TCO2 24 08/25/2022 0132   ACIDBASEDEF 2.0 08/25/2022 0132   O2SAT 99 08/25/2022 0132   CBG (last 3)  Recent Labs    08/27/22 1621 08/27/22 2111 08/28/22 0905  GLUCAP 124* 87 141*   EXAM Lungs: clear Card: RR Wounds; dry Ext: Warm   ASSESSMENT: POD # 3 SP CABG Afib: stable on infusion and will switch to po. If maintains NSR this afternoon will transfer to floor CXR: clear this am Lasix   Coralie Common, MD '@DATE'$ @

## 2022-08-29 DIAGNOSIS — I200 Unstable angina: Secondary | ICD-10-CM | POA: Diagnosis not present

## 2022-08-29 LAB — GLUCOSE, CAPILLARY
Glucose-Capillary: 118 mg/dL — ABNORMAL HIGH (ref 70–99)
Glucose-Capillary: 206 mg/dL — ABNORMAL HIGH (ref 70–99)
Glucose-Capillary: 117 mg/dL — ABNORMAL HIGH (ref 70–99)
Glucose-Capillary: 103 mg/dL — ABNORMAL HIGH (ref 70–99)
Glucose-Capillary: 105 mg/dL — ABNORMAL HIGH (ref 70–99)

## 2022-08-29 LAB — BASIC METABOLIC PANEL
Anion gap: 7 (ref 5–15)
BUN: 12 mg/dL (ref 8–23)
CO2: 27 mmol/L (ref 22–32)
Calcium: 8.4 mg/dL — ABNORMAL LOW (ref 8.9–10.3)
Chloride: 105 mmol/L (ref 98–111)
Creatinine, Ser: 0.91 mg/dL (ref 0.61–1.24)
GFR, Estimated: >60 mL/min (ref >60)
Glucose, Bld: 116 mg/dL — ABNORMAL HIGH (ref 70–99)
Potassium: 3.8 mmol/L (ref 3.5–5.1)
Sodium: 139 mmol/L (ref 135–145)

## 2022-08-29 LAB — CBC
HCT: 29.6 % — ABNORMAL LOW (ref 39.0–52.0)
Hemoglobin: 10.3 g/dL — ABNORMAL LOW (ref 13.0–17.0)
MCH: 31.4 pg (ref 26.0–34.0)
MCHC: 34.8 g/dL (ref 30.0–36.0)
MCV: 90.2 fL (ref 80.0–100.0)
Platelets: 138 10*3/uL — ABNORMAL LOW (ref 150–400)
RBC: 3.28 MIL/uL — ABNORMAL LOW (ref 4.22–5.81)
RDW: 13.8 % (ref 11.5–15.5)
WBC: 5.7 10*3/uL (ref 4.0–10.5)
nRBC: 0 % (ref 0.0–0.2)

## 2022-08-29 MED ORDER — AMIODARONE HCL 200 MG PO TABS
200.0000 mg | ORAL_TABLET | Freq: Three times a day (TID) | ORAL | Status: DC
  Administered 2022-08-29: 200 mg via ORAL
  Filled 2022-08-29: qty 1

## 2022-08-29 MED ORDER — AMIODARONE IV BOLUS ONLY 150 MG/100ML
150.0000 mg | Freq: Once | INTRAVENOUS | Status: AC
  Administered 2022-08-29: 150 mg via INTRAVENOUS
  Filled 2022-08-29: qty 100

## 2022-08-29 MED ORDER — AMIODARONE IV BOLUS ONLY 150 MG/100ML: 150.0000 mg | Freq: Once | INTRAVENOUS | Status: DC

## 2022-08-29 MED ORDER — POTASSIUM CHLORIDE CRYS ER 20 MEQ PO TBCR
20.0000 meq | EXTENDED_RELEASE_TABLET | Freq: Two times a day (BID) | ORAL | Status: DC
  Administered 2022-08-29 – 2022-08-30 (×3): 20 meq via ORAL
  Filled 2022-08-29 (×3): qty 1

## 2022-08-29 MED ORDER — FUROSEMIDE 40 MG PO TABS
40.0000 mg | ORAL_TABLET | Freq: Two times a day (BID) | ORAL | Status: DC
  Administered 2022-08-29 – 2022-08-30 (×3): 40 mg via ORAL
  Filled 2022-08-29 (×3): qty 1

## 2022-08-29 MED ORDER — AMIODARONE HCL 200 MG PO TABS
400.0000 mg | ORAL_TABLET | Freq: Two times a day (BID) | ORAL | Status: DC
  Administered 2022-08-29 – 2022-09-02 (×8): 400 mg via ORAL
  Filled 2022-08-29 (×8): qty 2

## 2022-08-29 MED ORDER — APIXABAN 5 MG PO TABS
5.0000 mg | ORAL_TABLET | Freq: Two times a day (BID) | ORAL | Status: DC
  Administered 2022-08-29 – 2022-09-02 (×9): 5 mg via ORAL
  Filled 2022-08-29 (×9): qty 1

## 2022-08-29 NOTE — Consult Note (Signed)
NAME:  Randy Myers, MRN:  [id number], DOB:  1946/04/06, LOS: 66 ADMISSION DATE:  08/19/2022, CONSULTATION DATE:  08/29/22 REFERRING MD:  Kipp Brood, CHIEF COMPLAINT:  chest pain   History of Present Illness:  76 year old man with PMH below presenting with anginal symptoms found to have ischemic cardiomyopathy and severe multivessel CAD.  He undewent CABG x 3 (LIMA-LAD, RSVG PDA/OM) on 08/24/22.  Postoperative course complicated by persistent afib that has been difficult to control.  PCCM consulted for postop management.   Currently no complaints, has some DOE.  Able to ambulate.  Pain under control.  Pertinent  Medical History   Past Medical History:  Diagnosis Date   Arthritis    Coronary artery disease    Hypertension    Migraine    Multiple vessel coronary artery disease 08/20/2022   Presented with unstable angina 08/19/2022: Cath -> ost-prox RCA 40%, prox-mid RCA 100% (~CTO w/ L-R collaterals); Ost-Prox LAD 40% prox-midLAD 99% (appears to be subtotally occluded at major SP branch.); Prox-mid LCx 70%, OM2 70%.  LVEF 25 to 35% (moderate to severely reduced function.  Severe HK of anterior wall 15 mmHg). ==> CABG, concern for PCI of the LAD could jeopardize large SP branch. => W   Prostate cancer ([place])    Prostate cancer ([place])    with radiation   Stroke ([place]) 06/10/2013   Stroke ([place])      Significant Hospital Events: Including procedures, antibiotic start and stop dates in addition to other pertinent events   08/24/22 CABG X 3, LIMA LAD, RSVG PDA, OM   Endoscopic greater saphenous vein harvest on the right Xclamp 15mn  Interim History / Subjective:  Consulted  Objective   Blood pressure (!) 88/58, pulse 97, temperature (!) 97.5 F (36.4 C), temperature source Oral, resp. rate 20, height '5\' 11"'$  (1.803 m), weight 88.7 kg, SpO2 98 %.        Intake/Output Summary (Last 24 hours) at 08/29/2022 1119 Last data filed at 08/28/2022 2229 Gross per 24 hour  Intake 444.22 ml   Output --  Net 444.22 ml   Filed Weights   08/27/22 0500 08/28/22 0500 08/29/22 0500  Weight: 91.8 kg 88.5 kg 88.7 kg    Examination: General: no distress HENT: MMM, trachea midline Lungs: clear, no wheezing Cardiovascular: irregular, tachy, ext warm Abdomen: soft, +BS Extremities: no edema Neuro: moves all 4 ext to command Skin: sternotomy site CDI  Resolved Hospital Problem list   N/A  Assessment & Plan:  Ischemic cardiomyopathy, multivessel CAD s/p 3 vessel CABG 08/24/22 Postop anemia, thrombocytopenia stable Postop afib persistent Postop volume overload Hx prostate cancer Hx CVA  - Afib management per primary - Encourage mobility and IS - Agree with diuresis  Best Practice (right click and "Reselect all SmartList Selections" daily)  Per primary  Labs   CBC: Recent Labs  Lab 08/25/22 0914 08/25/22 2025 08/26/22 0347 08/27/22 0524 08/29/22 0419  WBC 8.2 8.0 6.4 9.0 5.7  HGB 10.5* 10.5* 9.6* 10.3* 10.3*  HCT 31.8* 30.7* 28.8* 30.9* 29.6*  MCV 92.4 93.3 93.5 92.8 90.2  PLT 92* 87* 72* 93* 138*    Basic Metabolic Panel: Recent Labs  Lab 08/25/22 0346 08/25/22 0914 08/25/22 2025 08/26/22 0347 08/27/22 0524 08/28/22 0502 08/29/22 0419  NA 137  --  136 136 138  --  139  K 4.3  --  3.9 4.1 4.2  --  3.8  CL 107  --  106 104 107  --  105  CO2 22  --  '24 26 25  '$ --  27  GLUCOSE 151*  --  188* 144* 119*  --  116*  BUN 16  --  '17 15 20  '$ --  12  CREATININE 0.89 1.10 1.08 0.93 0.86  --  0.91  CALCIUM 8.3*  --  8.3* 8.3* 8.3*  --  8.4*  MG 2.5*  --  2.0  --   --  1.7  --    GFR: Estimated Creatinine Clearance: 73.6 mL/min (by C-G formula based on SCr of 0.91 mg/dL). Recent Labs  Lab 08/25/22 2025 08/26/22 0347 08/27/22 0524 08/29/22 0419  WBC 8.0 6.4 9.0 5.7    Liver Function Tests: No results for input(s): "AST", "ALT", "ALKPHOS", "BILITOT", "PROT", "ALBUMIN" in the last 168 hours. No results for input(s): "LIPASE", "AMYLASE" in the last 168  hours. No results for input(s): "AMMONIA" in the last 168 hours.  ABG    Component Value Date/Time   PHART 7.370 08/25/2022 0132   PCO2ART 39.2 08/25/2022 0132   PO2ART 167 (H) 08/25/2022 0132   HCO3 22.7 08/25/2022 0132   TCO2 24 08/25/2022 0132   ACIDBASEDEF 2.0 08/25/2022 0132   O2SAT 99 08/25/2022 0132     Coagulation Profile: Recent Labs  Lab 08/23/22 1050 08/24/22 1816  INR 1.1 1.5*    Cardiac Enzymes: No results for input(s): "CKTOTAL", "CKMB", "CKMBINDEX", "TROPONINI" in the last 168 hours.  HbA1C: Hgb A1c MFr Bld  Date/Time Value Ref Range Status  08/20/2022 03:00 AM 5.4 4.8 - 5.6 % Final    Comment:    (NOTE) Pre diabetes:          5.7%-6.4%  Diabetes:              >6.4%  Glycemic control for   <7.0% adults with diabetes   09/15/2019 04:35 AM 5.8 (H) 4.8 - 5.6 % Final    Comment:    (NOTE) Pre diabetes:          5.7%-6.4% Diabetes:              >6.4% Glycemic control for   <7.0% adults with diabetes     CBG: Recent Labs  Lab 08/28/22 1151 08/28/22 1618 08/28/22 2154 08/29/22 0615 08/29/22 0851  GLUCAP 145* 96 95 118* 206*    Review of Systems:    Positive Symptoms in bold:  Constitutional fevers, chills, weight loss, fatigue, anorexia, malaise  Eyes decreased vision, double vision, eye irritation  Ears, Nose, Mouth, Throat sore throat, trouble swallowing, sinus congestion  Cardiovascular chest pain, paroxysmal nocturnal dyspnea, lower ext edema, palpitations   Respiratory SOB, cough, DOE, hemoptysis, wheezing  Gastrointestinal nausea, vomiting, diarrhea  Genitourinary burning with urination, trouble urinating  Musculoskeletal joint aches, joint swelling, back pain  Integumentary  rashes, skin lesions  Neurological focal weakness, focal numbness, trouble speaking, headaches  Psychiatric depression, anxiety, confusion  Endocrine polyuria, polydipsia, cold intolerance, heat intolerance  Hematologic abnormal bruising, abnormal  bleeding, unexplained nose bleeds  Allergic/Immunologic recurrent infections, hives, swollen lymph nodes     Past Medical History:  He,  has a past medical history of Arthritis, Coronary artery disease, Hypertension, Migraine, Multiple vessel coronary artery disease (08/20/2022), Prostate cancer [facility]), Prostate cancer ([place]), Stroke ([place]) (06/10/2013), and Stroke ([place]).   Surgical History:   Past Surgical History:  Procedure Laterality Date   BACK SURGERY  1983   BACK SURGERY     CORONARY ARTERY BYPASS GRAFT N/A 08/24/2022   Procedure: CORONARY ARTERY BYPASS GRAFTING (  CABG) x3, USING LEFT INTERNAL MAMMARY ARTERY AND ENDOSCOPICALLY HARVESTED RIGHT GREATER SAPHENOUS VEIN;  Surgeon: Lajuana Matte, MD;  Location: [facility];  Service: Open Heart Surgery;  Laterality: N/A;   EYE SURGERY     LEFT HEART CATH AND CORONARY ANGIOGRAPHY N/A 08/19/2022   Procedure: LEFT HEART CATH AND CORONARY ANGIOGRAPHY;  Surgeon: Burnell Blanks, MD;  Location: [facility];  Service: Cardiovascular;  Laterality: N/A;   PROSTATE BIOPSY     TEE WITHOUT CARDIOVERSION N/A 08/24/2022   Procedure: TRANSESOPHAGEAL ECHOCARDIOGRAM (TEE);  Surgeon: Lajuana Matte, MD;  Location: [facility];  Service: Open Heart Surgery;  Laterality: N/A;     Social History:   reports that he has never smoked. He has never used smokeless tobacco. He reports that he does not drink alcohol and does not use drugs.   Family History:  His family history includes Brain cancer in his sister; CAD in his father; Cancer in his sister; Heart disease in his father.   Allergies No Known Allergies   Home Medications  Prior to Admission medications   Medication Sig Start Date End Date Taking? Authorizing Provider  acetaminophen (TYLENOL) 650 MG CR tablet Take 650 mg by mouth every 8 (eight) hours as needed for pain.   Yes [provider]  atorvastatin (LIPITOR) 10 MG tablet Take 10 mg by mouth daily after supper. 05/16/19   Yes [provider]  clopidogrel (PLAVIX) 75 MG tablet Take 75 mg by mouth daily after supper. 05/19/19  Yes [provider]  famotidine (PEPCID) 40 MG tablet Take 40 mg by mouth daily after supper. 07/17/19  Yes [provider]  lisinopril (ZESTRIL) 5 MG tablet Take 5 mg by mouth daily after supper. 05/16/19  Yes [provider]  Multiple Vitamin (MULTIVITAMIN WITH MINERALS) TABS tablet Take 1 tablet by mouth daily with breakfast.   Yes [provider]  omeprazole (PRILOSEC) 40 MG capsule Take 40 mg by mouth daily with breakfast.  08/09/19  Yes [provider]

## 2022-08-29 NOTE — Progress Notes (Signed)
     [address]       [place] [address]             [phone]       EVENING ROUNDS  Has maintained NSR even after walks Feels good Hopefully transfer to floor in am

## 2022-08-29 NOTE — Progress Notes (Signed)
      [address]       [place] [address]             [phone]      5 Days Post-Op  Procedure(s) (LRB): CORONARY ARTERY BYPASS GRAFTING (CABG) x3, USING LEFT INTERNAL MAMMARY ARTERY AND ENDOSCOPICALLY HARVESTED RIGHT GREATER SAPHENOUS VEIN (N/A) TRANSESOPHAGEAL ECHOCARDIOGRAM (TEE) (N/A)   Total Length of Stay:  LOS: 10 days    SUBJECTIVE: Went into rapid afib after his am walk Asymptomatic  Vitals:   08/29/22 0700 08/29/22 0800  BP: 130/62 (!) 88/58  Pulse: 77 97  Resp: 19 20  Temp:    SpO2: 93% 98%    Intake/Output      10/28 0701 10/29 0700 10/29 0701 10/30 0700   P.O.     I.V. (mL/kg) 444.2 (5)    Total Intake(mL/kg) 444.2 (5)    Urine (mL/kg/hr)     Total Output     Net +444.2             sodium chloride     lactated ringers      CBC    Component Value Date/Time   WBC 5.7 08/29/2022 0419   RBC 3.28 (L) 08/29/2022 0419   HGB 10.3 (L) 08/29/2022 0419   HGB 14.1 08/12/2022 1528   HCT 29.6 (L) 08/29/2022 0419   HCT 40.9 08/12/2022 1528   PLT 138 (L) 08/29/2022 0419   PLT 165 08/12/2022 1528   MCV 90.2 08/29/2022 0419   MCV 90 08/12/2022 1528   MCH 31.4 08/29/2022 0419   MCHC 34.8 08/29/2022 0419   RDW 13.8 08/29/2022 0419   RDW 13.8 08/12/2022 1528   LYMPHSABS 0.6 (L) 09/15/2019 0435   MONOABS 0.9 09/15/2019 0435   EOSABS 0.0 09/15/2019 0435   BASOSABS 0.0 09/15/2019 0435   CMP     Component Value Date/Time   NA 139 08/29/2022 0419   NA 140 08/12/2022 1528   K 3.8 08/29/2022 0419   CL 105 08/29/2022 0419   CO2 27 08/29/2022 0419   GLUCOSE 116 (H) 08/29/2022 0419   BUN 12 08/29/2022 0419   BUN 27 08/12/2022 1528   CREATININE 0.91 08/29/2022 0419   CALCIUM 8.4 (L) 08/29/2022 0419   PROT 5.6 (L) 09/14/2019 2053   ALBUMIN 3.3 (L) 09/14/2019 2053   AST 39 09/14/2019 2053   ALT 16 09/14/2019 2053   ALKPHOS 47 09/14/2019 2053   BILITOT 0.7 09/14/2019 2053   GFRNONAA >60 08/29/2022 0419   GFRAA >60 09/19/2019  0157   ABG    Component Value Date/Time   PHART 7.370 08/25/2022 0132   PCO2ART 39.2 08/25/2022 0132   PO2ART 167 (H) 08/25/2022 0132   HCO3 22.7 08/25/2022 0132   TCO2 24 08/25/2022 0132   ACIDBASEDEF 2.0 08/25/2022 0132   O2SAT 99 08/25/2022 0132   CBG (last 3)  Recent Labs    08/28/22 2154 08/29/22 0615 08/29/22 0851  GLUCAP 95 118* 206*   EXAM Lungs: overall clear Card: Tachy Ext: still with edema  ASSESSMENT: POD #4 SP CABG Recurrent Afib: will rebolus throughout the day today and increase oral amio to TID Consider anticoagulation Increase diuresis Leave in Unit today   Coralie Common, MD '@DATE'$ @

## 2022-08-30 ENCOUNTER — Other Ambulatory Visit (HOSPITAL_COMMUNITY): Payer: Self-pay

## 2022-08-30 ENCOUNTER — Encounter (HOSPITAL_COMMUNITY): Payer: Self-pay | Admitting: Thoracic Surgery (Cardiothoracic Vascular Surgery)

## 2022-08-30 ENCOUNTER — Telehealth (HOSPITAL_COMMUNITY): Payer: Self-pay | Admitting: Pharmacy Technician

## 2022-08-30 DIAGNOSIS — Z951 Presence of aortocoronary bypass graft: Secondary | ICD-10-CM

## 2022-08-30 DIAGNOSIS — I255 Ischemic cardiomyopathy: Secondary | ICD-10-CM | POA: Diagnosis not present

## 2022-08-30 DIAGNOSIS — I4819 Other persistent atrial fibrillation: Secondary | ICD-10-CM

## 2022-08-30 DIAGNOSIS — I2510 Atherosclerotic heart disease of native coronary artery without angina pectoris: Secondary | ICD-10-CM | POA: Diagnosis not present

## 2022-08-30 DIAGNOSIS — I200 Unstable angina: Secondary | ICD-10-CM | POA: Diagnosis not present

## 2022-08-30 LAB — CBC
HCT: 31.8 % — ABNORMAL LOW (ref 39.0–52.0)
Hemoglobin: 10.7 g/dL — ABNORMAL LOW (ref 13.0–17.0)
MCH: 30.6 pg (ref 26.0–34.0)
MCHC: 33.6 g/dL (ref 30.0–36.0)
MCV: 90.9 fL (ref 80.0–100.0)
Platelets: 181 10*3/uL (ref 150–400)
RBC: 3.5 MIL/uL — ABNORMAL LOW (ref 4.22–5.81)
RDW: 13.7 % (ref 11.5–15.5)
WBC: 6.1 10*3/uL (ref 4.0–10.5)
nRBC: 0 % (ref 0.0–0.2)

## 2022-08-30 LAB — BASIC METABOLIC PANEL
Anion gap: 8 (ref 5–15)
BUN: 13 mg/dL (ref 8–23)
CO2: 26 mmol/L (ref 22–32)
Calcium: 8.5 mg/dL — ABNORMAL LOW (ref 8.9–10.3)
Chloride: 104 mmol/L (ref 98–111)
Creatinine, Ser: 1.05 mg/dL (ref 0.61–1.24)
GFR, Estimated: >60 mL/min (ref >60)
Glucose, Bld: 133 mg/dL — ABNORMAL HIGH (ref 70–99)
Potassium: 3.8 mmol/L (ref 3.5–5.1)
Sodium: 138 mmol/L (ref 135–145)

## 2022-08-30 LAB — GLUCOSE, CAPILLARY
Glucose-Capillary: 191 mg/dL — ABNORMAL HIGH (ref 70–99)
Glucose-Capillary: 102 mg/dL — ABNORMAL HIGH (ref 70–99)
Glucose-Capillary: 100 mg/dL — ABNORMAL HIGH (ref 70–99)
Glucose-Capillary: 136 mg/dL — ABNORMAL HIGH (ref 70–99)

## 2022-08-30 LAB — MAGNESIUM: Magnesium: 1.7 mg/dL (ref 1.7–2.4)

## 2022-08-30 MED ORDER — CARVEDILOL 6.25 MG PO TABS
6.2500 mg | ORAL_TABLET | Freq: Two times a day (BID) | ORAL | Status: DC
  Administered 2022-08-30 – 2022-08-31 (×3): 6.25 mg via ORAL
  Filled 2022-08-30 (×3): qty 1

## 2022-08-30 MED ORDER — MAGNESIUM OXIDE -MG SUPPLEMENT 400 (240 MG) MG PO TABS
400.0000 mg | ORAL_TABLET | Freq: Two times a day (BID) | ORAL | Status: DC
  Administered 2022-08-30 – 2022-09-02 (×7): 400 mg via ORAL
  Filled 2022-08-30 (×7): qty 1

## 2022-08-30 MED ORDER — MAGNESIUM SULFATE 2 GM/50ML IV SOLN
2.0000 g | Freq: Once | INTRAVENOUS | Status: AC
  Administered 2022-08-30: 2 g via INTRAVENOUS
  Filled 2022-08-30: qty 50

## 2022-08-30 MED ORDER — SPIRONOLACTONE 12.5 MG HALF TABLET
12.5000 mg | ORAL_TABLET | Freq: Every day | ORAL | Status: DC
  Administered 2022-08-30 – 2022-09-02 (×4): 12.5 mg via ORAL
  Filled 2022-08-30 (×4): qty 1

## 2022-08-30 MED ORDER — AMIODARONE IV BOLUS ONLY 150 MG/100ML
150.0000 mg | Freq: Once | INTRAVENOUS | Status: AC
  Administered 2022-08-31: 150 mg via INTRAVENOUS
  Filled 2022-08-30: qty 100

## 2022-08-30 MED ORDER — METOPROLOL TARTRATE 5 MG/5ML IV SOLN
2.5000 mg | INTRAVENOUS | Status: DC | PRN
  Administered 2022-08-30 – 2022-08-31 (×3): 2.5 mg via INTRAVENOUS
  Filled 2022-08-30 (×3): qty 5

## 2022-08-30 NOTE — Progress Notes (Signed)
Heart Failure Nurse Navigator Progress Note  PCP: [name], MD PCP-Cardiologist: None Admission Diagnosis: chest pain Admitted from: Home   Presentation:   Randy Myers presented with chest pain and shortness of breath x 2 weeks, EKG showed NSR with non-specific ST changes with inferior infarct. Had heart cath 10/19 showed severe three vessel CAD with total occulusion of mid RCA. Sub-occlusion of mid LAD, a CABG x 3 was performed on 08/24/22.   Met with both patient and his wife Comptroller) at bedside with education on the sign and symptoms of heart failure, daily weights, Diet/ fluid restrictions and taking ll medications as prescribed, attending all medical appointments. Patient reported that he weighs himself daily, they have made a number of dietary changes over time, he continues to exercise , takes all his medications as prescribed and attends his medical appointments. Patient and his wife were interested in coming to  his HF [facility] appointment, scheduled for 09/13/22 @ 11 am.   ECHO/ LVEF: 30-35% G1DD HFrEF  Clinical Course:  Past Medical History:  Diagnosis Date   Arthritis    Coronary artery disease    Hypertension    Migraine    Multiple vessel coronary artery disease 08/20/2022   Presented with unstable angina 08/19/2022: Cath -> ost-prox RCA 40%, prox-mid RCA 100% (~CTO w/ L-R collaterals); Ost-Prox LAD 40% prox-midLAD 99% (appears to be subtotally occluded at major SP branch.); Prox-mid LCx 70%, OM2 70%.  LVEF 25 to 35% (moderate to severely reduced function.  Severe HK of anterior wall 15 mmHg). ==> CABG, concern for PCI of the LAD could jeopardize large SP branch. => W   Prostate cancer ([place])    Prostate cancer ([place])    with radiation   Stroke ([place]) 06/10/2013   Stroke [facility])      Social History   Socioeconomic History   Marital status: Married    Spouse name: [name]   Number of children: 1   Years of education: HS   Highest education level: Not on file   Occupational History   Occupation: Retired  Tobacco Use   Smoking status: Never   Smokeless tobacco: Never  Vaping Use   Vaping Use: Never used  Substance and Sexual Activity   Alcohol use: Never   Drug use: Never   Sexual activity: Not on file  Other Topics Concern   Not on file  Social History Narrative   ** Merged History Encounter **       Patient is married with one child. Patient is left handed. Patient has high school education. Caffeine Use: 2 cups day   Social Determinants of Health   Financial Resource Strain: Low Risk  (08/30/2022)   Overall Financial Resource Strain (CARDIA)    Difficulty of Paying Living Expenses: Not hard at all  Food Insecurity: No Food Insecurity (08/20/2022)   Hunger Vital Sign    Worried About Running Out of Food in the Last Year: Never true    Ran Out of Food in the Last Year: Never true  Transportation Needs: No Transportation Needs (08/20/2022)   PRAPARE - Hydrologist (Medical): No    Lack of Transportation (Non-Medical): No  Physical Activity: Not on file  Stress: Not on file  Social Connections: Not on file   Education Assessment and Provision:  Detailed education and instructions provided on heart failure disease management including the following:  Signs and symptoms of Heart Failure When to call the physician Importance of daily  weights Low sodium diet Fluid restriction Medication management Anticipated future follow-up appointments  Patient education given on each of the above topics.  Patient acknowledges understanding via teach back method and acceptance of all instructions.  Education Materials:  "Living Better With Heart Failure" Booklet, HF zone tool, & Daily Weight Tracker Tool.  Patient has scale at home: Yes Patient has pill box at home: NA    High Risk Criteria for Readmission and/or Poor Patient Outcomes: Heart failure hospital admissions (last 6 months): 0  No Show rate:  1 Difficult social situation: No Demonstrates medication adherence: Yes Primary Language: English Literacy level: Reading, writing, and comprehension.   Barriers of Care:   Continued HF education Diet/ fluid   Considerations/Referrals:   Referral made to Heart Failure Pharmacist Stewardship: yes Referral made to Heart Failure CSW/NCM TOC: No Referral made to Heart & Vascular TOC clinic: Yes, 09/13/22 @ 11 am.   Items for Follow-up on DC/TOC: Continued HF education Diet/ fluid    Earnestine Leys, BSN, RN Heart Failure Transport planner Only

## 2022-08-30 NOTE — Progress Notes (Addendum)
eLink Physician-Brief Progress Note Patient Name: MUSTAF ANTONACCI DOB: [birth year] MRN: [id number]   Date of Service  08/30/2022  HPI/Events of Note  gone back in afib/rvr 127-152,   previous amio '150mg'$  boluses have help in past     142/106  eICU Interventions  Discussed with RN. Lytes ok. Hg 10. On oral amiodarone.   Just converted back to sinus now as we speaking.  Send AM labs and replace if low. Observe for now.       Intervention Category Intermediate Interventions: Arrhythmia - evaluation and management  Elmer Sow 08/30/2022, 3:03 AM  6:44 Camera: Sinus tachy -afib in and out. Sitting on chair. Asymptomatic. MAP good. - will leave it to CVTS team for further care.  Discussed with RN.

## 2022-08-30 NOTE — TOC Benefit Eligibility Note (Signed)
Patient Teacher, English as a foreign language completed.    The patient is currently admitted and upon discharge could be taking Eliquis 5 mg.  The current 30 day co-pay is $45.00.   The patient is insured through [employer], [place] Patient Advocate Specialist [place] Patient Advocate Team Direct Number: [phone]  Fax: [phone]

## 2022-08-30 NOTE — Telephone Encounter (Signed)
Pharmacy Patient Advocate Encounter  Insurance verification completed.    The patient is insured through [employer] Medicare Part D   The patient is currently admitted and ran test claims for the following: Eliquis .  Copays and coinsurance results were relayed to Inpatient clinical team.      

## 2022-08-30 NOTE — Discharge Instructions (Signed)
Information on my medicine - ELIQUIS (apixaban)  This medication education was reviewed with me or my healthcare representative as part of my discharge preparation.  The pharmacist that spoke with me during my hospital stay was:  Ursula Beath, [facility]  Why was Eliquis prescribed for you? Eliquis was prescribed for you to reduce the risk of forming blood clots that can cause a stroke if you have a medical condition called atrial fibrillation (a type of irregular heartbeat) OR to reduce the risk of a blood clots forming after orthopedic surgery.  What do You need to know about Eliquis ? Take your Eliquis TWICE DAILY - one tablet in the morning and one tablet in the evening with or without food.  It would be best to take the doses about the same time each day.  If you have difficulty swallowing the tablet whole please discuss with your pharmacist how to take the medication safely.  Take Eliquis exactly as prescribed by your doctor and DO NOT stop taking Eliquis without talking to the doctor who prescribed the medication.  Stopping may increase your risk of developing a new clot or stroke.  Refill your prescription before you run out.  After discharge, you should have regular check-up appointments with your healthcare provider that is prescribing your Eliquis.  In the future your dose may need to be changed if your kidney function or weight changes by a significant amount or as you get older.  What do you do if you miss a dose? If you miss a dose, take it as soon as you remember on the same day and resume taking twice daily.  Do not take more than one dose of ELIQUIS at the same time.  Important Safety Information A possible side effect of Eliquis is bleeding. You should call your healthcare provider right away if you experience any of the following: Bleeding from an injury or your nose that does not stop. Unusual colored urine (red or dark brown) or unusual colored stools (red or  black). Unusual bruising for unknown reasons. A serious fall or if you hit your head (even if there is no bleeding).  Some medicines may interact with Eliquis and might increase your risk of bleeding or clotting while on Eliquis. To help avoid this, consult your healthcare provider or pharmacist prior to using any new prescription or non-prescription medications, including herbals, vitamins, non-steroidal anti-inflammatory drugs (NSAIDs) and supplements.  This website has more information on Eliquis (apixaban): http://www.eliquis.com/eliquis/home

## 2022-08-30 NOTE — Progress Notes (Signed)
   NAME:  Randy Myers, MRN:  [id number], DOB:  12-04-1945, LOS: 80 ADMISSION DATE:  08/19/2022, CONSULTATION DATE:  08/29/22 REFERRING MD:  Kipp Brood, CHIEF COMPLAINT:  chest pain   History of Present Illness:  76 year old man with PMH below presenting with anginal symptoms found to have ischemic cardiomyopathy and severe multivessel CAD.  He undewent CABG x 3 (LIMA-LAD, RSVG PDA/OM) on 08/24/22.  Postoperative course complicated by persistent afib that has been difficult to control.  PCCM consulted for postop management.   Currently no complaints, has some DOE.  Able to ambulate.  Pain under control.  Pertinent  Medical History  Arthritis  CAD  Multi-vessel CAD  HTN  CVA Prostate Cancer s/p Radiation   Significant Hospital Events: Including procedures, antibiotic start and stop dates in addition to other pertinent events   10/19 Admit  10/24 CABG X 3, LIMA LAD, RSVG PDA, OM. Endoscopic greater saphenous vein harvest on the right. Xclamp 8mn 10/30 Up to chair, remains in [place] BM x1   Interim History / Subjective:  BM x1 per patient, reports first time since surgery  AFwRVR 140's, asymptomatic  Afebrile  I/O 2L UOP, -4L in last 24 hours   Objective   Blood pressure 131/69, pulse 92, temperature 98.4 F (36.9 C), temperature source Axillary, resp. rate (!) 27, height '5\' 11"'$  (1.803 m), weight 88.7 kg, SpO2 (!) 89 %.        Intake/Output Summary (Last 24 hours) at 08/30/2022 0750 Last data filed at 08/30/2022 0500 Gross per 24 hour  Intake 600 ml  Output 2025 ml  Net -1425 ml   Filed Weights   08/27/22 0500 08/28/22 0500 08/29/22 0500  Weight: 91.8 kg 88.5 kg 88.7 kg    Examination: General: elderly male up in chair in NAD  HEENT: MM pink/moist, anicteric  Neuro: AAOx4, speech clear, MAE CV: s1s2 irr irr, AF in 130-140's on monitor, no m/r/g PULM: non-labored at rest, lungs bilaterally distant but clear  GI: soft, bsx4 active  Extremities: warm/dry, trace LE  edema  Skin: sternotomy incision clean/dry   Resolved Hospital Problem list   N/A  Assessment & Plan:   Ischemic cardiomyopathy, multivessel CAD s/p 3 vessel CABG 08/24/22 Postop anemia, thrombocytopenia stable Postop AF, persistent Postop volume overload Hypokalemia, Hypomagnesemia  Hx prostate cancer Hx CVA  -s/p amiodarone bolus overnight, continue amio PO -coreg increased per TCTS -PRN lopresor low dose for AF >150 -eliquis per pharmacy  -mobilize, IS -lasix 40 mg IV BID  -low dose spironolactone  -replace K/Mg+  Best Practice (right click and "Reselect all SmartList Selections" daily)  Per primary      BNoe Gens MSN, APRN, NP-C, AGACNP-BC [facility] 08/30/2022, 7:51 AM   Please see [email or web address] for pager details.   From 7A-7P if no response, please call [phone] After hours, please call ELink [phone]

## 2022-08-30 NOTE — Progress Notes (Addendum)
Pt walked this morning, pt request that I come back in the afternoon. Pt understands and has no questions about sternal precautions. Encouraged IS use 10x/hr.

## 2022-08-30 NOTE — Progress Notes (Signed)
   Heart Failure Stewardship Pharmacist Progress Note   PCP: [name], MD PCP-Cardiologist: None    HPI:  76 YO male with PMH significant for CVA, prostate cancer, CABG x3, and HFrEF. He reported chest pain and SOB, n/v after walking short distances to his PCP on 08/11/2022. On 10/12, he saw cardiology and reported chest pain and SOB progressing over 2 weeks. EKG at that time showed NSR with non-specific ST changes with inferior infarct. He presented on 10/19 for elective cardiac catheterization. Procedure showed severe three vessel CAD with total occulusion of the mid RCA, sub-total occulusion of the mid LAD, and severe segmental LV dysfunction. ECHO on 10/19 showed EF 30-35% with moderate to severe LV dysfunction and grade I diastolic dysfunction. CABG X3 on 08/24/22. Postop complicated by persistent afib.  Current HF Medications: Diuretic: furosemide 40 mg PO BID Beta Blocker: carvedilol 6.25 mg BID  MRA: spironolactone 12.5 mg daily  Prior to admission HF Medications: None  Pertinent Lab Values: Serum creatinine 1.05, BUN 13, Potassium 3.8, Sodium 138, Magnesium 1.7, A1c 5.4  Vital Signs: Weight: 195 lbs (admission weight: 195 lbs) Blood pressure: 110-130/70s Heart rate: 70-120s  I/O: -1.4L yesterday; net -4.2L  Medication Assistance / Insurance Benefits Check: Does the patient have prescription insurance?  Yes Type of insurance plan: [employer] Medicare  Does the patient qualify for medication assistance through manufacturers or grants?   Pending Eligible grants and/or patient assistance programs: pending Medication assistance applications in progress: none  Medication assistance applications approved: none Approved medication assistance renewals will be completed by: pending  Outpatient Pharmacy:  Prior to admission outpatient pharmacy: [place] Is the patient willing to use [place] pharmacy at discharge? Pending Is the patient willing to transition their  outpatient pharmacy to utilize a [facility] outpatient pharmacy?   Pending    Assessment: 1. Acute on chronic systolic CHF (LVEF 56-43%), due to ICM. NYHA class III symptoms. - Continue furosemide 40 mg PO daily. Strict I/Os and daily weights. Keep K>4 and Mag>2. Magnesium 2g IV x1 given for replacement. KCl 20 mEq BID scheduled since last night, also starting MRA. Recheck BMET tomorrow. - Agree with increasing to carvedilol 6.25 mg BID - Consider adding Entresto 24/26 mg BID prior to discharge for GDMT optimization - Agree with adding spironolactone 12.5 mg daily - Consider adding Jardinace 10 mg daily tomorrow if SCr stable    Plan: 1) Medication changes recommended at this time: -Start Jardiance 10 mg once daily tomorrow if SCr stable  2) Patient assistance: -Copays: Delene Loll and Jardiance $45 and Farxiga $95  3)  Education  - Initial education completed - Full education to be completed prior to discharge.  Kerby Nora, PharmD, BCPS Heart Failure Stewardship Pharmacist Phone [phone]

## 2022-08-30 NOTE — Progress Notes (Addendum)
TCTS DAILY ICU PROGRESS NOTE                   [address] [place]            [place] [id number]          [phone]   6 Days Post-Op Procedure(s) (LRB): CORONARY ARTERY BYPASS GRAFTING (CABG) x3, USING LEFT INTERNAL MAMMARY ARTERY AND ENDOSCOPICALLY HARVESTED RIGHT GREATER SAPHENOUS VEIN (N/A) TRANSESOPHAGEAL ECHOCARDIOGRAM (TEE) (N/A)  Total Length of Stay:  LOS: 11 days   Subjective: Feels well overall, feels a little anxious at times, no palpitations  Objective: Vital signs in last 24 hours: Temp:  [97.7 F (36.5 C)-98.5 F (36.9 C)] 98.4 F (36.9 C) (10/30 0700) Pulse Rate:  [61-135] 86 (10/30 0500) Cardiac Rhythm: Normal sinus rhythm (10/29 2000) Resp:  [13-27] 15 (10/30 0500) BP: (85-152)/(54-106) 131/96 (10/30 0500) SpO2:  [90 %-99 %] 93 % (10/30 0500)  Filed Weights   08/27/22 0500 08/28/22 0500 08/29/22 0500  Weight: 91.8 kg 88.5 kg 88.7 kg    Weight change:    Hemodynamic parameters for last 24 hours:    Intake/Output from previous day: 10/29 0701 - 10/30 0700 In: 600 [P.O.:600] Out: 2025 [Urine:2025]  Intake/Output this shift: No intake/output data recorded.  Current Meds: Scheduled Meds:  amiodarone  400 mg Oral BID   apixaban  5 mg Oral BID   atorvastatin  80 mg Oral Daily   bisacodyl  10 mg Oral Daily   Or   bisacodyl  10 mg Rectal Daily   carvedilol  3.125 mg Oral BID WC   Chlorhexidine Gluconate Cloth  6 each Topical Daily   docusate sodium  200 mg Oral Daily   furosemide  40 mg Oral BID   insulin aspart  0-24 Units Subcutaneous TID AC & HS   pantoprazole  40 mg Oral Daily   potassium chloride  20 mEq Oral BID   sodium chloride flush  3 mL Intravenous Q12H   sodium chloride flush  3 mL Intravenous Q12H   Continuous Infusions:  sodium chloride     lactated ringers     PRN Meds:.sodium chloride, docusate sodium, ondansetron (ZOFRAN) IV, mouth rinse, oxyCODONE, polyethylene glycol, sodium chloride flush, sodium chloride  flush, traMADol  General appearance: alert, cooperative, and no distress Heart: irregularly irregular rhythm Lungs: clear to auscultation bilaterally Abdomen: Benign Extremities: Minor ankle edema Wound: Incisions healing well without evidence of infection  Lab Results: CBC: Recent Labs    08/29/22 0419 08/30/22 0558  WBC 5.7 6.1  HGB 10.3* 10.7*  HCT 29.6* 31.8*  PLT 138* 181   BMET:  Recent Labs    08/29/22 0419 08/30/22 0558  NA 139 138  K 3.8 3.8  CL 105 104  CO2 27 26  GLUCOSE 116* 133*  BUN 12 13  CREATININE 0.91 1.05  CALCIUM 8.4* 8.5*    CMET: Lab Results  Component Value Date   WBC 6.1 08/30/2022   HGB 10.7 (L) 08/30/2022   HCT 31.8 (L) 08/30/2022   PLT 181 08/30/2022   GLUCOSE 133 (H) 08/30/2022   CHOL 132 08/20/2022   TRIG 62 08/20/2022   HDL 44 08/20/2022   LDLCALC 76 08/20/2022   ALT 16 09/14/2019   AST 39 09/14/2019   NA 138 08/30/2022   K 3.8 08/30/2022   CL 104 08/30/2022   CREATININE 1.05 08/30/2022   BUN 13 08/30/2022   CO2 26 08/30/2022   INR 1.5 (H) 08/24/2022  HGBA1C 5.4 08/20/2022      PT/INR: No results for input(s): "LABPROT", "INR" in the last 72 hours. Radiology: No results found.   Assessment/Plan: S/P Procedure(s) (LRB): CORONARY ARTERY BYPASS GRAFTING (CABG) x3, USING LEFT INTERNAL MAMMARY ARTERY AND ENDOSCOPICALLY HARVESTED RIGHT GREATER SAPHENOUS VEIN (N/A) TRANSESOPHAGEAL ECHOCARDIOGRAM (TEE) (N/A)  POD#6 1 afebrile, SBP 80s to 150s, A-fib with RVR overnight-on oral amiodarone-we will give 150 mg IV bolus and increase Coreg, he is on Eliquis 2 O2 sats are good on room air 3 good urine output, not weighed yet, currently on twice daily Lasix 4 CBGs mostly controlled well, 1 reading greater than 200 yesterday 5 normal renal function, magnesium 1.7, will replace to gain greater than 2.0 with atrial fibrillation, potassium 3.8-receiving supplement with Lasix dosing 6 expected acute blood loss anemia, minor, very  stable with H/H 10.7/31.8 7 continue routine cardiac rehab and pulmonary hygiene   John Giovanni PA-C Pager [phone] 08/30/2022 7:11 AM  Agree with above Intermittent afib with RVR.  In sinus now Increase BB, remains on PO amio Will watch in unit one more day  Stryker Veasey O Lutisha Knoche

## 2022-08-31 DIAGNOSIS — I4891 Unspecified atrial fibrillation: Secondary | ICD-10-CM | POA: Diagnosis not present

## 2022-08-31 DIAGNOSIS — Z951 Presence of aortocoronary bypass graft: Secondary | ICD-10-CM | POA: Diagnosis not present

## 2022-08-31 DIAGNOSIS — I480 Paroxysmal atrial fibrillation: Secondary | ICD-10-CM | POA: Diagnosis not present

## 2022-08-31 DIAGNOSIS — I2510 Atherosclerotic heart disease of native coronary artery without angina pectoris: Secondary | ICD-10-CM | POA: Diagnosis not present

## 2022-08-31 DIAGNOSIS — I200 Unstable angina: Secondary | ICD-10-CM | POA: Diagnosis not present

## 2022-08-31 LAB — BASIC METABOLIC PANEL
Anion gap: 14 (ref 5–15)
BUN: 16 mg/dL (ref 8–23)
CO2: 24 mmol/L (ref 22–32)
Calcium: 9 mg/dL (ref 8.9–10.3)
Chloride: 103 mmol/L (ref 98–111)
Creatinine, Ser: 1.08 mg/dL (ref 0.61–1.24)
GFR, Estimated: >60 mL/min (ref >60)
Glucose, Bld: 134 mg/dL — ABNORMAL HIGH (ref 70–99)
Potassium: 4.2 mmol/L (ref 3.5–5.1)
Sodium: 141 mmol/L (ref 135–145)

## 2022-08-31 LAB — GLUCOSE, CAPILLARY
Glucose-Capillary: 152 mg/dL — ABNORMAL HIGH (ref 70–99)
Glucose-Capillary: 101 mg/dL — ABNORMAL HIGH (ref 70–99)
Glucose-Capillary: 106 mg/dL — ABNORMAL HIGH (ref 70–99)
Glucose-Capillary: 140 mg/dL — ABNORMAL HIGH (ref 70–99)

## 2022-08-31 LAB — CBC
HCT: 33.4 % — ABNORMAL LOW (ref 39.0–52.0)
Hemoglobin: 11.3 g/dL — ABNORMAL LOW (ref 13.0–17.0)
MCH: 30.9 pg (ref 26.0–34.0)
MCHC: 33.8 g/dL (ref 30.0–36.0)
MCV: 91.3 fL (ref 80.0–100.0)
Platelets: 235 10*3/uL (ref 150–400)
RBC: 3.66 MIL/uL — ABNORMAL LOW (ref 4.22–5.81)
RDW: 13.8 % (ref 11.5–15.5)
WBC: 6.8 10*3/uL (ref 4.0–10.5)
nRBC: 0 % (ref 0.0–0.2)

## 2022-08-31 LAB — MAGNESIUM: Magnesium: 2.1 mg/dL (ref 1.7–2.4)

## 2022-08-31 MED ORDER — ASPIRIN 81 MG PO TBEC
81.0000 mg | DELAYED_RELEASE_TABLET | Freq: Every day | ORAL | Status: DC
  Administered 2022-08-31 – 2022-09-02 (×3): 81 mg via ORAL
  Filled 2022-08-31 (×3): qty 1

## 2022-08-31 MED ORDER — METOPROLOL TARTRATE 25 MG PO TABS
25.0000 mg | ORAL_TABLET | Freq: Four times a day (QID) | ORAL | Status: DC
  Administered 2022-08-31 – 2022-09-01 (×2): 25 mg via ORAL
  Filled 2022-08-31 (×2): qty 1

## 2022-08-31 MED ORDER — METOPROLOL TARTRATE 5 MG/5ML IV SOLN: 2.5000 mg | INTRAVENOUS | Status: DC | PRN

## 2022-08-31 NOTE — Progress Notes (Signed)
   Heart Failure Stewardship Pharmacist Progress Note   PCP: [name], MD PCP-Cardiologist: None    HPI:  76 YO male with PMH significant for CVA, prostate cancer, CABG x3, and HFrEF. He reported chest pain and SOB, n/v after walking short distances to his PCP on 08/11/2022. On 10/12, he saw cardiology and reported chest pain and SOB progressing over 2 weeks. EKG at that time showed NSR with non-specific ST changes with inferior infarct. He presented on 10/19 for elective cardiac catheterization. Procedure showed severe three vessel CAD with total occulusion of the mid RCA, sub-total occulusion of the mid LAD, and severe segmental LV dysfunction. ECHO on 10/19 showed EF 30-35% with moderate to severe LV dysfunction and grade I diastolic dysfunction. CABG X3 on 08/24/22. Postop complicated by persistent afib.  Current HF Medications: Beta Blocker: carvedilol 6.25 mg BID  MRA: spironolactone 12.5 mg daily  Prior to admission HF Medications: None  Pertinent Lab Values: Serum creatinine 1.08, BUN 16, Potassium 4.2, Sodium 141, Magnesium 2.1, A1c 5.4  Vital Signs: Weight: 188 lbs (admission weight: 195 lbs) Blood pressure: 100/70s Heart rate: 110s  I/O: -1.5L yesterday; net -4.7L  Medication Assistance / Insurance Benefits Check: Does the patient have prescription insurance?  Yes Type of insurance plan: [employer] Medicare  Does the patient qualify for medication assistance through manufacturers or grants?   Pending Eligible grants and/or patient assistance programs: pending Medication assistance applications in progress: none  Medication assistance applications approved: none Approved medication assistance renewals will be completed by: pending  Outpatient Pharmacy:  Prior to admission outpatient pharmacy: [place] Is the patient willing to use [place] pharmacy at discharge? Pending Is the patient willing to transition their outpatient pharmacy to utilize a [facility]  outpatient pharmacy?   Pending    Assessment: 1. Acute on chronic systolic CHF (LVEF 91-63%), due to ICM. NYHA class III symptoms. - Agree with holding furosemide and stopping K supplement. Strict I/Os and daily weights. Keep K>4 and Mag>2.  - Continue carvedilol 6.25 mg BID - BP too soft for adding Entresto, may be able to add low dose ARB - Continue spironolactone 12.5 mg daily - Consider adding Jardinace 10 mg daily to optimize GDMT in the setting of low BP   Plan: 1) Medication changes recommended at this time: -Start Jardiance 10 mg once daily  2) Patient assistance: -Copays: Delene Loll and Jardiance $45 and Farxiga $95  3)  Education  - Initial education completed - Full education to be completed prior to discharge.  Kerby Nora, PharmD, BCPS Heart Failure Stewardship Pharmacist Phone [phone]

## 2022-08-31 NOTE — Progress Notes (Signed)
   NAME:  Randy Myers, MRN:  [id number], DOB:  October 02, 1946, LOS: 12 ADMISSION DATE:  08/19/2022, CONSULTATION DATE:  08/29/22 REFERRING MD:  Kipp Brood, CHIEF COMPLAINT:  chest pain   History of Present Illness:  76 year old man with PMH below presenting with anginal symptoms found to have ischemic cardiomyopathy and severe multivessel CAD.  He undewent CABG x 3 (LIMA-LAD, RSVG PDA/OM) on 08/24/22.  Postoperative course complicated by persistent afib that has been difficult to control.  PCCM consulted for postop management.   Currently no complaints, has some DOE.  Able to ambulate.  Pain under control.  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 Endoscopic greater saphenous vein harvest on the right. Xclamp 78mn 10/30 [facility], BM x1  10/31 Ongoing PAF with RVR   Interim History / Subjective:  RN reports intermittent episodes of AFwRVR, pt to stay in ICU  Afebrile  BM again this am  Lasix stopped per TCTS  Pt denies pain, SOB On RA  I/O - 850 ml UOP, -360 ml in last 24 hours  Objective   Blood pressure 106/77, pulse (!) 113, temperature 98.4 F (36.9 C), temperature source Oral, resp. rate (!) 25, height '5\' 11"'$  (1.803 m), weight 85.6 kg, SpO2 91 %.        Intake/Output Summary (Last 24 hours) at 08/31/2022 0916 Last data filed at 08/31/2022 0800 Gross per 24 hour  Intake 370 ml  Output 900 ml  Net -530 ml   Filed Weights   08/29/22 0500 08/30/22 0500 08/31/22 0500  Weight: 88.7 kg 88 kg 85.6 kg    Examination: General: adult male sitting up in bed in NAD, wife at bedside HEENT: MM pink/moist, anicteric, pupils =/reactive   Neuro: AAOx4, speech clear, MAE  CV: s1s2 RRR, no m/r/g PULM: non-labored at rest, lungs bilaterally clear  GI: soft, bsx4 active  Extremities: warm/dry, trace BLE edema  Skin: sternotomy wound c/d/i, RLE harvest sites c/d/i  Resolved Hospital Problem list   N/A  Assessment & Plan:   Ischemic cardiomyopathy, multivessel CAD s/p 3 vessel CABG 08/24/22 Acute on Chronic Systolic CHF, LVEF 344-81%Postop AF, persistent Postop volume overload Postop anemia, thrombocytopenia stable HTN, HLD  Hypokalemia, Hypomagnesemia  Hx prostate cancer Hx CVA Chronic Left Rib Fractures  -amiodarone '400mg'$  PO BID  -continue eliquis, ASA, lipitor, coreg  -PRN lopressor for AF >150 -mobilize / PT efforts  -pulmonary hygiene -IS / OOB  -spironolactone  -ensure K+>4, Mg+ >2 -Cardiology consult per [facility] pending   Best Practice (right click and "Reselect all SmartList Selections" daily)  Per primary      BNoe Gens MSN, APRN, NP-C, AGACNP-BC [facility] 08/31/2022, 9:16 AM   Please see [email or web address] for pager details.   From 7A-7P if no response, please call [phone] After hours, please call ELink [phone]

## 2022-08-31 NOTE — Consult Note (Addendum)
ELECTROPHYSIOLOGY CONSULT NOTE    Patient ID: Randy Myers MRN: [id number], DOB/AGE: 1946/07/08 76 y.o.  Admit date: 08/19/2022 Date of Consult: 08/31/2022  Primary Physician: [name], MD Primary Cardiologist: None  Electrophysiologist: none   Referring Provider: Dr. Kipp Brood  Patient Profile: Randy Myers is a 76 y.o. male with a history of arthritis, CAD, HTN, CVA (residual r-side deficits), Prostate Ca,  HFrEF, s/p CABG x 3 with post-op afib who is being seen today for the evaluation of afib at the request of Dr. Kipp Brood.  HPI:  Randy Myers is a 76 y.o. male who presented to PCP's office c/o anginal symptoms, found to have ischemic cardiomyopathy and severe multivessel CAD. Underwent CABG x 3 (LIMA-LAD, RSVG PDA/OM) on 08/24/22. Postoperative course complicated by intermittent afib w RVR that has been difficult to control.  Potassium4.2 (10/31 0537) Magnesium  2.1 (10/31 0537) Creatinine, ser  1.08 (10/31 0537) PLT  235 (10/31 0537) HGB  11.3* (10/31 0537) WBC 6.8 (10/31 0537)  .   Has received a little over 5g amiodarone (IV and PO), currently on 400 BID Also on 6.25 coreg BID  AC with eliquis, appropriately dosed  He feels well currently. Walking around unit without SOB, very mild palpitations/anxious feeling. No dizziness or lightheadedness.    Past Medical History:  Diagnosis Date   Arthritis    Coronary artery disease    Hypertension    Migraine    Multiple vessel coronary artery disease 08/20/2022   Presented with unstable angina 08/19/2022: Cath -> ost-prox RCA 40%, prox-mid RCA 100% (~CTO w/ L-R collaterals); Ost-Prox LAD 40% prox-midLAD 99% (appears to be subtotally occluded at major SP branch.); Prox-mid LCx 70%, OM2 70%.  LVEF 25 to 35% (moderate to severely reduced function.  Severe HK of anterior wall 15 mmHg). ==> CABG, concern for PCI of the LAD could jeopardize large SP branch. => W   Prostate cancer ([place])    Prostate cancer ([place])     with radiation   Stroke ([place]) 06/10/2013   Stroke [facility])      Surgical History:  Past Surgical History:  Procedure Laterality Date   BACK SURGERY  1983   BACK SURGERY     CORONARY ARTERY BYPASS GRAFT N/A 08/24/2022   Procedure: CORONARY ARTERY BYPASS GRAFTING (CABG) x3, USING LEFT INTERNAL MAMMARY ARTERY AND ENDOSCOPICALLY HARVESTED RIGHT GREATER SAPHENOUS VEIN;  Surgeon: Lajuana Matte, MD;  Location: [facility];  Service: Open Heart Surgery;  Laterality: N/A;   EYE SURGERY     LEFT HEART CATH AND CORONARY ANGIOGRAPHY N/A 08/19/2022   Procedure: LEFT HEART CATH AND CORONARY ANGIOGRAPHY;  Surgeon: Burnell Blanks, MD;  Location: [facility];  Service: Cardiovascular;  Laterality: N/A;   PROSTATE BIOPSY     TEE WITHOUT CARDIOVERSION N/A 08/24/2022   Procedure: TRANSESOPHAGEAL ECHOCARDIOGRAM (TEE);  Surgeon: Lajuana Matte, MD;  Location: [place];  Service: Open Heart Surgery;  Laterality: N/A;     Medications Prior to Admission  Medication Sig Dispense Refill Last Dose   acetaminophen (TYLENOL) 650 MG CR tablet Take 650 mg by mouth every 8 (eight) hours as needed for pain.   08/18/2022   atorvastatin (LIPITOR) 10 MG tablet Take 10 mg by mouth daily after supper.   08/19/2022   clopidogrel (PLAVIX) 75 MG tablet Take 75 mg by mouth daily after supper.   08/19/2022 at 0430   famotidine (PEPCID) 40 MG tablet Take 40 mg by mouth daily after supper.  08/18/2022   lisinopril (ZESTRIL) 5 MG tablet Take 5 mg by mouth daily after supper.   08/19/2022   Multiple Vitamin (MULTIVITAMIN WITH MINERALS) TABS tablet Take 1 tablet by mouth daily with breakfast.   08/18/2022   omeprazole (PRILOSEC) 40 MG capsule Take 40 mg by mouth daily with breakfast.    08/18/2022    Inpatient Medications:   amiodarone  400 mg Oral BID   apixaban  5 mg Oral BID   aspirin EC  81 mg Oral Daily   atorvastatin  80 mg Oral Daily   bisacodyl  10 mg Oral Daily   Or   bisacodyl  10 mg Rectal Daily    carvedilol  6.25 mg Oral BID WC   Chlorhexidine Gluconate Cloth  6 each Topical Daily   docusate sodium  200 mg Oral Daily   insulin aspart  0-24 Units Subcutaneous TID AC & HS   magnesium oxide  400 mg Oral BID   pantoprazole  40 mg Oral Daily   sodium chloride flush  3 mL Intravenous Q12H   spironolactone  12.5 mg Oral Daily    Allergies: No Known Allergies  Social History   Socioeconomic History   Marital status: Married    Spouse name: [name]   Number of children: 1   Years of education: HS   Highest education level: Not on file  Occupational History   Occupation: Retired  Tobacco Use   Smoking status: Never   Smokeless tobacco: Never  Vaping Use   Vaping Use: Never used  Substance and Sexual Activity   Alcohol use: Never   Drug use: Never   Sexual activity: Not on file  Other Topics Concern   Not on file  Social History Narrative   ** Merged History Encounter **       Patient is married with one child. Patient is left handed. Patient has high school education. Caffeine Use: 2 cups day   Social Determinants of Health   Financial Resource Strain: Low Risk  (08/30/2022)   Overall Financial Resource Strain (CARDIA)    Difficulty of Paying Living Expenses: Not hard at all  Food Insecurity: No Food Insecurity (08/20/2022)   Hunger Vital Sign    Worried About Running Out of Food in the Last Year: Never true    Ran Out of Food in the Last Year: Never true  Transportation Needs: No Transportation Needs (08/20/2022)   PRAPARE - Hydrologist (Medical): No    Lack of Transportation (Non-Medical): No  Physical Activity: Not on file  Stress: Not on file  Social Connections: Not on file  Intimate Partner Violence: Not At Risk (08/20/2022)   Humiliation, Afraid, Rape, and Kick questionnaire    Fear of Current or Ex-Partner: No    Emotionally Abused: No    Physically Abused: No    Sexually Abused: No     Family History  Problem  Relation Age of Onset   CAD Father    Heart disease Father    Brain cancer Sister    Cancer Sister        brain     Review of Systems: All other systems reviewed and are otherwise negative except as noted above.  Physical Exam: Vitals:   08/31/22 0700 08/31/22 0800 08/31/22 0900 08/31/22 0924  BP: 90/70 100/62 106/77   Pulse: 100 (!) 113 (!) 113   Resp: 20 (!) 21 (!) 25   Temp:    98 F (36.7  C)  TempSrc:    Oral  SpO2: 91% 93% 91%   Weight:      Height:        GEN- The patient is well appearing, alert and oriented x 3 today, sitting upright in bed HEENT: normocephalic, atraumatic; sclera clear, conjunctiva pink; hearing intact; oropharynx clear; neck supple Lungs- Clear to ausculation bilaterally, normal work of breathing.  No wheezes, rales, rhonchi Heart- irregular rate and rhythm, tachycardic, no murmurs, rubs or gallops GI- soft, non-tender, non-distended, bowel sounds present Extremities- no clubbing, cyanosis, or edema; DP/PT/radial pulses 2+ bilaterally MS- no significant deformity or atrophy Skin- warm and dry, no rash or lesion, sternal chest incision with dermabond in place Psych- euthymic mood, full affect, very pleasant Neuro- strength and sensation are intact  Labs:   Lab Results  Component Value Date   WBC 6.8 08/31/2022   HGB 11.3 (L) 08/31/2022   HCT 33.4 (L) 08/31/2022   MCV 91.3 08/31/2022   PLT 235 08/31/2022    Recent Labs  Lab 08/31/22 0537  NA 141  K 4.2  CL 103  CO2 24  BUN 16  CREATININE 1.08  CALCIUM 9.0  GLUCOSE 134*      Radiology/Studies: DG CHEST PORT 1 VIEW  Result Date: 08/28/2022 CLINICAL DATA:  Chest tube removal EXAM: PORTABLE CHEST 1 VIEW COMPARISON:  August 26, 2022 FINDINGS: Left-sided rib fractures remain. No pneumothorax. Minimal atelectasis in the left base. No overt edema. No other infiltrate, nodule, or mass. The cardiomediastinal silhouette is stable. Sternotomy wires are intact. IMPRESSION: Left-sided rib  fractures remain. No pneumothorax. No other acute abnormalities. Electronically Signed   By: Dorise Bullion III M.D.   On: 08/28/2022 08:35   DG Chest Port 1 View  Result Date: 08/26/2022 CLINICAL DATA:  Chest tube in place. EXAM: PORTABLE CHEST 1 VIEW COMPARISON:  08/25/2022 FINDINGS: Sternotomy wires overlie normal cardiac silhouette. Central venous line with tip in distal SVC. No effusion, infiltrate pneumothorax. Low lung volumes. IMPRESSION: Low lung volumes.  No edema or infiltrate. Electronically Signed   By: Suzy Bouchard M.D.   On: 08/26/2022 09:12   ECHO INTRAOPERATIVE TEE  Result Date: 08/25/2022  *INTRAOPERATIVE TRANSESOPHAGEAL REPORT *  Patient Name:   Randy Myers Date of Exam: 08/24/2022 Medical Rec #:  [id number]       Height:       71.0 in Accession #:    [id number]      Weight:       190.8 lb Date of Birth:  07/20/1946        BSA:          2.07 m Patient Age:    19 years        BP:           112/74 mmHg Patient Gender: M               HR:           55 bpm. Exam Location:  Anesthesiology Transesophogeal exam was perform intraoperatively during surgical procedure. Patient was closely monitored under general anesthesia during the entirety of examination. Indications:     CAD Native Vessel i25.10 Performing Phys: [id number] Lucile Crater LIGHTFOOT Diagnosing Phys: Belinda Block MD POST-OP IMPRESSIONS _ Left Ventricle: Post Bypass: The patient came off bypass on the initial attempt on noted OR pressors. There did not appear to be any new findings form preop exam (Dr. Marcie Bal performed Post Bypass inital exam). Left ventricular contraction did improve with volume  replacement. The TEE that had been placed after induction uneventfully was removed at the end of the case without difficulty. The patient was later taken to the SICU in stable conditon. PRE-OP FINDINGS  Left Ventricle: The left ventricle has moderate-severely reduced systolic function, with an ejection fraction of 30-35%. Left Atrium:  No left atrial/left atrial appendage thrombus was detected. Mitral Valve: The mitral valve is normal in structure. Mitral valve regurgitation is trivial by color flow Doppler. There is moderate calcification present. Tricuspid Valve: The tricuspid valve was normal in structure. Aortic Valve: The aortic valve is tricuspid Aortic valve regurgitation was not visualized by color flow Doppler. There is moderate calcification present.  Belinda Block MD Electronically signed by Belinda Block MD Signature Date/Time: 08/25/2022/10:38:33 AM   Final    DG Chest Port 1 View  Result Date: 08/25/2022 CLINICAL DATA:  Provided history: Chest tube present status post CABG x3, sore chest. EXAM: PORTABLE CHEST 1 VIEW COMPARISON:  Prior chest radiographs 08/24/2022 and earlier. FINDINGS: Right IJ approach introducer sheath with Swan-Ganz catheter terminating at the level of the lower SVC. Redemonstrated chest tubes/mediastinal drains. Previously demonstrated ET and enteric tubes no longer visualized. Prior median sternotomy/CABG. Cardiomegaly, unchanged. Mild atelectasis within the medial left lung base. No appreciable airspace consolidation on the right. No evidence of pleural effusion or pneumothorax. Chronic left-sided rib fracture deformities. IMPRESSION: Support apparatus, as described. Cardiomegaly. Mild atelectasis within the left lung base, new from the prior examination of 08/24/2022. Electronically Signed   By: Kellie Simmering D.O.   On: 08/25/2022 08:34   DG Chest Port 1 View  Result Date: 08/24/2022 CLINICAL DATA:  Status post coronary artery bypass surgery EXAM: PORTABLE CHEST 1 VIEW COMPARISON:  Previous studies including the examination of 08/23/2022 FINDINGS: Transverse diameter of heart is slightly increased. There is evidence of interval coronary bypass surgery. Tip of endotracheal tube is 4.3 cm above the carina. Tip of enteric tube is seen in the stomach. Tip of right IJ central venous catheter is seen in  superior vena cava. There is possible mediastinal drain. Deformities are seen in multiple left ribs, possibly residual from previous injury. There is no pleural effusion or pneumothorax. Small linear densities are seen in left lower lung fields suggesting subsegmental atelectasis. There is no focal consolidation. There is no significant pleural effusion or pneumothorax. IMPRESSION: Subsegmental atelectasis is seen in left lower lung field. There are no signs of pulmonary edema or focal pulmonary consolidation. Status post coronary artery bypass surgery. Support devices as described in the body of the report. Electronically Signed   By: Elmer Picker M.D.   On: 08/24/2022 18:42   DG Chest 2 View  Result Date: 08/23/2022 CLINICAL DATA:  Pre-op chest examination. EXAM: CHEST - 2 VIEW COMPARISON:  Chest radiograph 12/13/2018 FINDINGS: Old left rib fractures. Heart size is stable and within normal limits. The ascending thoracic aorta appears to be prominent and cannot exclude dilatation. No focal lung disease or pulmonary edema. No large pleural effusions. Degenerative changes in the thoracic spine. IMPRESSION: 1. No acute cardiopulmonary disease. 2. Prominence of the ascending thoracic aorta. Cannot exclude dilatation. Consider further characterization with chest CT. Electronically Signed   By: Markus Daft M.D.   On: 08/23/2022 15:55   VAS US DOPPLER PRE CABG  Result Date: 08/20/2022 PREOPERATIVE VASCULAR EVALUATION Patient Name:  AADIN GAUT Velador  Date of Exam:   08/20/2022 Medical Rec #: [id number]        Accession #:    [id number] Date  of Birth: 1946-02-08         Patient Gender: M Patient Age:   22 years Exam Location:  [facility] Procedure:      VAS US DOPPLER PRE CABG Referring Phys: HARRELL LIGHTFOOT --------------------------------------------------------------------------------  Indications:      Pre-CABG. Risk Factors:     Hypertension, no history of smoking, coronary artery disease.  Comparison Study: No prior study Performing Technologist: Maudry Mayhew MHA, RVT, RDCS, RDMS  Examination Guidelines: A complete evaluation includes B-mode imaging, spectral Doppler, color Doppler, and power Doppler as needed of all accessible portions of each vessel. Bilateral testing is considered an integral part of a complete examination. Limited examinations for reoccurring indications may be performed as noted.  Right Carotid Findings: +----------+--------+-------+--------+----------------------+------------------+           PSV cm/sEDV    StenosisDescribe              Comments                             cm/s                                                    +----------+--------+-------+--------+----------------------+------------------+ CCA Prox  74      12                                                      +----------+--------+-------+--------+----------------------+------------------+ CCA Distal55      21                                   intimal thickening +----------+--------+-------+--------+----------------------+------------------+ ICA Prox  31      11             smooth and                                                                heterogenous                             +----------+--------+-------+--------+----------------------+------------------+ ICA Distal58      23                                                      +----------+--------+-------+--------+----------------------+------------------+ ECA       77      12             smooth and  heterogenous                             +----------+--------+-------+--------+----------------------+------------------+ +----------+--------+-------+----------------+------------+           PSV cm/sEDV cmsDescribe        Arm Pressure +----------+--------+-------+----------------+------------+ Subclavian116             Multiphasic, WNL             +----------+--------+-------+----------------+------------+ +---------+--------+--+--------+--+---------+ VertebralPSV cm/s44EDV cm/s18Antegrade +---------+--------+--+--------+--+---------+ Left Carotid Findings: +----------+--------+--------+--------+-----------------------+--------+           PSV cm/sEDV cm/sStenosisDescribe               Comments +----------+--------+--------+--------+-----------------------+--------+ CCA Prox  66      17                                              +----------+--------+--------+--------+-----------------------+--------+ CCA Distal59      21                                              +----------+--------+--------+--------+-----------------------+--------+ ICA Prox  53      20              smooth and heterogenous         +----------+--------+--------+--------+-----------------------+--------+ ICA Distal44      17                                              +----------+--------+--------+--------+-----------------------+--------+ ECA       73      8                                               +----------+--------+--------+--------+-----------------------+--------+  +----------+--------+--------+----------------+------------+ SubclavianPSV cm/sEDV cm/sDescribe        Arm Pressure +----------+--------+--------+----------------+------------+           121             Multiphasic, WNL             +----------+--------+--------+----------------+------------+ +---------+--------+--+--------+----------------------+ VertebralPSV cm/s10EDV cm/sAntegrade and Atypical +---------+--------+--+--------+----------------------+  ABI Findings: +--------+------------------+-----+---------+--------+ Right   Rt Pressure (mmHg)IndexWaveform Comment  +--------+------------------+-----+---------+--------+ [id number]                    triphasic          +--------+------------------+-----+---------+--------+ PTA     139               1.19 triphasic         +--------+------------------+-----+---------+--------+ DP      144               1.23 biphasic          +--------+------------------+-----+---------+--------+ +--------+------------------+-----+---------+-------+ Left    Lt Pressure (mmHg)IndexWaveform Comment +--------+------------------+-----+---------+-------+ [id number]                    triphasic        +--------+------------------+-----+---------+-------+ PTA     135  1.15 triphasic        +--------+------------------+-----+---------+-------+ DP      139               1.19 triphasic        +--------+------------------+-----+---------+-------+ +-------+---------------+----------------+ ABI/TBIToday's ABI/TBIPrevious ABI/TBI +-------+---------------+----------------+ Right  1.23                            +-------+---------------+----------------+ Left   1.19                            +-------+---------------+----------------+  Right Doppler Findings: +-----------+--------+-----+---------+-----------------------------------------+ Site       PressureIndexDoppler  Comments                                  +-----------+--------+-----+---------+-----------------------------------------+ Brachial   113          triphasic                                          +-----------+--------+-----+---------+-----------------------------------------+ Radial                  triphasic                                          +-----------+--------+-----+---------+-----------------------------------------+ Ulnar                   triphasic                                          +-----------+--------+-----+---------+-----------------------------------------+ Palmar Arch                      Signal decreases >50% with radial                                          compression,  signal is unaffected with                                     ulnar compression.                        +-----------+--------+-----+---------+-----------------------------------------+  Left Doppler Findings: +-----------+--------+-----+---------+-----------------------------------------+ Site       PressureIndexDoppler  Comments                                  +-----------+--------+-----+---------+-----------------------------------------+ Brachial   117          triphasic                                          +-----------+--------+-----+---------+-----------------------------------------+ Radial                  triphasic                                          +-----------+--------+-----+---------+-----------------------------------------+  Ulnar                   triphasic                                          +-----------+--------+-----+---------+-----------------------------------------+ Palmar Arch                      Signal obliterates with radial                                             compression, is unaffected with ulnar                                      compression.                              +-----------+--------+-----+---------+-----------------------------------------+   Summary: Right Carotid: The extracranial vessels were near-normal with only minimal wall                thickening or plaque. Left Carotid: The extracranial vessels were near-normal with only minimal wall               thickening or plaque. Vertebrals:  Bilateral vertebral arteries demonstrate antegrade flow. Left              vertebral artery demonstrates high resistant flow. Subclavians: Normal flow hemodynamics were seen in bilateral subclavian              arteries. Right ABI: Resting right ankle-brachial index is within normal range. Left ABI: Resting left ankle-brachial index is within normal range. Right Upper Extremity: Doppler waveforms decrease >50% with right  radial compression. Doppler waveforms remain within normal limits with right ulnar compression. Left Upper Extremity: Doppler waveform obliterate with left radial compression. Doppler waveforms remain within normal limits with left ulnar compression.  Electronically signed by Servando Snare MD on 08/20/2022 at 5:27:04 PM.    Final    ECHOCARDIOGRAM COMPLETE  Result Date: 08/19/2022    ECHOCARDIOGRAM REPORT   Patient Name:   Randy Myers Date of Exam: 08/19/2022 Medical Rec #:  [id number]       Height:       71.0 in Accession #:    [id number]      Weight:       195.0 lb Date of Birth:  1946-08-14        BSA:          2.086 m Patient Age:    69 years        BP:           128/65 mmHg Patient Gender: M               HR:           62 bpm. Exam Location:  Inpatient Procedure: 2D Echo, Cardiac Doppler, Color Doppler and Intracardiac            Opacification Agent Indications:    CAD native vessel  History:        Patient has prior history of Echocardiogram examinations, most  recent 06/11/2013. Angina; Risk Factors:Hypertension and                 Dyslipidemia. Hx CVA.  Sonographer:    Clayton Lefort RDCS (AE) Referring Phys: Earl Comments: Suboptimal subcostal window. IMPRESSIONS  1. Akinesis of the distal anteroseptal and apical walls with overall moderate to severe LV dysfunction; probable early apical thrombus noted using definity; calcified aortic valve with partial fusion of noncoronary and left cusps; no significant AS by doppler.  2. Left ventricular ejection fraction, by estimation, is 30 to 35%. The left ventricle has moderate to severely decreased function. The left ventricle demonstrates regional wall motion abnormalities (see scoring diagram/findings for description). There is mild concentric left ventricular hypertrophy. Left ventricular diastolic parameters are consistent with Grade I diastolic dysfunction (impaired relaxation).  3. Right ventricular systolic  function is normal. The right ventricular size is normal.  4. The mitral valve is normal in structure. No evidence of mitral valve regurgitation. No evidence of mitral stenosis. Moderate mitral annular calcification.  5. The aortic valve is calcified. Aortic valve regurgitation is not visualized. No aortic stenosis is present.  6. Aortic dilatation noted. There is borderline dilatation of the aortic root, measuring 38 mm. FINDINGS  Left Ventricle: Left ventricular ejection fraction, by estimation, is 30 to 35%. The left ventricle has moderate to severely decreased function. The left ventricle demonstrates regional wall motion abnormalities. Definity contrast agent was given IV to delineate the left ventricular endocardial borders. The left ventricular internal cavity size was normal in size. There is mild concentric left ventricular hypertrophy. Left ventricular diastolic parameters are consistent with Grade I diastolic dysfunction (impaired relaxation). Right Ventricle: The right ventricular size is normal. Right ventricular systolic function is normal. Left Atrium: Left atrial size was normal in size. Right Atrium: Right atrial size was normal in size. Pericardium: There is no evidence of pericardial effusion. Mitral Valve: The mitral valve is normal in structure. Moderate mitral annular calcification. No evidence of mitral valve regurgitation. No evidence of mitral valve stenosis. Tricuspid Valve: The tricuspid valve is normal in structure. Tricuspid valve regurgitation is trivial. No evidence of tricuspid stenosis. Aortic Valve: The aortic valve is calcified. Aortic valve regurgitation is not visualized. No aortic stenosis is present. Aortic valve mean gradient measures 4.0 mmHg. Aortic valve peak gradient measures 7.4 mmHg. Aortic valve area, by VTI measures 2.44 cm. Pulmonic Valve: The pulmonic valve was normal in structure. Pulmonic valve regurgitation is not visualized. No evidence of pulmonic stenosis.  Aorta: Aortic dilatation noted. There is borderline dilatation of the aortic root, measuring 38 mm. Venous: The inferior vena cava was not well visualized. IAS/Shunts: No atrial level shunt detected by color flow Doppler. Additional Comments: Akinesis of the distal anteroseptal and apical walls with overall moderate to severe LV dysfunction; probable early apical thrombus noted using definity; calcified aortic valve with partial fusion of noncoronary and left cusps; no significant AS by doppler.  LEFT VENTRICLE PLAX 2D LVIDd:         4.90 cm      Diastology LVIDs:         3.80 cm      LV e' medial:    4.13 cm/s LV PW:         1.00 cm      LV E/e' medial:  12.3 LV IVS:        1.20 cm      LV e' lateral:   4.03 cm/s LVOT diam:  2.20 cm      LV E/e' lateral: 12.6 LV SV:         62 LV SV Index:   30 LVOT Area:     3.80 cm  LV Volumes (MOD) LV vol d, MOD A4C: 151.0 ml LV vol s, MOD A4C: 80.2 ml LV SV MOD A4C:     151.0 ml RIGHT VENTRICLE RV Basal diam:  2.80 cm RV S prime:     7.29 cm/s TAPSE (M-mode): 2.0 cm LEFT ATRIUM             Index        RIGHT ATRIUM           Index LA diam:        4.50 cm 2.16 cm/m   RA Area:     13.60 cm LA Vol (A2C):   56.2 ml 26.94 ml/m  RA Volume:   26.80 ml  12.85 ml/m LA Vol (A4C):   57.9 ml 27.76 ml/m LA Biplane Vol: 57.7 ml 27.66 ml/m  AORTIC VALVE AV Area (Vmax):    2.11 cm AV Area (Vmean):   2.03 cm AV Area (VTI):     2.44 cm AV Vmax:           136.00 cm/s AV Vmean:          92.400 cm/s AV VTI:            0.255 m AV Peak Grad:      7.4 mmHg AV Mean Grad:      4.0 mmHg LVOT Vmax:         75.40 cm/s LVOT Vmean:        49.300 cm/s LVOT VTI:          0.164 m LVOT/AV VTI ratio: 0.64  AORTA Ao Root diam: 3.80 cm MITRAL VALVE MV Area (PHT): 2.57 cm    SHUNTS MV Decel Time: 295 msec    Systemic VTI:  0.16 m MV E velocity: 50.80 cm/s  Systemic Diam: 2.20 cm MV A velocity: 92.80 cm/s MV E/A ratio:  0.55 Kirk Ruths MD Electronically signed by Kirk Ruths MD Signature  Date/Time: 08/19/2022/4:39:12 PM    Final    CARDIAC CATHETERIZATION  Result Date: 08/19/2022   Ost RCA to Prox RCA lesion is 40% stenosed.   Prox RCA to Mid RCA lesion is 100% stenosed.   Mid LM to Dist LM lesion is 40% stenosed.   Ost LAD to Prox LAD lesion is 40% stenosed.   Prox LAD to Mid LAD lesion is 99% stenosed.   2nd Mrg lesion is 70% stenosed.   Prox Cx to Mid Cx lesion is 70% stenosed.   There is moderate to severe left ventricular systolic dysfunction.   LV end diastolic pressure is normal.   The left ventricular ejection fraction is 25-35% by visual estimate. 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 The mid and distal vessel fills briskly from left to right collaterals. Severe segmental LV dysfunction with hypokinesis of the anterior wall. LVEDP 15 mmHg Recommendations: I think the best strategy for revascularization Esiah Bazinet be bypass surgery. The LAD stenosis is a long segment and there is almost complete disconnection through the lesion. This also involves a large septal that supplies collaterals to the occluded RCA. PCI of the LAD could potentially disrupt collateral flow. LV  gram suggests at least moderate LV systolic dysfunction with severe hypokinesis of the anterior wall. Mardee Clune admit to telemetry as he is having unstable symptoms at home. Aidan Caloca consult CT surgery for CABG. Gillermo Poch hold Plavix today and allow washout. Slater Mcmanaman start ASA and Devoiry Corriher start IV heparin 6 hours post sheath pull given the severity of the LAD disease. High intensity statin.      Cardiac Studies & Procedures   CARDIAC CATHETERIZATION  CARDIAC CATHETERIZATION 08/19/2022  Narrative   Ost RCA to Prox RCA lesion is 40% stenosed.   Prox RCA to Mid RCA lesion is 100% stenosed.   Mid LM to Dist LM lesion is 40% stenosed.   Ost LAD to Prox LAD lesion is 40%  stenosed.   Prox LAD to Mid LAD lesion is 99% stenosed.   2nd Mrg lesion is 70% stenosed.   Prox Cx to Mid Cx lesion is 70% stenosed.   There is moderate to severe left ventricular systolic dysfunction.   LV end diastolic pressure is normal.   The left ventricular ejection fraction is 25-35% by visual estimate.  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 The mid and distal vessel fills briskly from left to right collaterals. Severe segmental LV dysfunction with hypokinesis of the anterior wall. LVEDP 15 mmHg  Recommendations: I think the best strategy for revascularization Egan Berkheimer be bypass surgery. The LAD stenosis is a long segment and there is almost complete disconnection through the lesion. This also involves a large septal that supplies collaterals to the occluded RCA. PCI of the LAD could potentially disrupt collateral flow. LV gram suggests at least moderate LV systolic dysfunction with severe hypokinesis of the anterior wall. Kahne Helfand admit to telemetry as he is having unstable symptoms at home. Exzavier Ruderman consult CT surgery for CABG. Anaria Kroner hold Plavix today and allow washout. Erhardt Dada start ASA and Mouhamadou Gittleman start IV heparin 6 hours post sheath pull given the severity of the LAD disease. High intensity statin.  Findings Coronary Findings Diagnostic  Dominance: Right  Left Main Mid LM to Dist LM lesion is 40% stenosed.  Left Anterior Descending Ost LAD to Prox LAD lesion is 40% stenosed. Prox LAD to Mid LAD lesion is 99% stenosed.  Left Circumflex Prox Cx to Mid Cx lesion is 70% stenosed.  Second Obtuse Marginal Branch 2nd Mrg lesion is 70% stenosed.  Right Coronary Artery Vessel is large. Ost RCA to Prox RCA lesion is 40% stenosed. Prox RCA to Mid RCA lesion is 100% stenosed. The lesion is chronically occluded.  Third Right  Posterolateral Branch Collaterals 3rd RPL filled by collaterals from 2nd Sept.  Collaterals 3rd RPL filled by collaterals from 1st Sept.  Intervention  No interventions have been documented.     ECHOCARDIOGRAM  ECHOCARDIOGRAM COMPLETE 08/19/2022  Narrative ECHOCARDIOGRAM REPORT    Patient Name:   Randy Myers Date of Exam: 08/19/2022 Medical Rec #:  [id number]       Height:       71.0 in Accession #:    [id number]      Weight:       195.0 lb Date of Birth:  [birth year]        BSA:          2.086 m Patient Age:    75 years        BP:  128/65 mmHg Patient Gender: M               HR:           62 bpm. Exam Location:  Inpatient  Procedure: 2D Echo, Cardiac Doppler, Color Doppler and Intracardiac Opacification Agent  Indications:    CAD native vessel  History:        Patient has prior history of Echocardiogram examinations, most recent 06/11/2013. Angina; Risk Factors:Hypertension and Dyslipidemia. Hx CVA.  Sonographer:    Clayton Lefort RDCS (AE) Referring Phys: [place] Comments: Suboptimal subcostal window. IMPRESSIONS   1. Akinesis of the distal anteroseptal and apical walls with overall moderate to severe LV dysfunction; probable early apical thrombus noted using definity; calcified aortic valve with partial fusion of noncoronary and left cusps; no significant AS by doppler. 2. Left ventricular ejection fraction, by estimation, is 30 to 35%. The left ventricle has moderate to severely decreased function. The left ventricle demonstrates regional wall motion abnormalities (see scoring diagram/findings for description). There is mild concentric left ventricular hypertrophy. Left ventricular diastolic parameters are consistent with Grade I diastolic dysfunction (impaired relaxation). 3. Right ventricular systolic function is normal. The right ventricular size is normal. 4. The mitral valve is normal in structure. No evidence of  mitral valve regurgitation. No evidence of mitral stenosis. Moderate mitral annular calcification. 5. The aortic valve is calcified. Aortic valve regurgitation is not visualized. No aortic stenosis is present. 6. Aortic dilatation noted. There is borderline dilatation of the aortic root, measuring 38 mm.  FINDINGS Left Ventricle: Left ventricular ejection fraction, by estimation, is 30 to 35%. The left ventricle has moderate to severely decreased function. The left ventricle demonstrates regional wall motion abnormalities. Definity contrast agent was given IV to delineate the left ventricular endocardial borders. The left ventricular internal cavity size was normal in size. There is mild concentric left ventricular hypertrophy. Left ventricular diastolic parameters are consistent with Grade I diastolic dysfunction (impaired relaxation).  Right Ventricle: The right ventricular size is normal. Right ventricular systolic function is normal.  Left Atrium: Left atrial size was normal in size.  Right Atrium: Right atrial size was normal in size.  Pericardium: There is no evidence of pericardial effusion.  Mitral Valve: The mitral valve is normal in structure. Moderate mitral annular calcification. No evidence of mitral valve regurgitation. No evidence of mitral valve stenosis.  Tricuspid Valve: The tricuspid valve is normal in structure. Tricuspid valve regurgitation is trivial. No evidence of tricuspid stenosis.  Aortic Valve: The aortic valve is calcified. Aortic valve regurgitation is not visualized. No aortic stenosis is present. Aortic valve mean gradient measures 4.0 mmHg. Aortic valve peak gradient measures 7.4 mmHg. Aortic valve area, by VTI measures 2.44 cm.  Pulmonic Valve: The pulmonic valve was normal in structure. Pulmonic valve regurgitation is not visualized. No evidence of pulmonic stenosis.  Aorta: Aortic dilatation noted. There is borderline dilatation of the aortic root,  measuring 38 mm.  Venous: The inferior vena cava was not well visualized.  IAS/Shunts: No atrial level shunt detected by color flow Doppler.  Additional Comments: Akinesis of the distal anteroseptal and apical walls with overall moderate to severe LV dysfunction; probable early apical thrombus noted using definity; calcified aortic valve with partial fusion of noncoronary and left cusps; no significant AS by doppler.   LEFT VENTRICLE PLAX 2D LVIDd:         4.90 cm      Diastology LVIDs:  3.80 cm      LV e' medial:    4.13 cm/s LV PW:         1.00 cm      LV E/e' medial:  12.3 LV IVS:        1.20 cm      LV e' lateral:   4.03 cm/s LVOT diam:     2.20 cm      LV E/e' lateral: 12.6 LV SV:         62 LV SV Index:   30 LVOT Area:     3.80 cm  LV Volumes (MOD) LV vol d, MOD A4C: 151.0 ml LV vol s, MOD A4C: 80.2 ml LV SV MOD A4C:     151.0 ml  RIGHT VENTRICLE RV Basal diam:  2.80 cm RV S prime:     7.29 cm/s TAPSE (M-mode): 2.0 cm  LEFT ATRIUM             Index        RIGHT ATRIUM           Index LA diam:        4.50 cm 2.16 cm/m   RA Area:     13.60 cm LA Vol (A2C):   56.2 ml 26.94 ml/m  RA Volume:   26.80 ml  12.85 ml/m LA Vol (A4C):   57.9 ml 27.76 ml/m LA Biplane Vol: 57.7 ml 27.66 ml/m AORTIC VALVE AV Area (Vmax):    2.11 cm AV Area (Vmean):   2.03 cm AV Area (VTI):     2.44 cm AV Vmax:           136.00 cm/s AV Vmean:          92.400 cm/s AV VTI:            0.255 m AV Peak Grad:      7.4 mmHg AV Mean Grad:      4.0 mmHg LVOT Vmax:         75.40 cm/s LVOT Vmean:        49.300 cm/s LVOT VTI:          0.164 m LVOT/AV VTI ratio: 0.64  AORTA Ao Root diam: 3.80 cm  MITRAL VALVE MV Area (PHT): 2.57 cm    SHUNTS MV Decel Time: 295 msec    Systemic VTI:  0.16 m MV E velocity: 50.80 cm/s  Systemic Diam: 2.20 cm MV A velocity: 92.80 cm/s MV E/A ratio:  0.55  Kirk Ruths MD Electronically signed by Kirk Ruths MD Signature Date/Time:  08/19/2022/4:39:12 PM    Final   TEE  ECHO INTRAOPERATIVE TEE 08/25/2022  Narrative *INTRAOPERATIVE TRANSESOPHAGEAL REPORT *    Patient Name:   Randy Myers Date of Exam: 08/24/2022 Medical Rec #:  [id number]       Height:       71.0 in Accession #:    [id number]      Weight:       190.8 lb Date of Birth:  11-26-1945        BSA:          2.07 m Patient Age:    43 years        BP:           112/74 mmHg Patient Gender: M               HR:           55 bpm. Exam Location:  Anesthesiology  Transesophogeal  exam was perform intraoperatively during surgical procedure. Patient was closely monitored under general anesthesia during the entirety of examination.  Indications:     CAD Native Vessel i25.10 Performing Phys: [id number] Lucile Crater LIGHTFOOT Diagnosing Phys: Belinda Block MD  POST-OP IMPRESSIONS _ Left Ventricle: Post Bypass: The patient came off bypass on the initial attempt on noted OR pressors. There did not appear to be any new findings form preop exam (Dr. Marcie Bal performed Post Bypass inital exam). Left ventricular contraction did improve with volume replacement. The TEE that had been placed after induction uneventfully was removed at the end of the case without difficulty. The patient was later taken to the SICU in stable conditon.  PRE-OP FINDINGS Left Ventricle: The left ventricle has moderate-severely reduced systolic function, with an ejection fraction of 30-35%.   Left Atrium: No left atrial/left atrial appendage thrombus was detected.  Mitral Valve: The mitral valve is normal in structure. Mitral valve regurgitation is trivial by color flow Doppler. There is moderate calcification present.  Tricuspid Valve: The tricuspid valve was normal in structure.  Aortic Valve: The aortic valve is tricuspid Aortic valve regurgitation was not visualized by color flow Doppler. There is moderate calcification present.   Belinda Block MD Electronically signed by  Belinda Block MD Signature Date/Time: 08/25/2022/10:38:33 AM   Final            EKG: most recent - 10/26 afib w RVR, rate 180bpm (personally reviewed)  TELEMETRY: afib with rates <130, had some SR yesterday (personally reviewed)   Assessment/Plan: #) Afib w RVR Appears to have started at least as early as 10/26 No h/o afib prior to recent CABG x 3 CHA2DS2-VASc Score = 5 (CHF, HTN, Vasc Dz, Age) AC with eliquis, appropriately dosed; CrCl 70.45 today Patient feels well currently, walking unit without complaints has rec'd ~5g amiodarone so far in PO and IV Continue PO amio 400 BID at this time Switch from 6.25 coreg to '25mg'$  metop q6 starting this PM   Keep K>4, Mag >2   #) HFrEF #) CAD s/p CABG x 3 #) HTN - all per primary   For questions or updates, please contact [facility] Please consult [email or web address] for contact info under Cardiology/STEMI.  Signed, Mamie Levers, NP  08/31/2022 10:32 AM   I have seen and examined this patient with Mamie Levers.  Agree with above, note added to reflect my findings.  He has a history significant for coronary disease, hypertension, CVA, prostate cancer.  He presented to the hospital with anginal symptoms.  He was found to have multivessel coronary artery disease.  He is now status post CABG x3.  He has gone into intermittent atrial fibrillation which has been difficult to control.  He has been on and off IV amiodarone as he has been converting in and out of atrial fibrillation.  He feels well and is without complaint today other than mild palpitations.  GEN: Well nourished, well developed, in no acute distress  HEENT: normal  Neck: no JVD, carotid bruits, or masses Cardiac: Irregular, tachycardic; no murmurs, rubs, or gallops,no edema  Respiratory:  clear to auscultation bilaterally, normal work of breathing GI: soft, nontender, nondistended, + BS MS: no deformity or atrophy  Skin: warm and dry Neuro:  Strength and sensation  are intact Psych: euthymic mood, full affect   Atrial fibrillation with rapid ventricular response: Currently on Eliquis.  CHA2DS2-VASc 5.  Atrial fibrillation is likely due to his most recent CABG.  Agree with p.o. amiodarone for now.  We Shiree Altemus stop his carvedilol and switch him to metoprolol 25 mg every 6.  If we can achieve rate control with heart rates in the 90s to low 100s, he could potentially be discharged.  We Olufemi Mofield arrange for follow-up in A-fib clinic.  Deroy Noah M. Nicanor Mendolia MD 08/31/2022 5:21 PM

## 2022-08-31 NOTE — Progress Notes (Addendum)
7 Days Post-Op Procedure(s) (LRB): CORONARY ARTERY BYPASS GRAFTING (CABG) x3, USING LEFT INTERNAL MAMMARY ARTERY AND ENDOSCOPICALLY HARVESTED RIGHT GREATER SAPHENOUS VEIN (N/A) TRANSESOPHAGEAL ECHOCARDIOGRAM (TEE) (N/A) Subjective: Feels well, not having palpitations  Objective: Vital signs in last 24 hours: Temp:  [97.8 F (36.6 C)-98.7 F (37.1 C)] 98.4 F (36.9 C) (10/31 0347) Pulse Rate:  [49-95] 86 (10/31 0600) Cardiac Rhythm: Atrial fibrillation (10/31 0400) Resp:  [14-28] 23 (10/31 0600) BP: (103-128)/(62-90) 106/75 (10/31 0600) SpO2:  [90 %-97 %] 91 % (10/31 0600) Weight:  [85.6 kg] 85.6 kg (10/31 0500)  Hemodynamic parameters for last 24 hours:    Intake/Output from previous day: 10/30 0701 - 10/31 0700 In: 490 [P.O.:340; I.V.:150] Out: 850 [Urine:850] Intake/Output this shift: No intake/output data recorded.  General appearance: alert, cooperative, and no distress Heart: irregularly irregular rhythm and tachy Lungs: Clear Abdomen: Benign Extremities: No edema Wound: Incisions healing well without evidence of infection  Lab Results: Recent Labs    08/30/22 0558 08/31/22 0537  WBC 6.1 6.8  HGB 10.7* 11.3*  HCT 31.8* 33.4*  PLT 181 235   BMET:  Recent Labs    08/30/22 0558 08/31/22 0537  NA 138 141  K 3.8 4.2  CL 104 103  CO2 26 24  GLUCOSE 133* 134*  BUN 13 16  CREATININE 1.05 1.08  CALCIUM 8.5* 9.0    PT/INR: No results for input(s): "LABPROT", "INR" in the last 72 hours. ABG    Component Value Date/Time   PHART 7.370 08/25/2022 0132   HCO3 22.7 08/25/2022 0132   TCO2 24 08/25/2022 0132   ACIDBASEDEF 2.0 08/25/2022 0132   O2SAT 99 08/25/2022 0132   CBG (last 3)  Recent Labs    08/30/22 1138 08/30/22 1647 08/30/22 2113  GLUCAP 102* 100* 136*    Meds Scheduled Meds:  amiodarone  400 mg Oral BID   apixaban  5 mg Oral BID   atorvastatin  80 mg Oral Daily   bisacodyl  10 mg Oral Daily   Or   bisacodyl  10 mg Rectal Daily    carvedilol  6.25 mg Oral BID WC   Chlorhexidine Gluconate Cloth  6 each Topical Daily   docusate sodium  200 mg Oral Daily   furosemide  40 mg Oral BID   insulin aspart  0-24 Units Subcutaneous TID AC & HS   magnesium oxide  400 mg Oral BID   pantoprazole  40 mg Oral Daily   potassium chloride  20 mEq Oral BID   sodium chloride flush  3 mL Intravenous Q12H   spironolactone  12.5 mg Oral Daily   Continuous Infusions:  lactated ringers     PRN Meds:.docusate sodium, metoprolol tartrate, ondansetron (ZOFRAN) IV, mouth rinse, oxyCODONE, polyethylene glycol, sodium chloride flush, traMADol  Xrays No results found.  Assessment/Plan: S/P Procedure(s) (LRB): CORONARY ARTERY BYPASS GRAFTING (CABG) x3, USING LEFT INTERNAL MAMMARY ARTERY AND ENDOSCOPICALLY HARVESTED RIGHT GREATER SAPHENOUS VEIN (N/A) TRANSESOPHAGEAL ECHOCARDIOGRAM (TEE) (N/A) POD #7  1 afebrile, still having some issues with atrial fibrillation-continue amiodarone, Coreg and Eliquis, consider addition of baby aspirin, SBP 103-128, heart rate up to the 150s at times.  We will consult cardiology to assist with A-fib management 2 oxygen saturations are good on room air 3 good urine output, weight is well below preop, I think we can stop Lasix 4  blood sugars well controlled 5 normal renal function 6 normal magnesium and potassium 7 expected acute blood loss anemia with continued improving trend with equilibration  8 routine pulmonary hygiene and rehab as able    LOS: 12 days    Odis Luster Pager [phone] 08/31/2022   Agree with above Another run of afib with RVR EP consulted Will keep in unit until better rate control  Judah Chevere O Demica Zook

## 2022-08-31 NOTE — Progress Notes (Signed)
Pt declined ambulation due to walking earlier with RN. Spent time educating pt on restrictions.

## 2022-09-01 ENCOUNTER — Telehealth: Payer: Self-pay | Admitting: Thoracic Surgery (Cardiothoracic Vascular Surgery)

## 2022-09-01 DIAGNOSIS — I4891 Unspecified atrial fibrillation: Secondary | ICD-10-CM | POA: Diagnosis not present

## 2022-09-01 DIAGNOSIS — I480 Paroxysmal atrial fibrillation: Secondary | ICD-10-CM

## 2022-09-01 LAB — CBC
HCT: 34.2 % — ABNORMAL LOW (ref 39.0–52.0)
Hemoglobin: 11.2 g/dL — ABNORMAL LOW (ref 13.0–17.0)
MCH: 30.3 pg (ref 26.0–34.0)
MCHC: 32.7 g/dL (ref 30.0–36.0)
MCV: 92.4 fL (ref 80.0–100.0)
Platelets: 249 10*3/uL (ref 150–400)
RBC: 3.7 MIL/uL — ABNORMAL LOW (ref 4.22–5.81)
RDW: 14.2 % (ref 11.5–15.5)
WBC: 7.2 10*3/uL (ref 4.0–10.5)
nRBC: 0 % (ref 0.0–0.2)

## 2022-09-01 LAB — BASIC METABOLIC PANEL
Anion gap: 9 (ref 5–15)
BUN: 22 mg/dL (ref 8–23)
CO2: 25 mmol/L (ref 22–32)
Calcium: 8.8 mg/dL — ABNORMAL LOW (ref 8.9–10.3)
Chloride: 105 mmol/L (ref 98–111)
Creatinine, Ser: 1.28 mg/dL — ABNORMAL HIGH (ref 0.61–1.24)
GFR, Estimated: 58 mL/min — ABNORMAL LOW (ref >60)
Glucose, Bld: 109 mg/dL — ABNORMAL HIGH (ref 70–99)
Potassium: 4.8 mmol/L (ref 3.5–5.1)
Sodium: 139 mmol/L (ref 135–145)

## 2022-09-01 LAB — GLUCOSE, CAPILLARY
Glucose-Capillary: 131 mg/dL — ABNORMAL HIGH (ref 70–99)
Glucose-Capillary: 115 mg/dL — ABNORMAL HIGH (ref 70–99)
Glucose-Capillary: 114 mg/dL — ABNORMAL HIGH (ref 70–99)
Glucose-Capillary: 116 mg/dL — ABNORMAL HIGH (ref 70–99)

## 2022-09-01 MED ORDER — SODIUM CHLORIDE 0.9% FLUSH
3.0000 mL | Freq: Two times a day (BID) | INTRAVENOUS | Status: DC
  Administered 2022-09-01 (×2): 3 mL via INTRAVENOUS

## 2022-09-01 MED ORDER — SODIUM CHLORIDE 0.9 % IV SOLN: 250.0000 mL | INTRAVENOUS | Status: DC | PRN

## 2022-09-01 MED ORDER — SODIUM CHLORIDE 0.9% FLUSH: 3.0000 mL | INTRAVENOUS | Status: DC | PRN

## 2022-09-01 MED ORDER — ~~LOC~~ CARDIAC SURGERY, PATIENT & FAMILY EDUCATION: Freq: Once | Status: AC

## 2022-09-01 MED ORDER — METOPROLOL SUCCINATE ER 50 MG PO TB24
50.0000 mg | ORAL_TABLET | Freq: Every day | ORAL | Status: DC
  Administered 2022-09-01 – 2022-09-02 (×2): 50 mg via ORAL
  Filled 2022-09-01 (×3): qty 1

## 2022-09-01 NOTE — Progress Notes (Addendum)
Electrophysiology Rounding Note  Patient Name: Randy Myers Date of Encounter: 09/01/2022  Primary Cardiologist: None Electrophysiologist:    Subjective   NAEON. Good appetite, feeling well this AM without complaints Did notice when he went into afib this AM. Very ready to go home  Inpatient Medications    Scheduled Meds:  amiodarone  400 mg Oral BID   apixaban  5 mg Oral BID   aspirin EC  81 mg Oral Daily   atorvastatin  80 mg Oral Daily   bisacodyl  10 mg Oral Daily   Or   bisacodyl  10 mg Rectal Daily   Chlorhexidine Gluconate Cloth  6 each Topical Daily   docusate sodium  200 mg Oral Daily   insulin aspart  0-24 Units Subcutaneous TID AC & HS   magnesium oxide  400 mg Oral BID   metoprolol succinate  50 mg Oral Daily   pantoprazole  40 mg Oral Daily   sodium chloride flush  3 mL Intravenous Q12H   spironolactone  12.5 mg Oral Daily   Continuous Infusions:  lactated ringers     PRN Meds: docusate sodium, metoprolol tartrate, ondansetron (ZOFRAN) IV, mouth rinse, oxyCODONE, polyethylene glycol, sodium chloride flush, traMADol   Vital Signs    Vitals:   09/01/22 0602 09/01/22 0630 09/01/22 0700 09/01/22 0800  BP: 130/88 (!) 89/50 103/67 122/88  Pulse: (!) 130 66 63 (!) 56  Resp: 19 20 (!) 23 20  Temp:      TempSrc:      SpO2: 95% 91% 91% (!) 82%  Weight:      Height:        Intake/Output Summary (Last 24 hours) at 09/01/2022 1017 Last data filed at 08/31/2022 1600 Gross per 24 hour  Intake --  Output 500 ml  Net -500 ml   Filed Weights   08/30/22 0500 08/31/22 0500 09/01/22 0500  Weight: 88 kg 85.6 kg 85.8 kg    Physical Exam    Grossly unchanged from prior GEN- The patient is well appearing, alert and oriented x 3 today, sitting in chair Head- normocephalic, atraumatic Eyes-  Sclera clear, conjunctiva pink Ears- hearing intact Oropharynx- clear Neck- supple Lungs- Clear to ausculation bilaterally, normal work of breathing Heart-  Regular rate and rhythm, no murmurs, rubs or gallops GI- soft, NT, ND, + BS Extremities- no clubbing or cyanosis. No edema Skin- no rash or lesion Psych- euthymic mood, full affect, very pleasant Neuro- strength and sensation are intact  Labs    CBC Recent Labs    08/31/22 0537 09/01/22 0353  WBC 6.8 7.2  HGB 11.3* 11.2*  HCT 33.4* 34.2*  MCV 91.3 92.4  PLT 235 510   Basic Metabolic Panel Recent Labs    08/30/22 0558 08/31/22 0537 09/01/22 0353  NA 138 141 139  K 3.8 4.2 4.8  CL 104 103 105  CO2 '26 24 25  '$ GLUCOSE 133* 134* 109*  BUN '13 16 22  '$ CREATININE 1.05 1.08 1.28*  CALCIUM 8.5* 9.0 8.8*  MG 1.7 2.1  --    Liver Function Tests No results for input(s): "AST", "ALT", "ALKPHOS", "BILITOT", "PROT", "ALBUMIN" in the last 72 hours. No results for input(s): "LIPASE", "AMYLASE" in the last 72 hours. Cardiac Enzymes No results for input(s): "CKTOTAL", "CKMB", "CKMBINDEX", "TROPONINI" in the last 72 hours.   EKG    No new (personally reviewed)  Telemetry    Vast majority of yesterday in SR, rates 60s Afib this AM ~0600 rates  120/130s (personally reviewed)  Radiology    No results found.  Patient Profile     Randy Myers is a 76 y.o. male with a past medical history significant for arthritis, CAD, HTN, CVA (residual r-side deficits), Prostate Ca,  HFrEF, s/p CABG x 3.  he was admitted for CAD, underwent CABG x 3, and now with post-op afib.   Assessment & Plan    #) Afib w RVR Appears to have started at least as early as 10/26 No h/o afib prior to recent CABG x 3 CHA2DS2-VASc Score = 5 (CHF, HTN, Vasc Dz, Age) AC with eliquis, appropriately dosed, Rx at discharge Patient continues to feel well, walking unit without complaints Has many hours of SR. Discussed with patient that converting back to afib is to be expected Continue PO amio 400 BID at this time x 2 weeks, then '400mg'$  daily x 2 weeks Consolidate lopressor to metop XL '50mg'$  BID Keep K>4, Mag  >2  Outpatient follow-up scheduled in afib clinic in ~2 weeks  OK to dc from EP perspective, though Alveta Quintela defer to primary team   #) HFrEF #) CAD s/p CABG x 3 #) HTN - all per primary   EP Ahmon Tosi sign off at this time, but remains available. Please reconsult if needed    For questions or updates, please contact [place] Please consult [email or web address] for contact info under Cardiology/STEMI.  Signed, Mamie Levers, NP  09/01/2022, 8:22 AM   I have seen and examined this patient with Mamie Levers.  Agree with above, note added to reflect my findings.  Converted to sinus rhythm.  Feeling well.  Had a short episode of atrial fibrillation, but otherwise without complaint.  GEN: Well nourished, well developed, in no acute distress  HEENT: normal  Neck: no JVD, carotid bruits, or masses Cardiac: RRR; no murmurs, rubs, or gallops,no edema  Respiratory:  clear to auscultation bilaterally, normal work of breathing GI: soft, nontender, nondistended, + BS MS: no deformity or atrophy  Skin: warm and dry Neuro:  Strength and sensation are intact Psych: euthymic mood, full affect   Atrial fibrillation with rapid response: Currently on amiodarone 400 mg twice daily, Eliquis 5 mg twice daily.  We Delontae Lamm continue amiodarone 400 mg twice daily for 2 weeks followed by 200 mg daily.  Would change metoprolol to Toprol-XL 50 mg twice daily.  We Esaiah Wanless arrange for follow-up in A-fib clinic.  At this point, as he is back in normal rhythm, EP to sign off.  Let us know if there is any further issues.  Doratha Mcswain M. Keagan Brislin MD 09/01/2022 10:10 AM

## 2022-09-01 NOTE — Progress Notes (Signed)
   Heart Failure Stewardship Pharmacist Progress Note   PCP: [name], MD PCP-Cardiologist: None    HPI:  76 YO male with PMH significant for CVA, prostate cancer, CABG x3, and HFrEF. He reported chest pain and SOB, n/v after walking short distances to his PCP on 08/11/2022. On 10/12, he saw cardiology and reported chest pain and SOB progressing over 2 weeks. EKG at that time showed NSR with non-specific ST changes with inferior infarct. He presented on 10/19 for elective cardiac catheterization. Procedure showed severe three vessel CAD with total occulusion of the mid RCA, sub-total occulusion of the mid LAD, and severe segmental LV dysfunction. ECHO on 10/19 showed EF 30-35% with moderate to severe LV dysfunction and grade I diastolic dysfunction. CABG X3 on 08/24/22. Postop complicated by persistent afib, which has now converted to NSR/sinus bradycardia. Noted clearance for discharge by EP.  Current HF Medications: Beta Blocker: metoprolol succinate 50 mg BID  MRA: spironolactone 12.5 mg daily  Prior to admission HF Medications: None  Pertinent Lab Values: Serum creatinine 1.28, BUN 16, Potassium 4.8, Sodium 139, Magnesium 2.1 (10/31), A1c 5.4  Vital Signs: Weight: 189.2 lbs (admission weight: 195 lbs) Blood pressure: 100/60s to 120/70s Heart rate: ~60 now in NSR  I/O: -1.5L yesterday; net -4.7L  Medication Assistance / Insurance Benefits Check: Does the patient have prescription insurance?  Yes Type of insurance plan: [employer] Medicare  Does the patient qualify for medication assistance through manufacturers or grants?   Pending Eligible grants and/or patient assistance programs: pending Medication assistance applications in progress: none  Medication assistance applications approved: none Approved medication assistance renewals will be completed by: pending  Outpatient Pharmacy:  Prior to admission outpatient pharmacy: [facility] Is the patient willing to use [facility] pharmacy at discharge? Yes Is the patient willing to transition their outpatient pharmacy to utilize a [facility] outpatient pharmacy?   No   Assessment: 1. Acute on chronic systolic CHF (LVEF 63-33%), due to ICM. NYHA class I symptoms. - Strict I/Os and daily weights. Keep K>4 and Mag>2.  - Serum creatinine trending up slightly, agree with continuing to hold diuresis given no edema present - Carvedilol 6.25 mg BID was transitioned to metoprolol tartrate 25 mg every 6 hours, which was consolidated to metoprolol succinate 50 mg BID today - BP too soft for adding Entresto, may be able to add low dose ARB at first follow up after discharge - Continue spironolactone 12.5 mg daily, potassium up to 4.8 today - Consider adding Jardinace 10 mg daily to optimize GDMT in the setting of low BP   Plan: 1) Medication changes recommended at this time: -Start Jardiance 10 mg once daily  2) Patient assistance: -Copays: Delene Loll and Jardiance $45 and Farxiga $95  3)  Education  - Patient has been educated on current HF medications and potential additions to HF medication regimen - Patient verbalizes understanding that over the next few months, these medication doses may change and more medications may be added to optimize HF regimen - Patient has been educated on basic disease state pathophysiology and goals of therapy   Thank you for allowing pharmacy to participate in this patient's care.  Reatha Harps, PharmD PGY2 Pharmacy Resident 09/01/2022 11:35 AM

## 2022-09-01 NOTE — Plan of Care (Signed)
  Problem: Activity: Goal: Ability to return to baseline activity level will improve Outcome: Progressing   Problem: Cardiovascular: Goal: Vascular access site(s) Level 0-1 will be maintained Outcome: Progressing   Problem: Education: Goal: Knowledge of General Education information will improve Description: Including pain rating scale, medication(s)/side effects and non-pharmacologic comfort measures Outcome: Progressing   Problem: Activity: Goal: Risk for activity intolerance will decrease Outcome: Progressing   Problem: Nutrition: Goal: Adequate nutrition will be maintained Outcome: Progressing   Problem: Coping: Goal: Level of anxiety will decrease Outcome: Progressing

## 2022-09-01 NOTE — Progress Notes (Signed)
CARDIAC REHAB PHASE I   Pt sitting in chair with wife in visiting. Feeling well today and hoping to go home tomorrow. Walked 2 times today, tolerated well per pt. Not ready for another walk just yet he is resting from move to floor. Encouraged to walk once more today. IS today reached 2500 states practicing frequently.  Post [facility] home teaching including site care, move in the tub, sternal precautions, heart healthy diet, exercise guidelines, IS use at home, home needs at discharge, risk factors, restrictions and CRP2 reviewed. All questions and concerns addressed. Will refer to [facility] for CRP2. Will continue to follow.   [id number]    Vanessa Barbara, RN BSN 09/01/2022 2:03 PM

## 2022-09-01 NOTE — Progress Notes (Signed)
POD 7 CAB  HR 80s well controlled  In ICU looks to be predominantly for HR control, EP consulted.   I/O last 3 completed shifts: In: 34 [P.O.:460; I.V.:150] Out: 1800 [Urine:1800]     Latest Ref Rng & Units 08/31/2022    5:37 AM 08/30/2022    5:58 AM 08/29/2022    4:19 AM  CMP  Glucose 70 - 99 mg/dL 134  133  116   BUN 8 - 23 mg/dL '16  13  12   '$ Creatinine 0.61 - 1.24 mg/dL 1.08  1.05  0.91   Sodium 135 - 145 mmol/L 141  138  139   Potassium 3.5 - 5.1 mmol/L 4.2  3.8  3.8   Chloride 98 - 111 mmol/L 103  104  105   CO2 22 - 32 mmol/L '24  26  27   '$ Calcium 8.9 - 10.3 mg/dL 9.0  8.5  8.4        Latest Ref Rng & Units 08/31/2022    5:37 AM 08/30/2022    5:58 AM 08/29/2022    4:19 AM  CBC  WBC 4.0 - 10.5 K/uL 6.8  6.1  5.7   Hemoglobin 13.0 - 17.0 g/dL 11.3  10.7  10.3   Hematocrit 39.0 - 52.0 % 33.4  31.8  29.6   Platelets 150 - 400 K/uL 235  181  138

## 2022-09-01 NOTE — Progress Notes (Addendum)
[address]       [place] [address]             [phone]     8 Days Post-Op Procedure(s) (LRB): CORONARY ARTERY BYPASS GRAFTING (CABG) x3, USING LEFT INTERNAL MAMMARY ARTERY AND ENDOSCOPICALLY HARVESTED RIGHT GREATER SAPHENOUS VEIN (N/A) TRANSESOPHAGEAL ECHOCARDIOGRAM (TEE) (N/A) Subjective: Feels ok, back in sinus w/some brady  Objective: Vital signs in last 24 hours: Temp:  [98 F (36.7 C)-98.2 F (36.8 C)] 98.2 F (36.8 C) (11/01 0400) Pulse Rate:  [35-132] 56 (11/01 0800) Cardiac Rhythm: Normal sinus rhythm (11/01 0630) Resp:  [16-28] 20 (11/01 0800) BP: (81-130)/(50-93) 122/88 (11/01 0800) SpO2:  [82 %-96 %] 82 % (11/01 0800) Weight:  [85.8 kg] 85.8 kg (11/01 0500)  Hemodynamic parameters for last 24 hours:    Intake/Output from previous day: 10/31 0701 - 11/01 0700 In: -  Out: 700 [Urine:700] Intake/Output this shift: No intake/output data recorded.  General appearance: alert, cooperative, and no distress Heart: regular rate and rhythm Lungs: clear to auscultation bilaterally Abdomen: benign Extremities: no edema Wound: incis healing well  Lab Results: Recent Labs    08/31/22 0537 09/01/22 0353  WBC 6.8 7.2  HGB 11.3* 11.2*  HCT 33.4* 34.2*  PLT 235 249   BMET:  Recent Labs    08/31/22 0537 09/01/22 0353  NA 141 139  K 4.2 4.8  CL 103 105  CO2 24 25  GLUCOSE 134* 109*  BUN 16 22  CREATININE 1.08 1.28*  CALCIUM 9.0 8.8*    PT/INR: No results for input(s): "LABPROT", "INR" in the last 72 hours. ABG    Component Value Date/Time   PHART 7.370 08/25/2022 0132   HCO3 22.7 08/25/2022 0132   TCO2 24 08/25/2022 0132   ACIDBASEDEF 2.0 08/25/2022 0132   O2SAT 99 08/25/2022 0132   CBG (last 3)  Recent Labs    08/31/22 1645 08/31/22 2101 09/01/22 0618  GLUCAP 106* 140* 131*    Meds Scheduled Meds:  amiodarone  400 mg Oral BID   apixaban  5 mg Oral BID   aspirin EC  81 mg Oral Daily   atorvastatin  80 mg  Oral Daily   bisacodyl  10 mg Oral Daily   Or   bisacodyl  10 mg Rectal Daily   Chlorhexidine Gluconate Cloth  6 each Topical Daily   docusate sodium  200 mg Oral Daily   insulin aspart  0-24 Units Subcutaneous TID AC & HS   magnesium oxide  400 mg Oral BID   metoprolol succinate  50 mg Oral Daily   pantoprazole  40 mg Oral Daily   sodium chloride flush  3 mL Intravenous Q12H   spironolactone  12.5 mg Oral Daily   Continuous Infusions:  lactated ringers     PRN Meds:.docusate sodium, metoprolol tartrate, ondansetron (ZOFRAN) IV, mouth rinse, oxyCODONE, polyethylene glycol, sodium chloride flush, traMADol  Xrays No results found.  Assessment/Plan: S/P Procedure(s) (LRB): CORONARY ARTERY BYPASS GRAFTING (CABG) x3, USING LEFT INTERNAL MAMMARY ARTERY AND ENDOSCOPICALLY HARVESTED RIGHT GREATER SAPHENOUS VEIN (N/A) TRANSESOPHAGEAL ECHOCARDIOGRAM (TEE) (N/A) POD #8  1 afebrile, SBP 89-130, sinus rhythm/sinus bradycardia, EP now assisting with atrial fibrillation management 2 sats good on room air 3 adequate urine output 4 creatinine bumped a little to 1.28 5 expected acute blood loss anemia stable 6 cont pulm hygiene and rehab, home when rhythm meds are finalized    LOS: 13 days    Randy Giovanni  PA-C Pager [phone] 09/01/2022  Agree with above Appreciate EP Will transfer to floor  [place]

## 2022-09-01 NOTE — TOC Initial Note (Signed)
Transition of Care [facility]) - Initial/Assessment Note    Patient Details  Name: Randy Myers MRN: [id number] Date of Birth: 09-04-1946  [facility] CM/SW Contact:    Bethena Roys, RN Phone Number: 09/01/2022, 1:45 PM  Clinical Narrative:  Patient was discussed in morning rounds-POD-8 CABG. PTA patient was from home with spouse. Spouse was at the bedside during the visit. Patient states he has a rolling walker and cane in the home if needed. Patient reports that he was driving to PCP appointments prior to hospitalization. Case Manager discussed home health services and spouse states the patient will not need any home health once he gets home-she would be able to manage his care. Case Manager discussed that Latricia Heft will follow post hospitalization-MD office protocol just in case. Case Manager will continue to follow for additional needs as he progresses.                  Expected Discharge Plan: Home/Self Care Barriers to Discharge: Continued Medical Work up   Patient Goals and CMS Choice Patient states their goals for this hospitalization and ongoing recovery are:: to return home.   Choice offered to / list presented to : NA  Expected Discharge Plan and Services Expected Discharge Plan: Home/Self Care In-house Referral: NA Discharge Planning Services: CM Consult Post Acute Care Choice: NA Living arrangements for the past 2 months: Single Family Home                   DME Agency: NA      Prior Living Arrangements/Services Living arrangements for the past 2 months: Single Family Home Lives with:: Spouse Patient language and need for interpreter reviewed:: Yes Do you feel safe going back to the place where you live?: Yes      Need for Family Participation in Patient Care: Yes (Comment) Care giver support system in place?: Yes (comment) Current home services: DME (cane and rolling walker in the home.) Criminal Activity/Legal Involvement Pertinent to  Current Situation/Hospitalization: No - Comment as needed  Activities of Daily Living Home Assistive Devices/Equipment: None ADL Screening (condition at time of admission) Patient's cognitive ability adequate to safely complete daily activities?: Yes Is the patient deaf or have difficulty hearing?: No Does the patient have difficulty seeing, even when wearing glasses/contacts?: No Does the patient have difficulty concentrating, remembering, or making decisions?: No Patient able to express need for assistance with ADLs?: Yes Does the patient have difficulty dressing or bathing?: No Independently performs ADLs?: Yes (appropriate for developmental age) Does the patient have difficulty walking or climbing stairs?: No Weakness of Legs: None Weakness of Arms/Hands: None  Permission Sought/Granted Permission sought to share information with : Family Supports, Case Manager    Emotional Assessment Appearance:: Appears stated age Attitude/Demeanor/Rapport: Engaged Affect (typically observed): Appropriate Orientation: : Oriented to Situation, Oriented to  Time, Oriented to Place, Oriented to Self Alcohol / Substance Use: Not Applicable Psych Involvement: No (comment)  Admission diagnosis:  Unstable angina ([place]) [I20.0] S/P CABG x 3 [Z95.1] Patient Active Problem List   Diagnosis Date Noted   Paroxysmal A-fib ([place])    Persistent atrial fibrillation ([place]) 08/30/2022   S/P CABG x 3 08/24/2022   Multiple vessel coronary artery disease 08/20/2022   Ischemic cardiomyopathy 08/20/2022   Acute combined systolic and diastolic heart failure ([place]) 08/20/2022   Unstable angina ([place])    Fall from ladder 09/14/2019   Adenocarcinoma of prostate ([place]) 08/31/2017   Bunion 06/06/2017   Hammer  toes of both feet 06/06/2017   Tinea pedis of both feet 06/06/2017   Hyperlipidemia with target LDL less than 70 09/03/2014   Stroke, 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 [place] [place] [address] Phone: [phone] Fax: [facility] [address] [place] [address] Phone: [phone] Fax: [phone]   Social Determinants of Health (SDOH) Interventions Financial Strain Interventions: Intervention Not Indicated  Readmission Risk Interventions     No data to display

## 2022-09-02 ENCOUNTER — Other Ambulatory Visit (HOSPITAL_COMMUNITY): Payer: Self-pay

## 2022-09-02 LAB — CBC
HCT: 32.6 % — ABNORMAL LOW (ref 39.0–52.0)
Hemoglobin: 10.9 g/dL — ABNORMAL LOW (ref 13.0–17.0)
MCH: 30.4 pg (ref 26.0–34.0)
MCHC: 33.4 g/dL (ref 30.0–36.0)
MCV: 91.1 fL (ref 80.0–100.0)
Platelets: 259 10*3/uL (ref 150–400)
RBC: 3.58 MIL/uL — ABNORMAL LOW (ref 4.22–5.81)
RDW: 13.7 % (ref 11.5–15.5)
WBC: 7.6 10*3/uL (ref 4.0–10.5)
nRBC: 0 % (ref 0.0–0.2)

## 2022-09-02 LAB — BASIC METABOLIC PANEL
Anion gap: 9 (ref 5–15)
BUN: 24 mg/dL — ABNORMAL HIGH (ref 8–23)
CO2: 24 mmol/L (ref 22–32)
Calcium: 8.7 mg/dL — ABNORMAL LOW (ref 8.9–10.3)
Chloride: 106 mmol/L (ref 98–111)
Creatinine, Ser: 1.27 mg/dL — ABNORMAL HIGH (ref 0.61–1.24)
GFR, Estimated: 59 mL/min — ABNORMAL LOW (ref >60)
Glucose, Bld: 117 mg/dL — ABNORMAL HIGH (ref 70–99)
Potassium: 4.2 mmol/L (ref 3.5–5.1)
Sodium: 139 mmol/L (ref 135–145)

## 2022-09-02 LAB — GLUCOSE, CAPILLARY: Glucose-Capillary: 108 mg/dL — ABNORMAL HIGH (ref 70–99)

## 2022-09-02 MED ORDER — METOPROLOL SUCCINATE ER 50 MG PO TB24
50.0000 mg | ORAL_TABLET | Freq: Every day | ORAL | 1 refills | Status: DC
  Filled 2022-09-02 – 2022-09-27 (×3): qty 30, 30d supply, fill #0

## 2022-09-02 MED ORDER — TRAMADOL HCL 50 MG PO TABS
50.0000 mg | ORAL_TABLET | Freq: Four times a day (QID) | ORAL | 0 refills | Status: AC | PRN
  Filled 2022-09-02: qty 28, 7d supply, fill #0

## 2022-09-02 MED ORDER — SPIRONOLACTONE 25 MG PO TABS
12.5000 mg | ORAL_TABLET | Freq: Every day | ORAL | 1 refills | Status: DC
  Filled 2022-09-02 – 2022-09-27 (×3): qty 30, 60d supply, fill #0

## 2022-09-02 MED ORDER — AMIODARONE HCL 200 MG PO TABS
400.0000 mg | ORAL_TABLET | Freq: Two times a day (BID) | ORAL | 1 refills | Status: DC
  Filled 2022-09-02: qty 72, 30d supply, fill #0

## 2022-09-02 MED ORDER — ASPIRIN 81 MG PO TBEC: 81.0000 mg | DELAYED_RELEASE_TABLET | Freq: Every day | ORAL | Status: AC

## 2022-09-02 MED ORDER — ATORVASTATIN CALCIUM 80 MG PO TABS
80.0000 mg | ORAL_TABLET | Freq: Every day | ORAL | 1 refills | Status: AC
  Filled 2022-09-02 – 2022-09-27 (×3): qty 30, 30d supply, fill #0

## 2022-09-02 MED ORDER — APIXABAN 5 MG PO TABS
5.0000 mg | ORAL_TABLET | Freq: Two times a day (BID) | ORAL | 1 refills | Status: AC
  Filled 2022-09-02 – 2022-09-27 (×3): qty 60, 30d supply, fill #0

## 2022-09-02 NOTE — Progress Notes (Signed)
   Heart Failure Stewardship Pharmacist Progress Note   PCP: [name], MD PCP-Cardiologist: None    HPI:  76 YO male with PMH significant for CVA, prostate cancer, CABG x3, and HFrEF. He reported chest pain and SOB, n/v after walking short distances to his PCP on 08/11/2022. On 10/12, he saw cardiology and reported chest pain and SOB progressing over 2 weeks. EKG at that time showed NSR with non-specific ST changes with inferior infarct. He presented on 10/19 for elective cardiac catheterization. Procedure showed severe three vessel CAD with total occulusion of the mid RCA, sub-total occulusion of the mid LAD, and severe segmental LV dysfunction. ECHO on 10/19 showed EF 30-35% with moderate to severe LV dysfunction and grade I diastolic dysfunction. CABG X3 on 08/24/22. Postop complicated by persistent afib, which has now converted to NSR/sinus bradycardia. Noted clearance for discharge by EP.  Current HF Medications: Beta Blocker: metoprolol succinate 50 mg QD  MRA: spironolactone 12.5 mg daily  Prior to admission HF Medications: None  Pertinent Lab Values: Serum creatinine 1.27, BUN 24, Potassium 4.2, Sodium 139, Magnesium 2.1 (10/31), A1c 5.4  Vital Signs: Weight: 189.2 lbs (admission weight: 195 lbs) Blood pressure: labile from 90/60s to 120/70s Heart rate: ~60 now in NSR  I/O: -1.5L yesterday; net -4.7L  Medication Assistance / Insurance Benefits Check: Does the patient have prescription insurance?  Yes Type of insurance plan: [employer] Medicare  Does the patient qualify for medication assistance through manufacturers or grants?   Pending Eligible grants and/or patient assistance programs: pending Medication assistance applications in progress: none  Medication assistance applications approved: none Approved medication assistance renewals will be completed by: pending  Outpatient Pharmacy:  Prior to admission outpatient pharmacy: [facility] Is the patient willing  to use [facility] pharmacy at discharge? Yes Is the patient willing to transition their outpatient pharmacy to utilize a [facility] outpatient pharmacy?   No   Assessment: 1. Acute on chronic systolic CHF (LVEF 77-82%), due to ICM. NYHA class I symptoms. - Serum creatinine stable, agree with no scheduled diuretics at discharge - Agree with reduction of metoprolol succinate ER to 50 mg daily - BP too soft for additional GDMT at this time - Consider adding Jardinace 10 mg daily at follow-up if BP permits   Plan: 1) Medication changes recommended at this time: -None  2) Patient assistance: -Copays: Delene Loll and Jardiance $45 and Farxiga $95  3)  Education  - Patient has been educated on current HF medications and potential additions to HF medication regimen - Patient verbalizes understanding that over the next few months, these medication doses may change and more medications may be added to optimize HF regimen - Patient has been educated on basic disease state pathophysiology and goals of therapy   Thank you for allowing pharmacy to participate in this patient's care.  Reatha Harps, PharmD PGY2 Pharmacy Resident 09/02/2022 8:15 AM

## 2022-09-02 NOTE — Progress Notes (Signed)
Patient has received prescription medications for discharge from the Transitions of Care Pharmacy.

## 2022-09-02 NOTE — Plan of Care (Signed)
Problem: Education: Goal: Knowledge of General Education information will improve Description: Including pain rating scale, medication(s)/side effects and non-pharmacologic comfort measures 09/02/2022 1026 by Caroll Rancher, RN Outcome: Adequate for Discharge 09/02/2022 8032 by Caroll Rancher, RN Outcome: Adequate for Discharge   Problem: Health Behavior/Discharge Planning: Goal: Ability to manage health-related needs will improve 09/02/2022 1026 by Caroll Rancher, RN Outcome: Adequate for Discharge 09/02/2022 1224 by Caroll Rancher, RN Outcome: Adequate for Discharge   Problem: Clinical Measurements: Goal: Ability to maintain clinical measurements within normal limits will improve 09/02/2022 1026 by Caroll Rancher, RN Outcome: Adequate for Discharge 09/02/2022 8250 by Caroll Rancher, RN Outcome: Adequate for Discharge Goal: Will remain free from infection 09/02/2022 1026 by Caroll Rancher, RN Outcome: Adequate for Discharge 09/02/2022 0928 by Caroll Rancher, RN Outcome: Adequate for Discharge Goal: Diagnostic test results will improve 09/02/2022 1026 by Caroll Rancher, RN Outcome: Adequate for Discharge 09/02/2022 0370 by Caroll Rancher, RN Outcome: Adequate for Discharge Goal: Respiratory complications will improve 09/02/2022 1026 by Caroll Rancher, RN Outcome: Adequate for Discharge 09/02/2022 4888 by Caroll Rancher, RN Outcome: Adequate for Discharge Goal: Cardiovascular complication will be avoided 09/02/2022 1026 by Caroll Rancher, RN Outcome: Adequate for Discharge 09/02/2022 9169 by Caroll Rancher, RN Outcome: Adequate for Discharge   Problem: Activity: Goal: Risk for activity intolerance will decrease 09/02/2022 1026 by Caroll Rancher, RN Outcome: Adequate for Discharge 09/02/2022 4503 by Caroll Rancher, RN Outcome: Adequate for Discharge    Problem: Nutrition: Goal: Adequate nutrition will be maintained 09/02/2022 1026 by Caroll Rancher, RN Outcome: Adequate for Discharge 09/02/2022 0928 by Caroll Rancher, RN Outcome: Adequate for Discharge   Problem: Coping: Goal: Level of anxiety will decrease 09/02/2022 1026 by Caroll Rancher, RN Outcome: Adequate for Discharge 09/02/2022 0928 by Caroll Rancher, RN Outcome: Adequate for Discharge   Problem: Elimination: Goal: Will not experience complications related to bowel motility 09/02/2022 1026 by Caroll Rancher, RN Outcome: Adequate for Discharge 09/02/2022 0928 by Caroll Rancher, RN Outcome: Adequate for Discharge Goal: Will not experience complications related to urinary retention 09/02/2022 1026 by Caroll Rancher, RN Outcome: Adequate for Discharge 09/02/2022 8882 by Caroll Rancher, RN Outcome: Adequate for Discharge   Problem: Elimination: Goal: Will not experience complications related to bowel motility 09/02/2022 1026 by Caroll Rancher, RN Outcome: Adequate for Discharge 09/02/2022 0928 by Caroll Rancher, RN Outcome: Adequate for Discharge Goal: Will not experience complications related to urinary retention 09/02/2022 1026 by Caroll Rancher, RN Outcome: Adequate for Discharge 09/02/2022 8003 by Caroll Rancher, RN Outcome: Adequate for Discharge   Problem: Pain Managment: Goal: General experience of comfort will improve 09/02/2022 1026 by Caroll Rancher, RN Outcome: Adequate for Discharge 09/02/2022 4917 by Caroll Rancher, RN Outcome: Adequate for Discharge   Problem: Safety: Goal: Ability to remain free from injury will improve 09/02/2022 1026 by Caroll Rancher, RN Outcome: Adequate for Discharge 09/02/2022 9150 by Caroll Rancher, RN Outcome: Adequate for Discharge   Problem: Skin Integrity: Goal: Risk for impaired skin integrity will  decrease 09/02/2022 1026 by Caroll Rancher, RN Outcome: Adequate for Discharge 09/02/2022 5697 by Caroll Rancher, RN Outcome: Adequate for Discharge   Problem: Education: Goal: Will demonstrate proper wound care and an understanding of methods to prevent future damage 09/02/2022 1026 by Caroll Rancher, RN Outcome: Adequate for Discharge 09/02/2022 9480 by Caroll Rancher, RN  Outcome: Adequate for Discharge Goal: Knowledge of disease or condition will improve 09/02/2022 1026 by Caroll Rancher, RN Outcome: Adequate for Discharge 09/02/2022 9449 by Caroll Rancher, RN Outcome: Adequate for Discharge Goal: Knowledge of the prescribed therapeutic regimen will improve 09/02/2022 1026 by Caroll Rancher, RN Outcome: Adequate for Discharge 09/02/2022 6759 by Caroll Rancher, RN Outcome: Adequate for Discharge   Problem: Activity: Goal: Risk for activity intolerance will decrease 09/02/2022 1026 by Caroll Rancher, RN Outcome: Adequate for Discharge 09/02/2022 1638 by Caroll Rancher, RN Outcome: Adequate for Discharge   Problem: Cardiac: Goal: Will achieve and/or maintain hemodynamic stability 09/02/2022 1026 by Caroll Rancher, RN Outcome: Adequate for Discharge 09/02/2022 0928 by Caroll Rancher, RN Outcome: Adequate for Discharge   Problem: Clinical Measurements: Goal: Postoperative complications will be avoided or minimized 09/02/2022 1026 by Caroll Rancher, RN Outcome: Adequate for Discharge 09/02/2022 0928 by Caroll Rancher, RN Outcome: Adequate for Discharge   Problem: Respiratory: Goal: Respiratory status will improve 09/02/2022 1026 by Caroll Rancher, RN Outcome: Adequate for Discharge 09/02/2022 0928 by Caroll Rancher, RN Outcome: Adequate for Discharge   Problem: Skin Integrity: Goal: Wound healing without signs and symptoms of infection 09/02/2022 1026 by  Caroll Rancher, RN Outcome: Adequate for Discharge 09/02/2022 0928 by Caroll Rancher, RN Outcome: Adequate for Discharge Goal: Risk for impaired skin integrity will decrease 09/02/2022 1026 by Caroll Rancher, RN Outcome: Adequate for Discharge 09/02/2022 4665 by Caroll Rancher, RN Outcome: Adequate for Discharge   Problem: Education: Goal: Knowledge of disease or condition will improve 09/02/2022 1026 by Caroll Rancher, RN Outcome: Adequate for Discharge 09/02/2022 9935 by Caroll Rancher, RN Outcome: Adequate for Discharge Goal: Understanding of medication regimen will improve 09/02/2022 1026 by Caroll Rancher, RN Outcome: Adequate for Discharge 09/02/2022 7017 by Caroll Rancher, RN Outcome: Adequate for Discharge   Problem: Cardiac: Goal: Ability to achieve and maintain adequate cardiopulmonary perfusion will improve 09/02/2022 1026 by Caroll Rancher, RN Outcome: Adequate for Discharge 09/02/2022 7939 by Caroll Rancher, RN Outcome: Adequate for Discharge   Problem: Health Behavior/Discharge Planning: Goal: Ability to safely manage health-related needs after discharge will improve 09/02/2022 1026 by Caroll Rancher, RN Outcome: Adequate for Discharge 09/02/2022 0300 by Caroll Rancher, RN Outcome: Adequate for Discharge

## 2022-09-02 NOTE — Plan of Care (Signed)
  Problem: Education: Goal: Knowledge of General Education information will improve Description: Including pain rating scale, medication(s)/side effects and non-pharmacologic comfort measures Outcome: Adequate for Discharge   Problem: Health Behavior/Discharge Planning: Goal: Ability to manage health-related needs will improve Outcome: Adequate for Discharge   Problem: Clinical Measurements: Goal: Ability to maintain clinical measurements within normal limits will improve Outcome: Adequate for Discharge Goal: Will remain free from infection Outcome: Adequate for Discharge Goal: Diagnostic test results will improve Outcome: Adequate for Discharge Goal: Respiratory complications will improve Outcome: Adequate for Discharge Goal: Cardiovascular complication will be avoided Outcome: Adequate for Discharge   Problem: Activity: Goal: Risk for activity intolerance will decrease Outcome: Adequate for Discharge   Problem: Nutrition: Goal: Adequate nutrition will be maintained Outcome: Adequate for Discharge   Problem: Coping: Goal: Level of anxiety will decrease Outcome: Adequate for Discharge   Problem: Elimination: Goal: Will not experience complications related to bowel motility Outcome: Adequate for Discharge Goal: Will not experience complications related to urinary retention Outcome: Adequate for Discharge   Problem: Pain Managment: Goal: General experience of comfort will improve Outcome: Adequate for Discharge   Problem: Safety: Goal: Ability to remain free from injury will improve Outcome: Adequate for Discharge   Problem: Skin Integrity: Goal: Risk for impaired skin integrity will decrease Outcome: Adequate for Discharge   Problem: Education: Goal: Will demonstrate proper wound care and an understanding of methods to prevent future damage Outcome: Adequate for Discharge Goal: Knowledge of disease or condition will improve Outcome: Adequate for Discharge Goal:  Knowledge of the prescribed therapeutic regimen will improve Outcome: Adequate for Discharge   Problem: Activity: Goal: Risk for activity intolerance will decrease Outcome: Adequate for Discharge   Problem: Cardiac: Goal: Will achieve and/or maintain hemodynamic stability Outcome: Adequate for Discharge   Problem: Clinical Measurements: Goal: Postoperative complications will be avoided or minimized Outcome: Adequate for Discharge   Problem: Respiratory: Goal: Respiratory status will improve Outcome: Adequate for Discharge   Problem: Skin Integrity: Goal: Wound healing without signs and symptoms of infection Outcome: Adequate for Discharge Goal: Risk for impaired skin integrity will decrease Outcome: Adequate for Discharge   Problem: Education: Goal: Knowledge of disease or condition will improve Outcome: Adequate for Discharge Goal: Understanding of medication regimen will improve Outcome: Adequate for Discharge   Problem: Cardiac: Goal: Ability to achieve and maintain adequate cardiopulmonary perfusion will improve Outcome: Adequate for Discharge   Problem: Health Behavior/Discharge Planning: Goal: Ability to safely manage health-related needs after discharge will improve Outcome: Adequate for Discharge

## 2022-09-02 NOTE — Plan of Care (Signed)
  Problem: Education: Goal: Knowledge of General Education information will improve Description: Including pain rating scale, medication(s)/side effects and non-pharmacologic comfort measures Outcome: Adequate for Discharge   Problem: Health Behavior/Discharge Planning: Goal: Ability to manage health-related needs will improve Outcome: Adequate for Discharge   Problem: Clinical Measurements: Goal: Ability to maintain clinical measurements within normal limits will improve Outcome: Adequate for Discharge Goal: Will remain free from infection Outcome: Adequate for Discharge Goal: Diagnostic test results will improve Outcome: Adequate for Discharge Goal: Respiratory complications will improve Outcome: Adequate for Discharge Goal: Cardiovascular complication will be avoided Outcome: Adequate for Discharge   Problem: Activity: Goal: Risk for activity intolerance will decrease Outcome: Adequate for Discharge   Problem: Nutrition: Goal: Adequate nutrition will be maintained Outcome: Adequate for Discharge   Problem: Coping: Goal: Level of anxiety will decrease Outcome: Adequate for Discharge   Problem: Elimination: Goal: Will not experience complications related to bowel motility Outcome: Adequate for Discharge Goal: Will not experience complications related to urinary retention Outcome: Adequate for Discharge   Problem: Pain Managment: Goal: General experience of comfort will improve Outcome: Adequate for Discharge   Problem: Safety: Goal: Ability to remain free from injury will improve Outcome: Adequate for Discharge   Problem: Skin Integrity: Goal: Risk for impaired skin integrity will decrease Outcome: Adequate for Discharge   Problem: Education: Goal: Will demonstrate proper wound care and an understanding of methods to prevent future damage Outcome: Adequate for Discharge Goal: Knowledge of disease or condition will improve Outcome: Adequate for Discharge Goal:  Knowledge of the prescribed therapeutic regimen will improve Outcome: Adequate for Discharge   Problem: Activity: Goal: Risk for activity intolerance will decrease Outcome: Adequate for Discharge   Problem: Cardiac: Goal: Will achieve and/or maintain hemodynamic stability Outcome: Adequate for Discharge   Problem: Clinical Measurements: Goal: Postoperative complications will be avoided or minimized Outcome: Adequate for Discharge   Problem: Respiratory: Goal: Respiratory status will improve Outcome: Adequate for Discharge   Problem: Education: Goal: Knowledge of disease or condition will improve Outcome: Adequate for Discharge Goal: Understanding of medication regimen will improve Outcome: Adequate for Discharge   Problem: Education: Goal: Understanding of medication regimen will improve Outcome: Adequate for Discharge   Problem: Cardiac: Goal: Ability to achieve and maintain adequate cardiopulmonary perfusion will improve Outcome: Adequate for Discharge   Problem: Health Behavior/Discharge Planning: Goal: Ability to safely manage health-related needs after discharge will improve Outcome: Adequate for Discharge

## 2022-09-02 NOTE — Care Management Important Message (Signed)
Important Message  Patient Details  Name: CHIOKE NOXON MRN: [id number] Date of Birth: Oct 25, 1946   Medicare Important Message Given:  Yes     Shelda Altes 09/02/2022, 10:23 AM

## 2022-09-02 NOTE — Progress Notes (Signed)
CARDIAC REHAB PHASE I   Pt sitting in chair feeling well today. Walked with mobility and tolerated well. Reviewed home education provided yesterday. All questions and concerns addressed. Discharge home today.   [phone]  Vanessa Barbara, RN BSN 09/02/2022 10:14 AM

## 2022-09-02 NOTE — Progress Notes (Signed)
Mobility Specialist Progress Note:   09/02/22 0911  Mobility  Activity Ambulated with assistance in hallway  Level of Assistance Standby assist, set-up cues, supervision of patient - no hands on  Assistive Device Front wheel walker  Distance Ambulated (ft) 350 ft  Activity Response Tolerated well  $Mobility charge 1 Mobility   Pt received in chair willing to participate in mobility. No complaints of pain. Left in chair with call bell in reach and all needs met.   [facility] Surveyor, mining Chat only

## 2022-09-02 NOTE — Progress Notes (Signed)
Being discharged today

## 2022-09-02 NOTE — Progress Notes (Signed)
Patient being discharged home.  Patient to be transported by his wife. IV removed with the catheter intact.  Discharge instructions given to the patient who verbalized understanding.

## 2022-09-02 NOTE — Progress Notes (Signed)
[address]       [place] [address]             [phone]     9 Days Post-Op Procedure(s) (LRB): CORONARY ARTERY BYPASS GRAFTING (CABG) x3, USING LEFT INTERNAL MAMMARY ARTERY AND ENDOSCOPICALLY HARVESTED RIGHT GREATER SAPHENOUS VEIN (N/A) TRANSESOPHAGEAL ECHOCARDIOGRAM (TEE) (N/A) Subjective: Feels ok   Objective: Vital signs in last 24 hours: Temp:  [97.5 F (36.4 C)-99 F (37.2 C)] 97.5 F (36.4 C) (11/02 0305) Pulse Rate:  [53-63] 63 (11/02 0305) Cardiac Rhythm: Normal sinus rhythm (11/01 2141) Resp:  [15-24] 20 (11/02 0305) BP: (94-127)/(49-88) 120/69 (11/02 0305) SpO2:  [92 %-100 %] 98 % (11/02 0305) Weight:  [84.4 kg] 84.4 kg (11/02 4967)  Hemodynamic parameters for last 24 hours:    Intake/Output from previous day: 11/01 0701 - 11/02 0700 In: 380 [P.O.:380] Out: 1050 [Urine:1050] Intake/Output this shift: No intake/output data recorded.  General appearance: alert, cooperative, and no distress Heart: regular rate and rhythm Lungs: clear to auscultation bilaterally Abdomen: benign Extremities: trace ankle edema Wound: healing well  Lab Results: Recent Labs    09/01/22 0353 09/02/22 0124  WBC 7.2 7.6  HGB 11.2* 10.9*  HCT 34.2* 32.6*  PLT 249 259   BMET:  Recent Labs    09/01/22 0353 09/02/22 0124  NA 139 139  K 4.8 4.2  CL 105 106  CO2 25 24  GLUCOSE 109* 117*  BUN 22 24*  CREATININE 1.28* 1.27*  CALCIUM 8.8* 8.7*    PT/INR: No results for input(s): "LABPROT", "INR" in the last 72 hours. ABG    Component Value Date/Time   PHART 7.370 08/25/2022 0132   HCO3 22.7 08/25/2022 0132   TCO2 24 08/25/2022 0132   ACIDBASEDEF 2.0 08/25/2022 0132   O2SAT 99 08/25/2022 0132   CBG (last 3)  Recent Labs    09/01/22 1559 09/01/22 2134 09/02/22 0628  GLUCAP 114* 116* 108*    Meds Scheduled Meds:  amiodarone  400 mg Oral BID   apixaban  5 mg Oral BID   aspirin EC  81 mg Oral Daily   atorvastatin  80 mg Oral  Daily   bisacodyl  10 mg Oral Daily   Or   bisacodyl  10 mg Rectal Daily   Chlorhexidine Gluconate Cloth  6 each Topical Daily   docusate sodium  200 mg Oral Daily   insulin aspart  0-24 Units Subcutaneous TID AC & HS   magnesium oxide  400 mg Oral BID   metoprolol succinate  50 mg Oral Daily   pantoprazole  40 mg Oral Daily   sodium chloride flush  3 mL Intravenous Q12H   sodium chloride flush  3 mL Intravenous Q12H   spironolactone  12.5 mg Oral Daily   Continuous Infusions:  sodium chloride     lactated ringers     PRN Meds:.sodium chloride, docusate sodium, metoprolol tartrate, ondansetron (ZOFRAN) IV, mouth rinse, oxyCODONE, polyethylene glycol, sodium chloride flush, sodium chloride flush, traMADol  Xrays No results found.  Assessment/Plan: S/P Procedure(s) (LRB): CORONARY ARTERY BYPASS GRAFTING (CABG) x3, USING LEFT INTERNAL MAMMARY ARTERY AND ENDOSCOPICALLY HARVESTED RIGHT GREATER SAPHENOUS VEIN (N/A) TRANSESOPHAGEAL ECHOCARDIOGRAM (TEE) (N/A) POD #9  1 afebrile, SBP 90s to 120s, sinus rhythm sinus bradycardia very short burst of rate controlled A-fib-EP is satisfied with current plan and has signed off, Toprol-XL reduced to once daily by them we will continue apixaban and amiodarone 2 oxygen saturations good on room  air 3 adequate urine output 4 creatinine stable at 1.27, off diuretics 5 stable for discharge     LOS: 14 days    Gaspar Bidding Pager [phone] 09/02/2022

## 2022-09-03 MED FILL — Lidocaine HCl Local Preservative Free (PF) Inj 2%: INTRAMUSCULAR | Qty: 14 | Status: AC

## 2022-09-03 MED FILL — Heparin Sodium (Porcine) Inj 1000 Unit/ML: Qty: 1000 | Status: AC

## 2022-09-03 MED FILL — Potassium Chloride Inj 2 mEq/ML: INTRAVENOUS | Qty: 40 | Status: AC

## 2022-09-08 ENCOUNTER — Telehealth (HOSPITAL_COMMUNITY): Payer: Self-pay

## 2022-09-08 ENCOUNTER — Other Ambulatory Visit (HOSPITAL_COMMUNITY): Payer: Self-pay

## 2022-09-08 NOTE — Telephone Encounter (Signed)
Pharmacy Transitions of Care Follow-up Telephone Call  Date of discharge: 09/02/22  Discharge Diagnosis: CAD s/p CABG x3  How have you been since you were released from the hospital? Doing well since discharge. Completed med rec of new, changed and stopped medications. Counseled on signs and symptoms of bleeding to watch out for when taking Eliquis. Patient requested medications be sent to [facility] mail order pharmacy.   Medication changes made at discharge: START taking these medications  START taking these medications amiodarone 200 MG tablet Commonly known as: PACERONE Take 2 tablets (400 mg total) by mouth 2 (two) times daily. For 14 days, then change to 200 mg po daily  aspirin EC 81 MG tablet Take 1 tablet (81 mg total) by mouth daily. Swallow whole.  Eliquis 5 MG Tabs tablet Generic drug: apixaban Take 1 tablet (5 mg total) by mouth 2 (two) times daily.  metoprolol succinate 50 MG 24 hr tablet Commonly known as: TOPROL-XL Take 1 tablet (50 mg total) by mouth daily. Take with or immediately following a meal.  spironolactone 25 MG tablet Commonly known as: ALDACTONE Take 1/2 tablet (12.5 mg total) by mouth daily.  traMADol 50 MG tablet Commonly known as: ULTRAM Take 1 tablet (50 mg total) by mouth every 6 (six) hours as needed for moderate pain.   CHANGE how you take these medications  CHANGE how you take these medications atorvastatin 80 MG tablet Commonly known as: LIPITOR Take 1 tablet (80 mg total) by mouth daily. What changed: medication strength how much to take when to take this   STOP taking these medications  STOP taking these medications clopidogrel 75 MG tablet Commonly known as: PLAVIX  famotidine 40 MG tablet Commonly known as: PEPCID  lisinopril 5 MG tablet Commonly known as: ZESTRIL   Medication changes verified by the patient? Yes (Yes/No)    Medication Accessibility:  Home Pharmacy: [facility] mail order   Was the patient provided with refills  on discharged medications? Yes, except tramadol   Have all prescriptions been transferred from [facility] to home pharmacy? Yes   Is the patient interested in using a [place]? No  Medication Review:  APIXABAN (ELIQUIS)  Apixaban 5 mg BID initiated on 09/02/22 for Afib.  - Discussed importance of taking medication around the same time every day.  - Advised patient of medications to avoid (NSAIDs, ASA maintenance doses>100 mg daily)  - Educated that Tylenol (acetaminophen) will be the preferred analgesic to prevent risk of bleeding. - Emphasized importance of monitoring for signs and symptoms of bleeding (abnormal bruising, prolonged bleeding, nose bleeds, bleeding from gums, discolored urine, black tarry stools)  - Advised patient to alert all providers of anticoagulation therapy prior to starting a new medication or having a procedure.  Follow-up Appointments:  PCP Hospital f/u appt confirmed? yes   Specialist Hospital f/u appt confirmed? yes Scheduled to see   09/13/2022 11:00 AM HEART VASCULAR TOC NEW 60 min  [facility] [[id number]]              09/17/2022  8:30 AM AF OFFICE VISIT 30 30 min [place] [[id number]]

## 2022-09-08 NOTE — Telephone Encounter (Signed)
Pharmacy Transitions of Care Follow-up Telephone Call   Date of discharge: 09/02/22  Discharge Diagnosis: CAD s/p CABG x3   How have you been since you were released from the hospital? Doing well since discharge. Completed med rec of new, changed and stopped medications. Counseled on signs and symptoms of bleeding to watch out for when taking Eliquis. Patient requested medications be sent to [facility] mail order pharmacy.    Medication changes made at discharge: START taking these medications   START taking these medications amiodarone 200 MG tablet Commonly known as: PACERONE Take 2 tablets (400 mg total) by mouth 2 (two) times daily. For 14 days, then change to 200 mg po daily  aspirin EC 81 MG tablet Take 1 tablet (81 mg total) by mouth daily. Swallow whole.  Eliquis 5 MG Tabs tablet Generic drug: apixaban Take 1 tablet (5 mg total) by mouth 2 (two) times daily.  metoprolol succinate 50 MG 24 hr tablet Commonly known as: TOPROL-XL Take 1 tablet (50 mg total) by mouth daily. Take with or immediately following a meal.  spironolactone 25 MG tablet Commonly known as: ALDACTONE Take 1/2 tablet (12.5 mg total) by mouth daily.  traMADol 50 MG tablet Commonly known as: ULTRAM Take 1 tablet (50 mg total) by mouth every 6 (six) hours as needed for moderate pain.    CHANGE how you take these medications   CHANGE how you take these medications atorvastatin 80 MG tablet Commonly known as: LIPITOR Take 1 tablet (80 mg total) by mouth daily. What changed: medication strength how much to take when to take this    STOP taking these medications   STOP taking these medications clopidogrel 75 MG tablet Commonly known as: PLAVIX  famotidine 40 MG tablet Commonly known as: PEPCID  lisinopril 5 MG tablet Commonly known as: ZESTRIL    Medication changes verified by the patient? Yes (Yes/No)     Medication Accessibility:   Home Pharmacy: [facility] mail order    Was the patient provided  with refills on discharged medications? Yes, except tramadol    Have all prescriptions been transferred from [facility] to home pharmacy? Yes    Is the patient interested in using a [place]? No   Medication Review:   APIXABAN (ELIQUIS)  Apixaban 5 mg BID initiated on 09/02/22 for Afib.  - Discussed importance of taking medication around the same time every day.  - Advised patient of medications to avoid (NSAIDs, ASA maintenance doses>100 mg daily)  - Educated that Tylenol (acetaminophen) will be the preferred analgesic to prevent risk of bleeding. - Emphasized importance of monitoring for signs and symptoms of bleeding (abnormal bruising, prolonged bleeding, nose bleeds, bleeding from gums, discolored urine, black tarry stools)  - Advised patient to alert all providers of anticoagulation therapy prior to starting a new medication or having a procedure.   Follow-up Appointments:   PCP Hospital f/u appt confirmed? yes    Specialist Hospital f/u appt confirmed? yes Scheduled to see   09/13/2022 11:00 AM HEART VASCULAR TOC NEW 60 min [place] [facility] [[id number]]                       09/17/2022  8:30 AM AF OFFICE VISIT 30 30 min [place] [[id number]]

## 2022-09-09 MED FILL — Sodium Bicarbonate IV Soln 8.4%: INTRAVENOUS | Qty: 50 | Status: AC

## 2022-09-09 MED FILL — Electrolyte-R (PH 7.4) Solution: INTRAVENOUS | Qty: 5000 | Status: AC

## 2022-09-09 MED FILL — Sodium Chloride IV Soln 0.9%: INTRAVENOUS | Qty: 2000 | Status: AC

## 2022-09-09 MED FILL — Calcium Chloride Inj 10%: INTRAVENOUS | Qty: 10 | Status: AC

## 2022-09-09 MED FILL — Heparin Sodium (Porcine) Inj 1000 Unit/ML: INTRAMUSCULAR | Qty: 10 | Status: AC

## 2022-09-09 MED FILL — Mannitol IV Soln 20%: INTRAVENOUS | Qty: 500 | Status: AC

## 2022-09-10 ENCOUNTER — Ambulatory Visit: Payer: Medicare HMO | Admitting: Physician Assistant

## 2022-09-13 ENCOUNTER — Telehealth (HOSPITAL_COMMUNITY): Payer: Self-pay | Admitting: *Deleted

## 2022-09-13 ENCOUNTER — Ambulatory Visit (HOSPITAL_COMMUNITY)
Admit: 2022-09-13 | Discharge: 2022-09-13 | Disposition: A | Discharge status: Home / Self Care | Payer: Medicare HMO | Attending: Cardiology | Admitting: Cardiology

## 2022-09-13 VITALS — BP 120/80 | HR 50 | Wt 189.0 lb

## 2022-09-13 DIAGNOSIS — I3481 Nonrheumatic mitral (valve) annulus calcification: Secondary | ICD-10-CM | POA: Diagnosis not present

## 2022-09-13 DIAGNOSIS — Z7901 Long term (current) use of anticoagulants: Secondary | ICD-10-CM | POA: Diagnosis not present

## 2022-09-13 DIAGNOSIS — Z79899 Other long term (current) drug therapy: Secondary | ICD-10-CM | POA: Diagnosis not present

## 2022-09-13 DIAGNOSIS — I255 Ischemic cardiomyopathy: Secondary | ICD-10-CM | POA: Diagnosis not present

## 2022-09-13 DIAGNOSIS — I25119 Atherosclerotic heart disease of native coronary artery with unspecified angina pectoris: Secondary | ICD-10-CM | POA: Insufficient documentation

## 2022-09-13 DIAGNOSIS — I5042 Chronic combined systolic (congestive) and diastolic (congestive) heart failure: Secondary | ICD-10-CM | POA: Diagnosis not present

## 2022-09-13 DIAGNOSIS — I480 Paroxysmal atrial fibrillation: Secondary | ICD-10-CM | POA: Insufficient documentation

## 2022-09-13 DIAGNOSIS — Z951 Presence of aortocoronary bypass graft: Secondary | ICD-10-CM | POA: Insufficient documentation

## 2022-09-13 DIAGNOSIS — I5022 Chronic systolic (congestive) heart failure: Secondary | ICD-10-CM | POA: Diagnosis not present

## 2022-09-13 DIAGNOSIS — I110 Hypertensive heart disease with heart failure: Secondary | ICD-10-CM | POA: Diagnosis not present

## 2022-09-13 LAB — BASIC METABOLIC PANEL
Anion gap: 10 (ref 5–15)
BUN: 37 mg/dL — ABNORMAL HIGH (ref 8–23)
CO2: 25 mmol/L (ref 22–32)
Calcium: 9.5 mg/dL (ref 8.9–10.3)
Chloride: 104 mmol/L (ref 98–111)
Creatinine, Ser: 1.22 mg/dL (ref 0.61–1.24)
GFR, Estimated: >60 mL/min (ref >60)
Glucose, Bld: 110 mg/dL — ABNORMAL HIGH (ref 70–99)
Potassium: 4.7 mmol/L (ref 3.5–5.1)
Sodium: 139 mmol/L (ref 135–145)

## 2022-09-13 LAB — BRAIN NATRIURETIC PEPTIDE: B Natriuretic Peptide: 467.2 pg/mL — ABNORMAL HIGH (ref 0.0–100.0)

## 2022-09-13 MED ORDER — LOSARTAN POTASSIUM 25 MG PO TABS: 12.5000 mg | ORAL_TABLET | Freq: Every day | ORAL | 0 refills | Status: DC

## 2022-09-13 MED ORDER — LOSARTAN POTASSIUM 25 MG PO TABS: 12.5000 mg | ORAL_TABLET | Freq: Every day | ORAL | 1 refills | Status: DC

## 2022-09-13 NOTE — Telephone Encounter (Signed)
Called to confirm Heart & Vascular Transitions of Care appointment at 11 am on 09/13/22. Patient reminded to bring all medications and pill box organizer with them. Confirmed patient has transportation. Gave directions, instructed to utilize [facility] parking.  Confirmed appointment prior to ending call.    Earnestine Leys, BSN, Clinical cytogeneticist Only

## 2022-09-13 NOTE — Progress Notes (Signed)
HEART & VASCULAR TRANSITION OF CARE CONSULT NOTE     Referring Physician: Dr. Kipp Brood  Primary Care: [name], MD  Primary Cardiologist: Dr. Marlou Porch    HPI: Referred to clinic by Dr. Kipp Brood for heart failure consultation.   76 y/o male w/ HTN and newly diagnosed multivessel CAD and systolic heart failure/ ischemic CM.   Underwent LHC on 08/19/22 for angina which showed multivessel CAD. Echo EF 30-35%. RV normal.   On 08/24/2022 he was taken the operating room at which time he underwent CABG x3 (LIMA-LAD, SVG-OM, and SVG- PDA).     Post op course c/b Afib w/ RVR, loaded w/ amiodarone and converted to NSR. Transitioned to PO amiodarone 400 mg twice daily for 2 weeks followed by 200 mg daily. Started on GDMT for HF, spiro + Toprol XL. Referred to both Afib Clinic and TOC clinic. D/c wt 188 lb.   He presents today for hospital f/u. Here w/ wife. Reports doing well. No chest pain. No significant dyspnea w/ ALDs. Wt stable since d/c. No LEE, orthopnea/PND. Denies palpitations. EKG shows SB, 49 bmp. No fatigue, syncope/ near syncope. He is scheduled to reduce amio to 200 mg daily starting tomorrow. BP 120/80 in clinic today. Has been ~315 systolic at home. Reports full med compliance. No side effects. Compliant w/ Eliquis. Denies abnormal bleeding.    Cardiac Testing   2D Echo 08/19/22  Akinesis of the distal anteroseptal and apical walls with overall moderate to severe LV dysfunction; probable early apical thrombus noted using definity; calcified aortic valve with partial fusion of noncoronary and left cusps; no significant AS by doppler. 1. Left ventricular ejection fraction, by estimation, is 30 to 35%. The left ventricle has moderate to severely decreased function. The left ventricle demonstrates regional wall motion abnormalities (see scoring diagram/findings for description). There is mild concentric left ventricular hypertrophy. Left ventricular diastolic parameters  are consistent with Grade I diastolic dysfunction (impaired relaxation). 2. 3. Right ventricular systolic function is normal. The right ventricular size is normal. The mitral valve is normal in structure. No evidence of mitral valve regurgitation. No evidence of mitral stenosis. Moderate mitral annular calcification. 4. The aortic valve is calcified. Aortic valve regurgitation is not visualized. No aortic stenosis is present. 5. Aortic dilatation noted. There is borderline dilatation of the aortic root, measuring 38 mm.  Review of Systems: [y] = yes, '[ ]'$  = no   General: Weight gain '[ ]'$ ; Weight loss '[ ]'$ ; Anorexia '[ ]'$ ; Fatigue '[ ]'$ ; Fever '[ ]'$ ; Chills '[ ]'$ ; Weakness '[ ]'$   Cardiac: Chest pain/pressure '[ ]'$ ; Resting SOB '[ ]'$ ; Exertional SOB '[ ]'$ ; Orthopnea '[ ]'$ ; Pedal Edema '[ ]'$ ; Palpitations '[ ]'$ ; Syncope '[ ]'$ ; Presyncope '[ ]'$ ; Paroxysmal nocturnal dyspnea'[ ]'$   Pulmonary: Cough '[ ]'$ ; Wheezing'[ ]'$ ; Hemoptysis'[ ]'$ ; Sputum '[ ]'$ ; Snoring '[ ]'$   GI: Vomiting'[ ]'$ ; Dysphagia'[ ]'$ ; Melena'[ ]'$ ; Hematochezia '[ ]'$ ; Heartburn'[ ]'$ ; Abdominal pain '[ ]'$ ; Constipation '[ ]'$ ; Diarrhea '[ ]'$ ; BRBPR '[ ]'$   GU: Hematuria'[ ]'$ ; Dysuria '[ ]'$ ; Nocturia'[ ]'$   Vascular: Pain in legs with walking '[ ]'$ ; Pain in feet with lying flat '[ ]'$ ; Non-healing sores '[ ]'$ ; Stroke '[ ]'$ ; TIA '[ ]'$ ; Slurred speech '[ ]'$ ;  Neuro: Headaches'[ ]'$ ; Vertigo'[ ]'$ ; Seizures'[ ]'$ ; Paresthesias'[ ]'$ ;Blurred vision '[ ]'$ ; Diplopia '[ ]'$ ; Vision changes '[ ]'$   Ortho/Skin: Arthritis '[ ]'$ ; Joint pain '[ ]'$ ; Muscle pain '[ ]'$ ; Joint swelling '[ ]'$ ; Back Pain '[ ]'$ ; Rash '[ ]'$   Psych: Depression'[ ]'$ ; Anxiety'[ ]'$   Heme: Bleeding problems '[ ]'$ ; Clotting disorders '[ ]'$ ; Anemia '[ ]'$   Endocrine: Diabetes '[ ]'$ ; Thyroid dysfunction'[ ]'$    Past Medical History:  Diagnosis Date   Arthritis    Coronary artery disease    Hypertension    Migraine    Multiple vessel coronary artery disease 08/20/2022   Presented with unstable angina 08/19/2022: Cath -> ost-prox RCA 40%, prox-mid RCA 100% (~CTO w/ L-R collaterals); Ost-Prox LAD  40% prox-midLAD 99% (appears to be subtotally occluded at major SP branch.); Prox-mid LCx 70%, OM2 70%.  LVEF 25 to 35% (moderate to severely reduced function.  Severe HK of anterior wall 15 mmHg). ==> CABG, concern for PCI of the LAD could jeopardize large SP branch. => W   Prostate cancer ([place])    Prostate cancer ([place])    with radiation   Stroke ([place]) 06/10/2013   Stroke [facility])     Current Outpatient Medications  Medication Sig Dispense Refill   acetaminophen (TYLENOL) 650 MG CR tablet Take 650 mg by mouth every 8 (eight) hours as needed for pain.     amiodarone (PACERONE) 200 MG tablet Take 2 tablets (400 mg total) by mouth 2 (two) times daily. For 14 days, then change to 200 mg po daily 72 tablet 1   apixaban (ELIQUIS) 5 MG TABS tablet Take 1 tablet (5 mg total) by mouth 2 (two) times daily. 60 tablet 1   aspirin EC 81 MG tablet Take 1 tablet (81 mg total) by mouth daily. Swallow whole.     atorvastatin (LIPITOR) 80 MG tablet Take 1 tablet (80 mg total) by mouth daily. 30 tablet 1   metoprolol succinate (TOPROL-XL) 50 MG 24 hr tablet Take 1 tablet (50 mg total) by mouth daily. Take with or immediately following a meal. 30 tablet 1   Multiple Vitamin (MULTIVITAMIN WITH MINERALS) TABS tablet Take 1 tablet by mouth daily with breakfast.     omeprazole (PRILOSEC) 40 MG capsule Take 40 mg by mouth daily with breakfast.      spironolactone (ALDACTONE) 25 MG tablet Take 1/2 tablet (12.5 mg total) by mouth daily. 30 tablet 1   traMADol (ULTRAM) 50 MG tablet Take 1 tablet (50 mg total) by mouth every 6 (six) hours as needed for moderate pain. 28 tablet 0   No current facility-administered medications for this encounter.    No Known Allergies    Social History   Socioeconomic History   Marital status: Married    Spouse name: [name]   Number of children: 1   Years of education: HS   Highest education level: Not on file  Occupational History   Occupation: Retired  Tobacco Use   Smoking  status: Never   Smokeless tobacco: Never  Vaping Use   Vaping Use: Never used  Substance and Sexual Activity   Alcohol use: Never   Drug use: Never   Sexual activity: Not on file  Other Topics Concern   Not on file  Social History Narrative   ** Merged History Encounter **       Patient is married with one child. Patient is left handed. Patient has high school education. Caffeine Use: 2 cups day   Social Determinants of Health   Financial Resource Strain: Low Risk  (08/30/2022)   Overall Financial Resource Strain (CARDIA)    Difficulty of Paying Living Expenses: Not hard at all  Food Insecurity: No Food Insecurity (08/20/2022)   Hunger Vital Sign  Worried About Charity fundraiser in the Last Year: Never true    [place] in the Last Year: Never true  Transportation Needs: No Transportation Needs (08/20/2022)   PRAPARE - Hydrologist (Medical): No    Lack of Transportation (Non-Medical): No  Physical Activity: Not on file  Stress: Not on file  Social Connections: Not on file  Intimate Partner Violence: Not At Risk (08/20/2022)   Humiliation, Afraid, Rape, and Kick questionnaire    Fear of Current or Ex-Partner: No    Emotionally Abused: No    Physically Abused: No    Sexually Abused: No      Family History  Problem Relation Age of Onset   CAD Father    Heart disease Father    Brain cancer Sister    Cancer Sister        brain    Vitals:   09/13/22 1125  BP: 120/80  Pulse: (!) 50  SpO2: 98%  Weight: 85.7 kg (189 lb)    PHYSICAL EXAM: General:  Well appearing. No respiratory difficulty HEENT: normal Neck: supple. no JVD. Carotids 2+ bilat; no bruits. No lymphadenopathy or thryomegaly appreciated. Cor: PMI nondisplaced. Regular rate & rhythm. No rubs, gallops or murmurs. Lungs: clear Abdomen: soft, nontender, nondistended. No hepatosplenomegaly. No bruits or masses. Good bowel sounds. Extremities: no cyanosis,  clubbing, rash, edema Neuro: alert & oriented x 3, cranial nerves grossly intact. moves all 4 extremities w/o difficulty. Affect pleasant.  ECG: sinus bradycardia 49 bpm    ASSESSMENT & PLAN:  1. CAD - multivessel  - s/p CABG x 3 (LIMA-LAD, SVG-OM, SVG-PDA) - stable w/o CP  - ASA 81 mg  - Atorva 80 mg  - Toprol XL 50 mg daily  - recommend cardiac rehab once cleared by CT surgery   2. Chronic Systolic Heart Failure - Echo 10/23 EF 30-35%, RV normal - ischemic CM. MVCAD s/p CABG - NYHA Class II. Euvolemic on exam - Continue GDMT titration  - Add Losartan 12.5 mg daily  - Continue Spironolactone 12.5 mg daily  - Add SGLT2i next visit (hgb A1c 5.4)  - Continue Toprol XL 50 mg daily  - Needs repeat 2D echo after GDMT optimization ~3 months. If EF remains < 30%, will need consideration for ICD  - BMP today   3. PAF - SB on EKG - reduce amiodarone to 200 mg daily, starting tomorrow, per EP recommendations  - has f/u in AFib clinic 11/17  - continue Eliquis   4. Hypertension  - controlled on current regimen - GDMT per above - check BMP today   NYHA II GDMT  Diuretic- No loop diuretic currently needed  BB- Toprol XL 50 mg daily  Ace/ARB/ARNI Losartan 12.5 mg daily  MRA Spironolactone 12.5 mg daily  SGLT2i Consider next visit     Referred to HFSW (PCP, Medications, Transportation, ETOH Abuse, Drug Abuse, Insurance, Museum/gallery curator ):  No Refer to Pharmacy:  No Refer to Home Health: No Refer to Advanced Heart Failure Clinic: No  Refer to General Cardiology: Yes   Follow up 1 more visit in [facility] clinic in 3-4 wks to help w/ med titration, then send back to gen cards for further management.

## 2022-09-13 NOTE — Patient Instructions (Signed)
EKG done today.  Labs done today. We will contact you only if your labs are abnormal.  START Losartan 12.'5mg'$  (1/2 tablet) by mouth daily.  No other medication changes were made. Please continue all current medications as prescribed.  Your physician recommends that you schedule a follow-up appointment in: 3-4 weeks   If you have any questions or concerns before your next appointment please send [place] a message through [facility] or call our office at [phone].    TO LEAVE A MESSAGE FOR THE NURSE SELECT OPTION 2, PLEASE LEAVE A MESSAGE INCLUDING: YOUR NAME DATE OF BIRTH CALL BACK NUMBER REASON FOR CALL**this is important as we prioritize the call backs  YOU WILL RECEIVE A CALL BACK THE SAME DAY AS LONG AS YOU CALL BEFORE 4:00 PM    Please be sure to bring in all your medications bottles to every appointment.

## 2022-09-14 ENCOUNTER — Other Ambulatory Visit (HOSPITAL_COMMUNITY): Payer: Self-pay

## 2022-09-14 ENCOUNTER — Telehealth (HOSPITAL_COMMUNITY): Payer: Self-pay

## 2022-09-14 DIAGNOSIS — K625 Hemorrhage of anus and rectum: Secondary | ICD-10-CM | POA: Diagnosis not present

## 2022-09-14 DIAGNOSIS — I2510 Atherosclerotic heart disease of native coronary artery without angina pectoris: Secondary | ICD-10-CM | POA: Diagnosis not present

## 2022-09-14 DIAGNOSIS — I480 Paroxysmal atrial fibrillation: Secondary | ICD-10-CM | POA: Diagnosis not present

## 2022-09-14 DIAGNOSIS — R739 Hyperglycemia, unspecified: Secondary | ICD-10-CM | POA: Diagnosis not present

## 2022-09-14 DIAGNOSIS — I10 Essential (primary) hypertension: Secondary | ICD-10-CM | POA: Diagnosis not present

## 2022-09-14 DIAGNOSIS — E7800 Pure hypercholesterolemia, unspecified: Secondary | ICD-10-CM | POA: Diagnosis not present

## 2022-09-14 DIAGNOSIS — I5042 Chronic combined systolic (congestive) and diastolic (congestive) heart failure: Secondary | ICD-10-CM

## 2022-09-14 DIAGNOSIS — Z951 Presence of aortocoronary bypass graft: Secondary | ICD-10-CM | POA: Diagnosis not present

## 2022-09-14 MED ORDER — FUROSEMIDE 20 MG PO TABS: ORAL_TABLET | ORAL | 0 refills | Status: AC

## 2022-09-14 NOTE — Telephone Encounter (Signed)
Patient aware and agreeable. Labs ordered, meds sent in

## 2022-09-16 NOTE — Progress Notes (Signed)
Primary Care Physician: [name], MD Primary Cardiologist: Dr Marlou Porch Primary Electrophysiologist: Dr Curt Bears Referring Physician: Dr Farrel Gobble is a 76 y.o. male with a history of CAD s/p CABG, HTN, CVA, prostate cancer, HFrEF, atrial fibrillation who presents for follow up in the [facility]. Patient was found to have ischemic cardiomyopathy and severe multivessel CAD. Underwent CABG x 3 (LIMA-LAD, RSVG PDA/OM) on 08/24/22. Postoperative course complicated by intermittent afib w RVR. Minimally symptomatic with palpitations. He was loaded on amiodarone and carvedilol was changed to metoprolol. Patient is on Eliquis for a CHADS2VASC score of 5.   On follow up today, patient reports that he has done well since discharge. He remains in sinus rhythm today. He is still taking amiodarone 400 mg BID. No bleeding issues on anticoagulation.   Today, he denies symptoms of palpitations, chest pain, shortness of breath, orthopnea, PND, lower extremity edema, dizziness, presyncope, syncope, snoring, daytime somnolence, bleeding, or neurologic sequela. The patient is tolerating medications without difficulties and is otherwise without complaint today.    Atrial Fibrillation Risk Factors:  he does not have symptoms or diagnosis of sleep apnea. he does not have a history of rheumatic fever.   he has a BMI of Body mass index is 26.3 kg/m.Marland Kitchen Filed Weights   09/17/22 0819  Weight: 85.5 kg    Family History  Problem Relation Age of Onset   CAD Father    Heart disease Father    Brain cancer Sister    Cancer Sister        brain     Atrial Fibrillation Management history:  Previous antiarrhythmic drugs: amiodarone  Previous cardioversions: none Previous ablations: none CHADS2VASC score: 5 Anticoagulation history: Eliquis   Past Medical History:  Diagnosis Date   Arthritis    Coronary artery disease    Hypertension    Migraine    Multiple  vessel coronary artery disease 08/20/2022   Presented with unstable angina 08/19/2022: Cath -> ost-prox RCA 40%, prox-mid RCA 100% (~CTO w/ L-R collaterals); Ost-Prox LAD 40% prox-midLAD 99% (appears to be subtotally occluded at major SP branch.); Prox-mid LCx 70%, OM2 70%.  LVEF 25 to 35% (moderate to severely reduced function.  Severe HK of anterior wall 15 mmHg). ==> CABG, concern for PCI of the LAD could jeopardize large SP branch. => W   Prostate cancer ([place])    Prostate cancer ([place])    with radiation   Stroke ([place]) 06/10/2013   Stroke [facility])    Past Surgical History:  Procedure Laterality Date   BACK SURGERY  1983   BACK SURGERY     CORONARY ARTERY BYPASS GRAFT N/A 08/24/2022   Procedure: CORONARY ARTERY BYPASS GRAFTING (CABG) x3, USING LEFT INTERNAL MAMMARY ARTERY AND ENDOSCOPICALLY HARVESTED RIGHT GREATER SAPHENOUS VEIN;  Surgeon: Lajuana Matte, MD;  Location: [facility] [place];  Service: Open Heart Surgery;  Laterality: N/A;   EYE SURGERY     LEFT HEART CATH AND CORONARY ANGIOGRAPHY N/A 08/19/2022   Procedure: LEFT HEART CATH AND CORONARY ANGIOGRAPHY;  Surgeon: Burnell Blanks, MD;  Location: [facility];  Service: Cardiovascular;  Laterality: N/A;   PROSTATE BIOPSY     TEE WITHOUT CARDIOVERSION N/A 08/24/2022   Procedure: TRANSESOPHAGEAL ECHOCARDIOGRAM (TEE);  Surgeon: Lajuana Matte, MD;  Location: [place];  Service: Open Heart Surgery;  Laterality: N/A;    Current Outpatient Medications  Medication Sig Dispense Refill   acetaminophen (TYLENOL) 650 MG CR tablet Take  650 mg by mouth every 8 (eight) hours as needed for pain.     apixaban (ELIQUIS) 5 MG TABS tablet Take 1 tablet (5 mg total) by mouth 2 (two) times daily. 60 tablet 1   aspirin EC 81 MG tablet Take 1 tablet (81 mg total) by mouth daily. Swallow whole.     atorvastatin (LIPITOR) 80 MG tablet Take 1 tablet (80 mg total) by mouth daily. 30 tablet 1   furosemide (LASIX) 20 MG tablet Take 1 tablet every  Monday, Wednesday, and Friday 45 tablet 0   losartan (COZAAR) 25 MG tablet Take 0.5 tablets (12.5 mg total) by mouth daily. 15 tablet 0   losartan (COZAAR) 25 MG tablet Take 0.5 tablets (12.5 mg total) by mouth daily. 15 tablet 1   metoprolol succinate (TOPROL-XL) 50 MG 24 hr tablet Take 1 tablet (50 mg total) by mouth daily. Take with or immediately following a meal. 30 tablet 1   Multiple Vitamin (MULTIVITAMIN WITH MINERALS) TABS tablet Take 1 tablet by mouth daily with breakfast.     nitroGLYCERIN (NITROSTAT) 0.4 MG SL tablet Place 0.4 mg under the tongue every 5 (five) minutes as needed for chest pain.     omeprazole (PRILOSEC) 40 MG capsule Take 40 mg by mouth daily with breakfast.      spironolactone (ALDACTONE) 25 MG tablet Take 1/2 tablet (12.5 mg total) by mouth daily. 30 tablet 1   traMADol (ULTRAM) 50 MG tablet Take 1 tablet (50 mg total) by mouth every 6 (six) hours as needed for moderate pain. 28 tablet 0   amiodarone (PACERONE) 200 MG tablet Take 1 tablet (200 mg total) by mouth daily. 90 tablet 1   No current facility-administered medications for this encounter.    No Known Allergies  Social History   Socioeconomic History   Marital status: Married    Spouse name: [name]   Number of children: 1   Years of education: HS   Highest education level: Not on file  Occupational History   Occupation: Retired  Tobacco Use   Smoking status: Never   Smokeless tobacco: Never  Vaping Use   Vaping Use: Never used  Substance and Sexual Activity   Alcohol use: Never   Drug use: Never   Sexual activity: Not on file  Other Topics Concern   Not on file  Social History Narrative   ** Merged History Encounter **       Patient is married with one child. Patient is left handed. Patient has high school education. Caffeine Use: 2 cups day   Social Determinants of Health   Financial Resource Strain: Low Risk  (08/30/2022)   Overall Financial Resource Strain (CARDIA)     Difficulty of Paying Living Expenses: Not hard at all  Food Insecurity: No Food Insecurity (08/20/2022)   Hunger Vital Sign    Worried About Running Out of Food in the Last Year: Never true    Ran Out of Food in the Last Year: Never true  Transportation Needs: No Transportation Needs (08/20/2022)   PRAPARE - Hydrologist (Medical): No    Lack of Transportation (Non-Medical): No  Physical Activity: Not on file  Stress: Not on file  Social Connections: Not on file  Intimate Partner Violence: Not At Risk (08/20/2022)   Humiliation, Afraid, Rape, and Kick questionnaire    Fear of Current or Ex-Partner: No    Emotionally Abused: No    Physically Abused: No    Sexually  Abused: No     ROS- All systems are reviewed and negative except as per the HPI above.  Physical Exam: Vitals:   09/17/22 0819  BP: 114/60  Pulse: (!) 48  Weight: 85.5 kg  Height: '5\' 11"'$  (1.803 m)    GEN- The patient is a well appearing elderly male, alert and oriented x 3 today.   Head- normocephalic, atraumatic Eyes-  Sclera clear, conjunctiva pink Ears- hearing intact Oropharynx- clear Neck- supple  Lungs- Clear to ausculation bilaterally, normal work of breathing Heart- Regular rate and rhythm, bradycardia, no murmurs, rubs or gallops  GI- soft, NT, ND, + BS Extremities- no clubbing, cyanosis, or edema MS- no significant deformity or atrophy Skin- no rash or lesion Psych- euthymic mood, full affect Neuro- strength and sensation are intact  Wt Readings from Last 3 Encounters:  09/17/22 85.5 kg  09/13/22 85.7 kg  09/02/22 84.4 kg    EKG today demonstrates  SB Vent. rate 48 BPM PR interval 196 ms QRS duration 104 ms QT/QTcB 500/446 ms  Echo 08/19/22 demonstrated   1. Akinesis of the distal anteroseptal and apical walls with overall  moderate to severe LV dysfunction; probable early apical thrombus noted  using definity; calcified aortic valve with partial fusion of  noncoronary  and left cusps; no significant AS by doppler.   2. Left ventricular ejection fraction, by estimation, is 30 to 35%. The  left ventricle has moderate to severely decreased function. The left  ventricle demonstrates regional wall motion abnormalities (see scoring  diagram/findings for description). There is mild concentric left ventricular hypertrophy. Left ventricular diastolic parameters are consistent with Grade I diastolic dysfunction (impaired relaxation).   3. Right ventricular systolic function is normal. The right ventricular  size is normal.   4. The mitral valve is normal in structure. No evidence of mitral valve  regurgitation. No evidence of mitral stenosis. Moderate mitral annular  calcification.   5. The aortic valve is calcified. Aortic valve regurgitation is not  visualized. No aortic stenosis is present.   6. Aortic dilatation noted. There is borderline dilatation of the aortic  root, measuring 38 mm.   Epic records are reviewed at length today  CHA2DS2-VASc Score = 5  The patient's score is based upon: CHF History: 1 HTN History: 1 Diabetes History: 0 Stroke History: 0 Vascular Disease History: 1 Age Score: 2 Gender Score: 0       ASSESSMENT AND PLAN: 1. Paroxysmal Atrial Fibrillation (ICD10:  I48.0) The patient's CHA2DS2-VASc score is 5, indicating a 7.2% annual risk of stroke.   Diagnosed postoperatively  Decrease amiodarone to 200 mg daily.  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 Could consider monitoring with ILR for recurrence if he stops anticoagulation since he was not very aware of his arrhythmia in hospital.   2. Secondary Hypercoagulable State (ICD10:  D68.69) The patient is at significant risk for stroke/thromboembolism based upon his CHA2DS2-VASc Score of 5.  Continue Apixaban (Eliquis).   3. CAD S/p CABG 08/24/22 No  anginal symptoms.  4. HTN Stable, no changes today.  5. Chronic systolic CHF EF 51-76% GDMT per primary cardiology team Fluid status appears stable.    Follow up in the [facility] clinic as scheduled.    [facility] [address] [place], [place] [address] [phone] 09/17/2022 9:12 AM

## 2022-09-17 ENCOUNTER — Ambulatory Visit (INDEPENDENT_AMBULATORY_CARE_PROVIDER_SITE_OTHER): Payer: Self-pay | Admitting: Thoracic Surgery (Cardiothoracic Vascular Surgery)

## 2022-09-17 ENCOUNTER — Ambulatory Visit (HOSPITAL_COMMUNITY)
Admit: 2022-09-17 | Discharge: 2022-09-17 | Disposition: A | Discharge status: Home / Self Care | Payer: Medicare HMO | Attending: Physician Assistant | Admitting: Physician Assistant

## 2022-09-17 ENCOUNTER — Telehealth (HOSPITAL_COMMUNITY): Payer: Self-pay

## 2022-09-17 VITALS — BP 114/60 | HR 48 | Ht 71.0 in | Wt 188.6 lb

## 2022-09-17 DIAGNOSIS — I639 Cerebral infarction, unspecified: Secondary | ICD-10-CM | POA: Diagnosis not present

## 2022-09-17 DIAGNOSIS — I2510 Atherosclerotic heart disease of native coronary artery without angina pectoris: Secondary | ICD-10-CM | POA: Insufficient documentation

## 2022-09-17 DIAGNOSIS — C7982 Secondary malignant neoplasm of genital organs: Secondary | ICD-10-CM | POA: Diagnosis not present

## 2022-09-17 DIAGNOSIS — I110 Hypertensive heart disease with heart failure: Secondary | ICD-10-CM | POA: Diagnosis not present

## 2022-09-17 DIAGNOSIS — I5022 Chronic systolic (congestive) heart failure: Secondary | ICD-10-CM | POA: Diagnosis not present

## 2022-09-17 DIAGNOSIS — Z7901 Long term (current) use of anticoagulants: Secondary | ICD-10-CM | POA: Diagnosis not present

## 2022-09-17 DIAGNOSIS — D6869 Other thrombophilia: Secondary | ICD-10-CM | POA: Diagnosis not present

## 2022-09-17 DIAGNOSIS — I480 Paroxysmal atrial fibrillation: Secondary | ICD-10-CM | POA: Diagnosis not present

## 2022-09-17 DIAGNOSIS — I4819 Other persistent atrial fibrillation: Secondary | ICD-10-CM | POA: Diagnosis not present

## 2022-09-17 DIAGNOSIS — Z951 Presence of aortocoronary bypass graft: Secondary | ICD-10-CM

## 2022-09-17 LAB — BASIC METABOLIC PANEL
Anion gap: 8 (ref 5–15)
BUN: 27 mg/dL — ABNORMAL HIGH (ref 8–23)
CO2: 25 mmol/L (ref 22–32)
Calcium: 9.3 mg/dL (ref 8.9–10.3)
Chloride: 104 mmol/L (ref 98–111)
Creatinine, Ser: 1.32 mg/dL — ABNORMAL HIGH (ref 0.61–1.24)
GFR, Estimated: 56 mL/min — ABNORMAL LOW (ref >60)
Glucose, Bld: 108 mg/dL — ABNORMAL HIGH (ref 70–99)
Potassium: 4.3 mmol/L (ref 3.5–5.1)
Sodium: 137 mmol/L (ref 135–145)

## 2022-09-17 LAB — BRAIN NATRIURETIC PEPTIDE: B Natriuretic Peptide: 343.3 pg/mL — ABNORMAL HIGH (ref 0.0–100.0)

## 2022-09-17 MED ORDER — AMIODARONE HCL 200 MG PO TABS: 200.0000 mg | ORAL_TABLET | Freq: Every day | ORAL | 1 refills | Status: DC

## 2022-09-17 NOTE — Progress Notes (Unsigned)
     [address]       [place] [address]             [phone]       Patient: Home Provider: Office Consent for Telemedicine visit obtained.  Today's visit was completed via a real-time telehealth (see specific modality noted below). The patient/authorized person provided oral consent at the time of the visit to engage in a telemedicine encounter with the present provider at [facility]. The patient/authorized person was informed of the potential benefits, limitations, and risks of telemedicine. The patient/authorized person expressed understanding that the laws that protect confidentiality also apply to telemedicine. The patient/authorized person acknowledged understanding that telemedicine does not provide emergency services and that he or she would need to call 911 or proceed to the nearest hospital for help if such a need arose.   Total time spent in the clinical discussion 10 minutes.  Telehealth Modality: Phone visit (audio only)  I had a telephone visit with  Randy Myers who is s/p CABG.  Overall doing well.  Pain is minimal.  Ambulating well. Vitals have been stable.  Randy Myers will see [place] back in 1 month with a chest x-ray for cardiac rehab clearance.  [name]

## 2022-09-17 NOTE — Patient Instructions (Signed)
Decrease amiodarone '200mg'$  once a day

## 2022-09-17 NOTE — Telephone Encounter (Signed)
Pt is not interested in the cardiac rehab program. Closed referral 

## 2022-09-22 ENCOUNTER — Other Ambulatory Visit (HOSPITAL_COMMUNITY): Payer: Self-pay

## 2022-09-22 DIAGNOSIS — Z8546 Personal history of malignant neoplasm of prostate: Secondary | ICD-10-CM | POA: Diagnosis not present

## 2022-09-27 ENCOUNTER — Other Ambulatory Visit (HOSPITAL_COMMUNITY): Payer: Self-pay

## 2022-09-30 DIAGNOSIS — Z8546 Personal history of malignant neoplasm of prostate: Secondary | ICD-10-CM | POA: Diagnosis not present

## 2022-09-30 DIAGNOSIS — R350 Frequency of micturition: Secondary | ICD-10-CM | POA: Diagnosis not present

## 2022-09-30 DIAGNOSIS — N401 Enlarged prostate with lower urinary tract symptoms: Secondary | ICD-10-CM | POA: Diagnosis not present

## 2022-10-04 ENCOUNTER — Telehealth (HOSPITAL_COMMUNITY): Payer: Self-pay

## 2022-10-04 ENCOUNTER — Ambulatory Visit (HOSPITAL_COMMUNITY)
Admission: RE | Admit: 2022-10-04 | Discharge: 2022-10-04 | Disposition: A | Discharge status: Home / Self Care | Payer: Medicare HMO | Source: Ambulatory Visit | Attending: Cardiology | Admitting: Cardiology

## 2022-10-04 ENCOUNTER — Encounter (HOSPITAL_COMMUNITY): Payer: Self-pay

## 2022-10-04 VITALS — BP 110/70 | HR 51 | Wt 192.0 lb

## 2022-10-04 DIAGNOSIS — I480 Paroxysmal atrial fibrillation: Secondary | ICD-10-CM | POA: Diagnosis not present

## 2022-10-04 DIAGNOSIS — I255 Ischemic cardiomyopathy: Secondary | ICD-10-CM | POA: Diagnosis not present

## 2022-10-04 DIAGNOSIS — I25119 Atherosclerotic heart disease of native coronary artery with unspecified angina pectoris: Secondary | ICD-10-CM | POA: Insufficient documentation

## 2022-10-04 DIAGNOSIS — Z951 Presence of aortocoronary bypass graft: Secondary | ICD-10-CM | POA: Insufficient documentation

## 2022-10-04 DIAGNOSIS — R001 Bradycardia, unspecified: Secondary | ICD-10-CM | POA: Insufficient documentation

## 2022-10-04 DIAGNOSIS — I5022 Chronic systolic (congestive) heart failure: Secondary | ICD-10-CM | POA: Insufficient documentation

## 2022-10-04 DIAGNOSIS — Z7901 Long term (current) use of anticoagulants: Secondary | ICD-10-CM | POA: Diagnosis not present

## 2022-10-04 DIAGNOSIS — I110 Hypertensive heart disease with heart failure: Secondary | ICD-10-CM | POA: Diagnosis not present

## 2022-10-04 DIAGNOSIS — I5042 Chronic combined systolic (congestive) and diastolic (congestive) heart failure: Secondary | ICD-10-CM | POA: Diagnosis not present

## 2022-10-04 DIAGNOSIS — Z79899 Other long term (current) drug therapy: Secondary | ICD-10-CM | POA: Insufficient documentation

## 2022-10-04 LAB — BASIC METABOLIC PANEL
Anion gap: 6 (ref 5–15)
BUN: 31 mg/dL — ABNORMAL HIGH (ref 8–23)
CO2: 24 mmol/L (ref 22–32)
Calcium: 8.8 mg/dL — ABNORMAL LOW (ref 8.9–10.3)
Chloride: 109 mmol/L (ref 98–111)
Creatinine, Ser: 1.1 mg/dL (ref 0.61–1.24)
GFR, Estimated: >60 mL/min (ref >60)
Glucose, Bld: 105 mg/dL — ABNORMAL HIGH (ref 70–99)
Potassium: 4 mmol/L (ref 3.5–5.1)
Sodium: 139 mmol/L (ref 135–145)

## 2022-10-04 LAB — BRAIN NATRIURETIC PEPTIDE: B Natriuretic Peptide: 383.4 pg/mL — ABNORMAL HIGH (ref 0.0–100.0)

## 2022-10-04 MED ORDER — LOSARTAN POTASSIUM 25 MG PO TABS: 25.0000 mg | ORAL_TABLET | Freq: Every day | ORAL | 2 refills | Status: DC

## 2022-10-04 NOTE — Progress Notes (Signed)
HEART & VASCULAR TRANSITION OF CARE PROGRESS NOTE     Referring Physician: Dr. Kipp Brood  Primary Care: [name], MD  Primary Cardiologist: Dr. Marlou Porch    HPI: Referred to clinic by Dr. Kipp Brood for heart failure consultation.   76 y/o male w/ HTN and newly diagnosed multivessel CAD and systolic heart failure/ ischemic CM.   Underwent LHC on 08/19/22 for angina which showed multivessel CAD. Echo EF 30-35%. RV normal.   On 08/24/2022 he was taken the operating room at which time he underwent CABG x3 (LIMA-LAD, SVG-OM, and SVG- PDA).     Post op course c/b Afib w/ RVR, loaded w/ amiodarone and converted to NSR. Transitioned to PO amiodarone 400 mg twice daily for 2 weeks followed by 200 mg daily. Started on GDMT for HF, spiro + Toprol XL. Referred to both Afib Clinic and TOC clinic. D/c wt 188 lb.   At initial [facility] visit, 11/13, he was doing fairly well. NYHA Class II. EKG showed sinus bradycardia, 49 bpm. Amiodarone was reduced down from bid to once daily. BNP was mildly elevated at 467. Lasix MWF was added to regimen as well as low dose losartan, 12.5 mg daily.  He presents back today for 2nd TOC visit to reassess volume status and to further titrate GDMT. Here w/ wife. Doing fairly well. Denies major limitations w/ basic ADLs. NYHA Class II. Denies CP. EKG shows sinus bradycardia 46 bpm but denies dizziness or fatigue. We checked HR w/ ambulation. HR increases into the 60s w/ walking. BP 110/70. Denies orthostatic symptoms.   ReDS 30%.   Has not started cardiac rehab yet.   Cardiac Testing   2D Echo 08/19/22  Akinesis of the distal anteroseptal and apical walls with overall moderate to severe LV dysfunction; probable early apical thrombus noted using definity; calcified aortic valve with partial fusion of noncoronary and left cusps; no significant AS by doppler. 1. Left ventricular ejection fraction, by estimation, is 30 to 35%. The left ventricle has moderate to  severely decreased function. The left ventricle demonstrates regional wall motion abnormalities (see scoring diagram/findings for description). There is mild concentric left ventricular hypertrophy. Left ventricular diastolic parameters are consistent with Grade I diastolic dysfunction (impaired relaxation). 2. 3. Right ventricular systolic function is normal. The right ventricular size is normal. The mitral valve is normal in structure. No evidence of mitral valve regurgitation. No evidence of mitral stenosis. Moderate mitral annular calcification. 4. The aortic valve is calcified. Aortic valve regurgitation is not visualized. No aortic stenosis is present. 5. Aortic dilatation noted. There is borderline dilatation of the aortic root, measuring 38 mm.  Review of Systems: [y] = yes, '[ ]'$  = no   General: Weight gain '[ ]'$ ; Weight loss '[ ]'$ ; Anorexia '[ ]'$ ; Fatigue '[ ]'$ ; Fever '[ ]'$ ; Chills '[ ]'$ ; Weakness '[ ]'$   Cardiac: Chest pain/pressure '[ ]'$ ; Resting SOB '[ ]'$ ; Exertional SOB '[ ]'$ ; Orthopnea '[ ]'$ ; Pedal Edema '[ ]'$ ; Palpitations '[ ]'$ ; Syncope '[ ]'$ ; Presyncope '[ ]'$ ; Paroxysmal nocturnal dyspnea'[ ]'$   Pulmonary: Cough '[ ]'$ ; Wheezing'[ ]'$ ; Hemoptysis'[ ]'$ ; Sputum '[ ]'$ ; Snoring '[ ]'$   GI: Vomiting'[ ]'$ ; Dysphagia'[ ]'$ ; Melena'[ ]'$ ; Hematochezia '[ ]'$ ; Heartburn'[ ]'$ ; Abdominal pain '[ ]'$ ; Constipation '[ ]'$ ; Diarrhea '[ ]'$ ; BRBPR '[ ]'$   GU: Hematuria'[ ]'$ ; Dysuria '[ ]'$ ; Nocturia'[ ]'$   Vascular: Pain in legs with walking '[ ]'$ ; Pain in feet with lying flat '[ ]'$ ; Non-healing sores '[ ]'$ ; Stroke '[ ]'$ ; TIA '[ ]'$ ; Slurred  speech '[ ]'$ ;  Neuro: Headaches'[ ]'$ ; Vertigo'[ ]'$ ; Seizures'[ ]'$ ; Paresthesias'[ ]'$ ;Blurred vision '[ ]'$ ; Diplopia '[ ]'$ ; Vision changes '[ ]'$   Ortho/Skin: Arthritis '[ ]'$ ; Joint pain '[ ]'$ ; Muscle pain '[ ]'$ ; Joint swelling '[ ]'$ ; Back Pain '[ ]'$ ; Rash '[ ]'$   Psych: Depression'[ ]'$ ; Anxiety'[ ]'$   Heme: Bleeding problems '[ ]'$ ; Clotting disorders '[ ]'$ ; Anemia '[ ]'$   Endocrine: Diabetes '[ ]'$ ; Thyroid dysfunction'[ ]'$    Past Medical History:  Diagnosis Date   Arthritis     Coronary artery disease    Hypertension    Migraine    Multiple vessel coronary artery disease 08/20/2022   Presented with unstable angina 08/19/2022: Cath -> ost-prox RCA 40%, prox-mid RCA 100% (~CTO w/ L-R collaterals); Ost-Prox LAD 40% prox-midLAD 99% (appears to be subtotally occluded at major SP branch.); Prox-mid LCx 70%, OM2 70%.  LVEF 25 to 35% (moderate to severely reduced function.  Severe HK of anterior wall 15 mmHg). ==> CABG, concern for PCI of the LAD could jeopardize large SP branch. => W   Prostate cancer ([place])    Prostate cancer ([place])    with radiation   Stroke ([place]) 06/10/2013   Stroke [facility])     Current Outpatient Medications  Medication Sig Dispense Refill   acetaminophen (TYLENOL) 650 MG CR tablet Take 650 mg by mouth every 8 (eight) hours as needed for pain.     amiodarone (PACERONE) 200 MG tablet Take 1 tablet (200 mg total) by mouth daily. 90 tablet 1   apixaban (ELIQUIS) 5 MG TABS tablet Take 1 tablet (5 mg total) by mouth 2 (two) times daily. 60 tablet 1   aspirin EC 81 MG tablet Take 1 tablet (81 mg total) by mouth daily. Swallow whole.     atorvastatin (LIPITOR) 80 MG tablet Take 1 tablet (80 mg total) by mouth daily. 30 tablet 1   furosemide (LASIX) 20 MG tablet Take 1 tablet every Monday, Wednesday, and Friday 45 tablet 0   losartan (COZAAR) 25 MG tablet Take 0.5 tablets (12.5 mg total) by mouth daily. 15 tablet 0   metoprolol succinate (TOPROL-XL) 50 MG 24 hr tablet Take 1 tablet (50 mg total) by mouth daily. Take with or immediately following a meal. 30 tablet 1   Multiple Vitamin (MULTIVITAMIN WITH MINERALS) TABS tablet Take 1 tablet by mouth daily with breakfast.     nitroGLYCERIN (NITROSTAT) 0.4 MG SL tablet Place 0.4 mg under the tongue every 5 (five) minutes as needed for chest pain.     omeprazole (PRILOSEC) 40 MG capsule Take 40 mg by mouth daily with breakfast.      spironolactone (ALDACTONE) 25 MG tablet Take 1/2 tablet (12.5 mg total) by mouth daily.  30 tablet 1   traMADol (ULTRAM) 50 MG tablet Take 1 tablet (50 mg total) by mouth every 6 (six) hours as needed for moderate pain. 28 tablet 0   No current facility-administered medications for this encounter.    No Known Allergies    Social History   Socioeconomic History   Marital status: Married    Spouse name: [name]   Number of children: 1   Years of education: HS   Highest education level: Not on file  Occupational History   Occupation: Retired  Tobacco Use   Smoking status: Never   Smokeless tobacco: Never  Vaping Use   Vaping Use: Never used  Substance and Sexual Activity   Alcohol use: Never   Drug use: Never   Sexual activity: Not  on file  Other Topics Concern   Not on file  Social History Narrative   ** Merged History Encounter **       Patient is married with one child. Patient is left handed. Patient has high school education. Caffeine Use: 2 cups day   Social Determinants of Health   Financial Resource Strain: Low Risk  (08/30/2022)   Overall Financial Resource Strain (CARDIA)    Difficulty of Paying Living Expenses: Not hard at all  Food Insecurity: No Food Insecurity (08/20/2022)   Hunger Vital Sign    Worried About Running Out of Food in the Last Year: Never true    Ran Out of Food in the Last Year: Never true  Transportation Needs: No Transportation Needs (08/20/2022)   PRAPARE - Hydrologist (Medical): No    Lack of Transportation (Non-Medical): No  Physical Activity: Not on file  Stress: Not on file  Social Connections: Not on file  Intimate Partner Violence: Not At Risk (08/20/2022)   Humiliation, Afraid, Rape, and Kick questionnaire    Fear of Current or Ex-Partner: No    Emotionally Abused: No    Physically Abused: No    Sexually Abused: No      Family History  Problem Relation Age of Onset   CAD Father    Heart disease Father    Brain cancer Sister    Cancer Sister        brain    Vitals:    10/04/22 1057  BP: 110/70  Pulse: (!) 51  SpO2: 98%  Weight: 87.1 kg (192 lb)     PHYSICAL EXAM: ReDs 30%  General:  Well appearing. No respiratory difficulty HEENT: normal Neck: supple. no JVD. Carotids 2+ bilat; no bruits. No lymphadenopathy or thyromegaly appreciated. Cor: PMI nondisplaced. Regular rate & rhythm. No rubs, gallops or murmurs. Lungs: clear Abdomen: soft, nontender, nondistended. No hepatosplenomegaly. No bruits or masses. Good bowel sounds. Extremities: no cyanosis, clubbing, rash, edema Neuro: alert & oriented x 3, cranial nerves grossly intact. moves all 4 extremities w/o difficulty. Affect pleasant.   ECG: sinus bradycardia 46 bpm    ASSESSMENT & PLAN:  1. CAD - multivessel  - s/p CABG x 3 (LIMA-LAD, SVG-OM, SVG-PDA) - stable w/o CP  - ASA 81 mg  - Atorva 80 mg  - Toprol XL 50 mg daily  - recommend cardiac rehab once cleared by CT surgery   2. Chronic Systolic Heart Failure - Echo 10/23 EF 30-35%, RV normal - ischemic CM. MVCAD s/p CABG - NYHA Class II. Euvolemic on exam. ReDs 30%  - Continue GDMT titration  - Increase Losartan to 25 mg daily. Eventual Entresto if BP able to tolerate  - Continue Spironolactone 12.5 mg daily  - Consider addition of SGLT2i next (hgb A1c 5.4)  - Continue Toprol XL 50 mg daily  - No Digoxin w/ bradycardia  - Needs repeat 2D echo after GDMT optimization ~3 months. If EF remains < 30%, will need consideration for ICD  - BMP/BNP today   3. PAF - SB on EKG, 46 bpm. Denies fatigue/dizziness. Increases to 60s w/ ambulation - continue amiodarone 200 mg daily. Cardiology to follow LFTs/TFTs    - continue Toprol XL 50 mg daily  - continue Eliquis 5 mg bid. Denies abnormal bleeding.   4. Hypertension  - controlled on current regimen - GDMT per above - check BMP today   NYHA II GDMT  Diuretic- Lasix 20 mg MFW  BB- Toprol XL 50 mg daily  Ace/ARB/ARNI Losartan 25 mg daily  MRA Spironolactone 12.5 mg daily  SGLT2i  Consider next visit     Referred to HFSW (PCP, Medications, Transportation, ETOH Abuse, Drug Abuse, Insurance, Financial ):  No Refer to Pharmacy:  No Refer to Home Health: No Refer to Advanced Heart Failure Clinic: No  Refer to General Cardiology: Yes   F/u w/ Gen Cardiology. Dr. Marlou Porch and team to resume care. Appt made w/ APP in 2 wks.

## 2022-10-04 NOTE — Patient Instructions (Signed)
EKG done today.  RedsClip done today.  Labs done today. We will contact you only if your labs are abnormal.  INCREASE Losartan to '25mg'$  (1 tablet) by mouth daily.   No other medication changes were made. Please continue all current medications as prescribed.  Thank you for allowing [place] to provide your heart failure care after your recent hospitalization. Please follow-up with 2-3 weeks with Dr. Marlou Porch

## 2022-10-04 NOTE — Telephone Encounter (Signed)
Heart Failure Nurse Navigator Progress Note  Called to confirm Heart & Vascular Transitions of Care appointment at 11am today. Patient reminded to bring all medications and pill box organizer with them. Confirmed patient has transportation. Gave directions, instructed to utilize [place] parking.  Confirmed appointment prior to ending call.    Pricilla Holm, MSN, RN Heart Failure Nurse Navigator

## 2022-10-14 DIAGNOSIS — H25813 Combined forms of age-related cataract, bilateral: Secondary | ICD-10-CM | POA: Diagnosis not present

## 2022-10-14 DIAGNOSIS — H35453 Secondary pigmentary degeneration, bilateral: Secondary | ICD-10-CM | POA: Diagnosis not present

## 2022-10-14 DIAGNOSIS — H353131 Nonexudative age-related macular degeneration, bilateral, early dry stage: Secondary | ICD-10-CM | POA: Diagnosis not present

## 2022-10-14 DIAGNOSIS — H35341 Macular cyst, hole, or pseudohole, right eye: Secondary | ICD-10-CM | POA: Diagnosis not present

## 2022-10-14 DIAGNOSIS — H35363 Drusen (degenerative) of macula, bilateral: Secondary | ICD-10-CM | POA: Diagnosis not present

## 2022-10-18 ENCOUNTER — Other Ambulatory Visit: Payer: Self-pay | Admitting: Thoracic Surgery (Cardiothoracic Vascular Surgery)

## 2022-10-18 DIAGNOSIS — Z951 Presence of aortocoronary bypass graft: Secondary | ICD-10-CM

## 2022-10-20 ENCOUNTER — Ambulatory Visit
Admission: RE | Admit: 2022-10-20 | Discharge: 2022-10-20 | Disposition: A | Discharge status: Home / Self Care | Payer: Medicare HMO | Source: Ambulatory Visit | Attending: Thoracic Surgery (Cardiothoracic Vascular Surgery) | Admitting: Thoracic Surgery (Cardiothoracic Vascular Surgery)

## 2022-10-20 ENCOUNTER — Ambulatory Visit (INDEPENDENT_AMBULATORY_CARE_PROVIDER_SITE_OTHER): Payer: Self-pay | Admitting: Physician Assistant

## 2022-10-20 VITALS — BP 124/66 | HR 62 | Resp 20 | Ht 71.0 in | Wt 198.0 lb

## 2022-10-20 DIAGNOSIS — Z951 Presence of aortocoronary bypass graft: Secondary | ICD-10-CM

## 2022-10-20 NOTE — Patient Instructions (Signed)
Continue to avoid any heavy lifting or strenuous use of your arms or shoulders for at least a total of three months from the time of surgery.  After three months you may gradually increase how much you lift or otherwise use your arms or chest as tolerated, with limits based upon whether or not activities lead to the return of significant discomfort.   You may return to driving an automobile as long as you are no longer requiring oral narcotic pain relievers during the daytime.  It would be wise to start driving only short distances during the daylight and gradually increase from there as you feel comfortable.  You are encouraged to enroll and participate in the outpatient cardiac rehab program beginning as soon as practical.

## 2022-10-20 NOTE — Progress Notes (Unsigned)
Office Visit    Patient Name: Randy Myers Date of Encounter: 10/20/2022  Primary Care Provider:  [name], MD Primary Cardiologist:  None Primary Electrophysiologist: None  Chief Complaint    Randy Myers is a 76 y.o. male with PMH of CAD s/p CABG x 3, HTN, ICM, HFrEF, prostate CA s/p radiation treatment, CVA who presents today for post CABG follow-up.  Past Medical History    Past Medical History:  Diagnosis Date   Arthritis    Coronary artery disease    Hypertension    Migraine    Multiple vessel coronary artery disease 08/20/2022   Presented with unstable angina 08/19/2022: Cath -> ost-prox RCA 40%, prox-mid RCA 100% (~CTO w/ L-R collaterals); Ost-Prox LAD 40% prox-midLAD 99% (appears to be subtotally occluded at major SP branch.); Prox-mid LCx 70%, OM2 70%.  LVEF 25 to 35% (moderate to severely reduced function.  Severe HK of anterior wall 15 mmHg). ==> CABG, concern for PCI of the LAD could jeopardize large SP branch. => W   Prostate cancer ([place])    Prostate cancer ([place])    with radiation   Stroke ([place]) 06/10/2013   Stroke [facility])    Past Surgical History:  Procedure Laterality Date   BACK SURGERY  1983   BACK SURGERY     CORONARY ARTERY BYPASS GRAFT N/A 08/24/2022   Procedure: CORONARY ARTERY BYPASS GRAFTING (CABG) x3, USING LEFT INTERNAL MAMMARY ARTERY AND ENDOSCOPICALLY HARVESTED RIGHT GREATER SAPHENOUS VEIN;  Surgeon: Randy Matte, MD;  Location: [facility] [facility];  Service: Open Heart Surgery;  Laterality: N/A;   EYE SURGERY     LEFT HEART CATH AND CORONARY ANGIOGRAPHY N/A 08/19/2022   Procedure: LEFT HEART CATH AND CORONARY ANGIOGRAPHY;  Surgeon: Randy Blanks, MD;  Location: [facility];  Service: Cardiovascular;  Laterality: N/A;   PROSTATE BIOPSY     TEE WITHOUT CARDIOVERSION N/A 08/24/2022   Procedure: TRANSESOPHAGEAL ECHOCARDIOGRAM (TEE);  Surgeon: Randy Matte, MD;  Location: [place];  Service: Open Heart Surgery;  Laterality:  N/A;    Allergies  No Known Allergies  History of Present Illness    Randy Myers  is a 76 year old male with the above mention past medical history who presents today for post CABG follow-up.  [name] was first seen on 08/2022 by Dr. Marlou Myers for episodic chest pressure associated with shortness of breath.  Reports that chest pain had worsened over previous 2 weeks.  He notes chest pain with ambulation and exertion.  EKG completed demonstrating poor R wave progression subtle ST elevation.  He was set up for [facility] that was completed by Dr. Angelena Myers on 10/19 that revealed multivessel CAD and EF of 25 as 35%.  He was sent for consult by CVTS who recommended CABG.  He underwent procedure by Dr. Kipp Myers on 10/24 with CABG x 3 utilizing LIMA-LAD, saphenous to OM and saphenous to PDA.  He developed postop A-fib and was placed on amiodarone.  EP consulted due to difficult management of A-fib and patient was placed on Eliquis with eventual chemical cardioversion.  He was transitioned to p.o. amiodarone 40 mg twice daily followed by 200 mg daily.  He was last seen in the advanced heart failure clinic for [facility] visit by Randy Henri, PA.  During visit he reported doing well and maintaining sinus rhythm with amiodarone 20 mg daily.   Since last being seen in the office patient reports***.  Patient denies chest pain, palpitations, dyspnea,  PND, orthopnea, nausea, vomiting, dizziness, syncope, edema, weight gain, or early satiety.     ***Notes:  Home Medications    Current Outpatient Medications  Medication Sig Dispense Refill   acetaminophen (TYLENOL) 650 MG CR tablet Take 650 mg by mouth every 8 (eight) hours as needed for pain.     amiodarone (PACERONE) 200 MG tablet Take 1 tablet (200 mg total) by mouth daily. 90 tablet 1   apixaban (ELIQUIS) 5 MG TABS tablet Take 1 tablet (5 mg total) by mouth 2 (two) times daily. 60 tablet 1   aspirin EC 81 MG tablet Take 1 tablet (81 mg total) by mouth  daily. Swallow whole.     atorvastatin (LIPITOR) 80 MG tablet Take 1 tablet (80 mg total) by mouth daily. 30 tablet 1   furosemide (LASIX) 20 MG tablet Take 1 tablet every Monday, Wednesday, and Friday 45 tablet 0   losartan (COZAAR) 25 MG tablet Take 1 tablet (25 mg total) by mouth daily. 30 tablet 2   metoprolol succinate (TOPROL-XL) 50 MG 24 hr tablet Take 1 tablet (50 mg total) by mouth daily. Take with or immediately following a meal. 30 tablet 1   Multiple Vitamin (MULTIVITAMIN WITH MINERALS) TABS tablet Take 1 tablet by mouth daily with breakfast.     nitroGLYCERIN (NITROSTAT) 0.4 MG SL tablet Place 0.4 mg under the tongue every 5 (five) minutes as needed for chest pain.     omeprazole (PRILOSEC) 40 MG capsule Take 40 mg by mouth daily with breakfast.      spironolactone (ALDACTONE) 25 MG tablet Take 1/2 tablet (12.5 mg total) by mouth daily. 30 tablet 1   traMADol (ULTRAM) 50 MG tablet Take 1 tablet (50 mg total) by mouth every 6 (six) hours as needed for moderate pain. 28 tablet 0   No current facility-administered medications for this visit.     Review of Systems  Please see the history of present illness.    (+)*** (+)***  All other systems reviewed and are otherwise negative except as noted above.  Physical Exam    Wt Readings from Last 3 Encounters:  10/04/22 192 lb (87.1 kg)  09/17/22 188 lb 9.6 oz (85.5 kg)  09/13/22 189 lb (85.7 kg)   UR:KYHCW were no vitals filed for this visit.,There is no height or weight on file to calculate BMI.  Constitutional:      Appearance: Healthy appearance. Not in distress.  Neck:     Vascular: JVD normal.  Pulmonary:     Effort: Pulmonary effort is normal.     Breath sounds: No wheezing. No rales. Diminished in the bases Cardiovascular:     Normal rate. Regular rhythm. Normal S1. Normal S2.      Murmurs: There is no murmur.  Edema:    Peripheral edema absent.  Abdominal:     Palpations: Abdomen is soft non tender. There is no  hepatomegaly.  Skin:    General: Skin is warm and dry.  Neurological:     General: No focal deficit present.     Mental Status: Alert and oriented to person, place and time.     Cranial Nerves: Cranial nerves are intact.  EKG/LABS/Other Studies Reviewed    ECG personally reviewed by me today - ***  Risk Assessment/Calculations:   {Does this patient have ATRIAL FIBRILLATION?:[phone]}        Lab Results  Component Value Date   WBC 7.6 09/02/2022   HGB 10.9 (L) 09/02/2022   HCT 32.6 (L)  09/02/2022   MCV 91.1 09/02/2022   PLT 259 09/02/2022   Lab Results  Component Value Date   CREATININE 1.10 10/04/2022   BUN 31 (H) 10/04/2022   NA 139 10/04/2022   K 4.0 10/04/2022   CL 109 10/04/2022   CO2 24 10/04/2022   Lab Results  Component Value Date   ALT 16 09/14/2019   AST 39 09/14/2019   ALKPHOS 47 09/14/2019   BILITOT 0.7 09/14/2019   Lab Results  Component Value Date   CHOL 132 08/20/2022   HDL 44 08/20/2022   LDLCALC 76 08/20/2022   TRIG 62 08/20/2022   CHOLHDL 3.0 08/20/2022    Lab Results  Component Value Date   HGBA1C 5.4 08/20/2022    Assessment & Plan    1.  Coronary artery disease  2.  Persistent atrial fibrillation  3.  HFpEF  4.  Essential hypertension  5.  Hyperlipidemia      Disposition: Follow-up with None or APP in *** months {Are you ordering a CV Procedure (e.g. stress test, cath, DCCV, TEE, etc)?   Press F2        :[id number]}   Medication Adjustments/Labs and Tests Ordered: Current medicines are reviewed at length with the patient today.  Concerns regarding medicines are outlined above.   Signed, Mable Fill, Marissa Nestle, NP 10/20/2022, 1:11 PM [facility]  Note:  This document was prepared using Dragon voice recognition software and may include unintentional dictation errors.

## 2022-10-20 NOTE — Progress Notes (Addendum)
Randy Myers       Randy Myers,Randy Myers [id number]             [phone]       HPI: Patient returns for routine postoperative follow-up having undergone CABG x 3 on 08/24/22 by Dr. Kipp Brood. The patient's early postoperative recovery while in the hospital was notable for atrial fibrillation with RVR requiring cardioversion. He was discharged in normal sinus rhythm. Since hospital discharge the patient reports minimal chest soreness and some shortness of breath when walking outside but that has been improving. He denies palpitations, dizziness, fatigue and LOC. He does not feel he has had any further atrial fibrillation. He has been seen by cardiology twice since surgery and has been in sinus bradycardia both visits.   Current Outpatient Medications  Medication Sig Dispense Refill   acetaminophen (TYLENOL) 650 MG CR tablet Take 650 mg by mouth every 8 (eight) hours as needed for pain.     amiodarone (PACERONE) 200 MG tablet Take 1 tablet (200 mg total) by mouth daily. 90 tablet 1   apixaban (ELIQUIS) 5 MG TABS tablet Take 1 tablet (5 mg total) by mouth 2 (two) times daily. 60 tablet 1   aspirin EC 81 MG tablet Take 1 tablet (81 mg total) by mouth daily. Swallow whole.     atorvastatin (LIPITOR) 80 MG tablet Take 1 tablet (80 mg total) by mouth daily. 30 tablet 1   furosemide (LASIX) 20 MG tablet Take 1 tablet every Monday, Wednesday, and Friday 45 tablet 0   losartan (COZAAR) 25 MG tablet Take 1 tablet (25 mg total) by mouth daily. 30 tablet 2   metoprolol succinate (TOPROL-XL) 50 MG 24 hr tablet Take 1 tablet (50 mg total) by mouth daily. Take with or immediately following a meal. 30 tablet 1   Multiple Vitamin (MULTIVITAMIN WITH MINERALS) TABS tablet Take 1 tablet by mouth daily with breakfast.     nitroGLYCERIN (NITROSTAT) 0.4 MG SL tablet Place 0.4 mg under the tongue every 5 (five) minutes as needed for chest pain.     omeprazole (PRILOSEC) 40 MG capsule Take 40 mg by  mouth daily with breakfast.      spironolactone (ALDACTONE) 25 MG tablet Take 1/2 tablet (12.5 mg total) by mouth daily. 30 tablet 1   traMADol (ULTRAM) 50 MG tablet Take 1 tablet (50 mg total) by mouth every 6 (six) hours as needed for moderate pain. 28 tablet 0   No current facility-administered medications for this visit.   Vitals: Vitals:   10/20/22 1520  BP: 124/66  Pulse: 62  Resp: 20  SpO2: 96%    Physical Exam: General: Alert, no distress Neuro: Grossly intact CV: Regular rate and rhythm, no murmur Pulm: Clear to auscultation bilaterally Extremities: No edema  Diagnostic Tests: CLINICAL DATA:  Provided history: History of CABG.   EXAM: CHEST - 2 VIEW   COMPARISON:  Prior chest radiographs 08/28/2022 and earlier.   FINDINGS: Prior median sternotomy/CABG. Heart size within normal limits. Aortic atherosclerosis. No appreciable airspace consolidation or pulmonary edema. No evidence of pleural effusion or pneumothorax. Redemonstrated chronic left-sided rib fracture deformities. Degenerative changes of the spine with thoracic dextrocurvature.   IMPRESSION: No evidence of acute cardiopulmonary abnormality.   Aortic Atherosclerosis (ICD10-I70.0).     Electronically Signed   By: Kellie Simmering D.O.   On: 10/20/2022 15:13  Impression: [name] is progressing exceptionally well from CABG x 3. He is no longer taking pain medication  and he is walking daily. He admits to minimal shortness of breath when walking outside but it has been improving over time. His CXR shows no acute cardiopulmonary disease including atelectasis and pleural effusion and wires are intact. His incisions are healing well. I discussed continued sternal precautions with slow progression for the next month as the sternum takes 3 months to fully heal. He has started driving and has tolerated it well. I encouraged him to enroll in cardiac rehab but he feels he is progressing well on his own. We will plan  to have him return to the clinic as needed.   Magdalene River, PA-C [facility] [phone]

## 2022-10-21 ENCOUNTER — Ambulatory Visit: Payer: Medicare HMO | Attending: Nurse Practitioner | Admitting: Nurse Practitioner

## 2022-10-21 ENCOUNTER — Encounter: Payer: Self-pay | Admitting: Nurse Practitioner

## 2022-10-21 VITALS — BP 110/56 | HR 48 | Ht 72.0 in | Wt 200.0 lb

## 2022-10-21 DIAGNOSIS — I10 Essential (primary) hypertension: Secondary | ICD-10-CM

## 2022-10-21 DIAGNOSIS — E785 Hyperlipidemia, unspecified: Secondary | ICD-10-CM

## 2022-10-21 DIAGNOSIS — I25810 Atherosclerosis of coronary artery bypass graft(s) without angina pectoris: Secondary | ICD-10-CM

## 2022-10-21 DIAGNOSIS — I4819 Other persistent atrial fibrillation: Secondary | ICD-10-CM

## 2022-10-21 DIAGNOSIS — I5042 Chronic combined systolic (congestive) and diastolic (congestive) heart failure: Secondary | ICD-10-CM

## 2022-10-21 MED ORDER — METOPROLOL SUCCINATE ER 25 MG PO TB24: 25.0000 mg | ORAL_TABLET | Freq: Every day | ORAL | 1 refills | Status: AC

## 2022-10-21 NOTE — Patient Instructions (Addendum)
Medication Instructions:  DECREASE Metoprolol to '25mg'$  take 1 tablet once a day *If you need a refill on your cardiac medications before your next appointment, please call your pharmacy*   Lab Work: None ordered  Testing/Procedures: None ordered   Follow-Up: At [facility], you and your health needs are our priority.  As part of our continuing mission to provide you with exceptional heart care, we have created designated Provider Care Teams.  These Care Teams include your primary Cardiologist (physician) and Advanced Practice Providers (APPs -  Physician Assistants and Nurse Practitioners) who all work together to provide you with the care you need, when you need it.  We recommend signing up for the patient portal called "MyChart".  Sign up information is provided on this After Visit Summary.  MyChart is used to connect with patients for Virtual Visits (Telemedicine).  Patients are able to view lab/test results, encounter notes, upcoming appointments, etc.  Non-urgent messages can be sent to your provider as well.   To learn more about what you can do with MyChart, go to [email or web address].    Your next appointment:   1 month(s)  The format for your next appointment:   In Person  Provider:   Jerline Pain, MD  Other Instructions   Important Information About Sugar

## 2022-12-07 DIAGNOSIS — K625 Hemorrhage of anus and rectum: Secondary | ICD-10-CM | POA: Diagnosis not present

## 2022-12-08 ENCOUNTER — Ambulatory Visit: Payer: Medicare HMO | Attending: Cardiology | Admitting: Cardiology

## 2022-12-08 ENCOUNTER — Encounter: Payer: Self-pay | Admitting: Cardiology

## 2022-12-08 VITALS — BP 118/68 | HR 54 | Ht 72.0 in | Wt 208.0 lb

## 2022-12-08 DIAGNOSIS — I5042 Chronic combined systolic (congestive) and diastolic (congestive) heart failure: Secondary | ICD-10-CM | POA: Diagnosis not present

## 2022-12-08 DIAGNOSIS — Z79899 Other long term (current) drug therapy: Secondary | ICD-10-CM

## 2022-12-08 DIAGNOSIS — I10 Essential (primary) hypertension: Secondary | ICD-10-CM

## 2022-12-08 DIAGNOSIS — I4819 Other persistent atrial fibrillation: Secondary | ICD-10-CM

## 2022-12-08 DIAGNOSIS — I25810 Atherosclerosis of coronary artery bypass graft(s) without angina pectoris: Secondary | ICD-10-CM | POA: Diagnosis not present

## 2022-12-08 LAB — LIPID PANEL
Chol/HDL Ratio: 2.6 ratio (ref 0.0–5.0)
Cholesterol, Total: 116 mg/dL (ref 100–199)
HDL: 44 mg/dL (ref >39)
LDL Chol Calc (NIH): 55 mg/dL (ref 0–99)
Triglycerides: 86 mg/dL (ref 0–149)
VLDL Cholesterol Cal: 17 mg/dL (ref 5–40)

## 2022-12-08 LAB — BASIC METABOLIC PANEL
BUN/Creatinine Ratio: 25 — ABNORMAL HIGH (ref 10–24)
BUN: 27 mg/dL (ref 8–27)
CO2: 24 mmol/L (ref 20–29)
Calcium: 9.3 mg/dL (ref 8.6–10.2)
Chloride: 107 mmol/L — ABNORMAL HIGH (ref 96–106)
Creatinine, Ser: 1.08 mg/dL (ref 0.76–1.27)
Glucose: 114 mg/dL — ABNORMAL HIGH (ref 70–99)
Potassium: 4.8 mmol/L (ref 3.5–5.2)
Sodium: 144 mmol/L (ref 134–144)
eGFR: 71 mL/min/{1.73_m2} (ref >59)

## 2022-12-08 NOTE — Patient Instructions (Addendum)
Medication Instructions:  Please discontinue your Amiodarone. Continue all other medications as listed.  *If you need a refill on your cardiac medications before your next appointment, please call your pharmacy*  Lab Work: Please have blood work today (BMP, Lipid)  If you have labs (blood work) drawn today and your tests are completely normal, you will receive your results only by: MyChart Message (if you have MyChart) OR A paper copy in the mail If you have any lab test that is abnormal or we need to change your treatment, we will call you to review the results.  Testing/Procedures: Your physician has requested that you have an echocardiogram. Echocardiography is a painless test that uses sound waves to create images of your heart. It provides your doctor with information about the size and shape of your heart and how well your heart's chambers and valves are working. This procedure takes approximately one hour. There are no restrictions for this procedure. Please do NOT wear cologne, perfume, aftershave, or lotions (deodorant is allowed). Please arrive 15 minutes prior to your appointment time.  Follow-Up: At [facility], you and your health needs are our priority.  As part of our continuing mission to provide you with exceptional heart care, we have created designated Provider Care Teams.  These Care Teams include your primary Cardiologist (physician) and Advanced Practice Providers (APPs -  Physician Assistants and Nurse Practitioners) who all work together to provide you with the care you need, when you need it.  We recommend signing up for the patient portal called "MyChart".  Sign up information is provided on this After Visit Summary.  MyChart is used to connect with patients for Virtual Visits (Telemedicine).  Patients are able to view lab/test results, encounter notes, upcoming appointments, etc.  Non-urgent messages can be sent to your provider as well.   To learn more  about what you can do with MyChart, go to [email or web address].    Your next appointment:   6 month(s)  Provider:   With a PA or NP and Dr Candee Furbish  in 1 year.  Thank you for choosing [place]!!

## 2022-12-08 NOTE — Progress Notes (Signed)
Cardiology Office Note:    Date:  12/08/2022   ID:  EMERSYN Myers, DOB 1946/07/28, MRN [id number]  PCP:  Jonathon Bellows, [facility] Providers Cardiologist:  Candee Furbish, MD     Referring MD: [name], MD   History of Present Illness:    Randy Myers is a 77 y.o. male here for CAD s/p CABG x 3, HTN, ICM, HFrEF, prostate CA s/p radiation treatment, CVA who presents today for post CABG follow-up.   LHC that was completed by Dr. Angelena Form on 08/19/22 that revealed multivessel CAD and EF of 25 as 35%. He was sent for consult by CVTS who recommended CABG. He underwent procedure by Dr. Kipp Brood on 10/24 with CABG x 3 utilizing LIMA-LAD, saphenous to OM and saphenous to PDA. He developed postop A-fib and was placed on amiodarone. EP consulted due to difficult management of A-fib and patient was placed on Eliquis with eventual chemical cardioversion. He was transitioned to p.o. amiodarone 400 mg twice daily followed by 200 mg daily. He was last seen in the advanced heart failure clinic for [facility] visit by Ellen Henri, PA. During visit he reported doing well and maintaining sinus rhythm with amiodarone 200 mg daily. Denies any chest pain or shortness of breath since procedure   Been doing very well.  No significant shortness of breath.  No chest pain.  No bleeding.  Awaiting colonoscopy.  He was seen by Dr. [name] on 08/11/2022 and reported episodic chest pressure associated with shortness of breath, nausea, and vomiting after walking short distances. It was also noted that he had a prior fall in 10/2019 resulting in multiple rib fractures and ongoing residual chest pain. He was referred to cardiology to rule out angina due to ASCVD. It was also noted he had hypertension and hyperlipidemia; at home his blood pressures were 90-118/50-60s on lisinopril 5 mg. LDL on 06/2021 was 65 on 10 mg atorvastatin.  Prior CVA 06/2014, followed by Dr. Leonie Man in neurology. Residual symptoms included  inability to detect hot or cold on the right side of his body. He had been maintained on Plavix 75 mg to help prevent recurrent strokes.  Prior prostate cancer s/p radiation treatment 08/2017. He is on hormonal therapy for ongoing treatment.  Wife, Randy Myers. He complains of chest pain and shortness of breath that has significantly worsened in the last 2 weeks.   Prior symptoms previous to CABG: Prior discomfort he mentions the episodes of chest pain and shortness of breath are intermittent: he has good days and bad days. His chest pain can sometimes become so severe that it causes retching. His latest episode was last Monday where he was unable to sleep due to the pain.   Sometimes while walking to take out the trash he has trouble making it back to his house due to the shortness of breath. He noted that walking from the parking lot to the office today was enough to cause him to be short of breath.   In addition, he complains of intermittent LE edema.  His wife notes that he tends to want to stay active with hard work outside although she encourages him to slow down and rest.    He has no history of smoking.  He denies any palpitations. No lightheadedness, headaches, syncope, orthopnea, or PND.  In his family, his father died of a possible heart attack; he also had prostate cancer.   Past Medical History:  Diagnosis Date   Arthritis  Coronary artery disease    Hypertension    Migraine    Multiple vessel coronary artery disease 08/20/2022   Presented with unstable angina 08/19/2022: Cath -> ost-prox RCA 40%, prox-mid RCA 100% (~CTO w/ L-R collaterals); Ost-Prox LAD 40% prox-midLAD 99% (appears to be subtotally occluded at major SP branch.); Prox-mid LCx 70%, OM2 70%.  LVEF 25 to 35% (moderate to severely reduced function.  Severe HK of anterior wall 15 mmHg). ==> CABG, concern for PCI of the LAD could jeopardize large SP branch. => W   Prostate cancer ([place])    Prostate cancer ([place])     with radiation   Stroke ([place]) 06/10/2013   Stroke [facility])     Past Surgical History:  Procedure Laterality Date   BACK SURGERY  1983   BACK SURGERY     CORONARY ARTERY BYPASS GRAFT N/A 08/24/2022   Procedure: CORONARY ARTERY BYPASS GRAFTING (CABG) x3, USING LEFT INTERNAL MAMMARY ARTERY AND ENDOSCOPICALLY HARVESTED RIGHT GREATER SAPHENOUS VEIN;  Surgeon: Lajuana Matte, MD;  Location: [facility];  Service: Open Heart Surgery;  Laterality: N/A;   EYE SURGERY     LEFT HEART CATH AND CORONARY ANGIOGRAPHY N/A 08/19/2022   Procedure: LEFT HEART CATH AND CORONARY ANGIOGRAPHY;  Surgeon: Burnell Blanks, MD;  Location: [place] CV LAB;  Service: Cardiovascular;  Laterality: N/A;   PROSTATE BIOPSY     TEE WITHOUT CARDIOVERSION N/A 08/24/2022   Procedure: TRANSESOPHAGEAL ECHOCARDIOGRAM (TEE);  Surgeon: Lajuana Matte, MD;  Location: [place];  Service: Open Heart Surgery;  Laterality: N/A;    Current Medications: Current Meds  Medication Sig   acetaminophen (TYLENOL) 650 MG CR tablet Take 650 mg by mouth every 8 (eight) hours as needed for pain.   apixaban (ELIQUIS) 5 MG TABS tablet Take 1 tablet (5 mg total) by mouth 2 (two) times daily.   aspirin EC 81 MG tablet Take 1 tablet (81 mg total) by mouth daily. Swallow whole.   atorvastatin (LIPITOR) 80 MG tablet Take 1 tablet (80 mg total) by mouth daily.   furosemide (LASIX) 20 MG tablet Take 1 tablet every Monday, Wednesday, and Friday   losartan (COZAAR) 25 MG tablet Take 1 tablet (25 mg total) by mouth daily.   metoprolol succinate (TOPROL-XL) 25 MG 24 hr tablet Take 1 tablet (25 mg total) by mouth daily. Take with or immediately following a meal.   Multiple Vitamin (MULTIVITAMIN WITH MINERALS) TABS tablet Take 1 tablet by mouth daily with breakfast.   nitroGLYCERIN (NITROSTAT) 0.4 MG SL tablet Place 0.4 mg under the tongue every 5 (five) minutes as needed for chest pain.   omeprazole (PRILOSEC) 40 MG capsule Take 40 mg by mouth  daily with breakfast.    spironolactone (ALDACTONE) 25 MG tablet Take 1/2 tablet (12.5 mg total) by mouth daily.   traMADol (ULTRAM) 50 MG tablet Take 1 tablet (50 mg total) by mouth every 6 (six) hours as needed for moderate pain.   [DISCONTINUED] amiodarone (PACERONE) 200 MG tablet Take 1 tablet (200 mg total) by mouth daily.     Allergies:   Patient has no known allergies.   Social History   Socioeconomic History   Marital status: Married    Spouse name: Randy Myers   Number of children: 1   Years of education: HS   Highest education level: Not on file  Occupational History   Occupation: Retired  Tobacco Use   Smoking status: Never   Smokeless tobacco: Never  Vaping Use   Vaping  Use: Never used  Substance and Sexual Activity   Alcohol use: Never   Drug use: Never   Sexual activity: Not on file  Other Topics Concern   Not on file  Social History Narrative   ** Merged History Encounter **       Patient is married with one child. Patient is left handed. Patient has high school education. Caffeine Use: 2 cups day   Social Determinants of Health   Financial Resource Strain: Low Risk  (08/30/2022)   Overall Financial Resource Strain (CARDIA)    Difficulty of Paying Living Expenses: Not hard at all  Food Insecurity: No Food Insecurity (08/20/2022)   Hunger Vital Sign    Worried About Running Out of Food in the Last Year: Never true    Ran Out of Food in the Last Year: Never true  Transportation Needs: No Transportation Needs (08/20/2022)   PRAPARE - Hydrologist (Medical): No    Lack of Transportation (Non-Medical): No  Physical Activity: Not on file  Stress: Not on file  Social Connections: Not on file     Family History: The patient's family history includes Brain cancer in his sister; CAD in his father; Cancer in his sister; Heart disease in his father.  ROS:   Please see the history of present illness.    (+) Chest pain  (+)  Shortness of breath (+) Emesis (+) LE edema All other systems reviewed and are negative.  EKGs/Labs/Other Studies Reviewed:    The following studies were reviewed today:  Cath 08/19/22 Severe three vessel CAD Sub-total occlusion of the mid LAD just after a large septal branch that supplies collaterals to the occluded RCA Severe mid Circumflex stenosis Chronic total occlusion of the mid RCA. The mid and distal vessel fills briskly from left to right collaterals.  Severe segmental LV dysfunction with hypokinesis of the anterior wall. LVEDP 15 mmHg   Recommendations: I think the best strategy for revascularization will be bypass surgery.  EKG:  EKG is personally reviewed and interpreted. 12/08/2022-sinus bradycardia 49 PR interval 206 ms with left anterior fascicular block nonspecific T wave changes T wave inversion noted in 1 and aVL. 08/12/2022: Sinus rhythm. Rate 73 bpm. Nonspecific ST changes. Inferior infarct pattern and poor R wave progression    Recent Labs: 08/31/2022: Magnesium 2.1 09/02/2022: Hemoglobin 10.9; Platelets 259 10/04/2022: B Natriuretic Peptide 383.4; BUN 31; Creatinine, Ser 1.10; Potassium 4.0; Sodium 139   Recent Lipid Panel    Component Value Date/Time   CHOL 132 08/20/2022 0300   TRIG 62 08/20/2022 0300   HDL 44 08/20/2022 0300   CHOLHDL 3.0 08/20/2022 0300   VLDL 12 08/20/2022 0300   LDLCALC 76 08/20/2022 0300     Risk Assessment/Calculations:          Physical Exam:    VS:  BP 118/68   Pulse (!) 54   Ht 6' (1.829 m)   Wt 208 lb (94.3 kg)   SpO2 98%   BMI 28.21 kg/m     Wt Readings from Last 3 Encounters:  12/08/22 208 lb (94.3 kg)  10/21/22 200 lb (90.7 kg)  10/20/22 198 lb (89.8 kg)     GEN: Well nourished, well developed in no acute distress HEENT: Normal NECK: No JVD; No carotid bruits LYMPHATICS: No lymphadenopathy CARDIAC: RRR, no murmurs, rubs, gallops RESPIRATORY:  Clear to auscultation without rales, wheezing or rhonchi   ABDOMEN: Soft, non-tender, non-distended MUSCULOSKELETAL: No significant edema; No deformity  SKIN: Warm and dry NEUROLOGIC:  Alert and oriented x 3 PSYCHIATRIC:  Normal affect   ASSESSMENT:    1. Essential hypertension   2. Coronary artery disease involving autologous vein coronary bypass graft without angina pectoris   3. Persistent atrial fibrillation ([place])   4. Chronic combined systolic and diastolic congestive heart failure ([place])   5. Medication management     PLAN:    In order of problems listed above:  1.  Coronary artery disease: -CABG x 3 utilizing LIMA-LAD, saphenous to OM and saphenous to PDA -Reports no chest pain or angina sternotomy incision is clean and dry with no evidence of infection.  Doing very well. -Last visit reduced metoprolol XL to 25 mg due to bradycardia. -Continue current GDMT with Lipitor 80 mg daily, Toprol XL 25 mg  -Patient declines cardiac rehab at this time but does walk with his wife 45 minutes/day.   2.  Persistent atrial fibrillation: -Patient is sinus bradycardia 49 -We will go ahead and stop the amiodarone 200 mg daily.  Hopefully the atrial fibrillation was surrounding the surgery only. -Patient is currently on Eliquis 5 mg twice daily -Current dose appropriate based on age and creatinine of 1.1 -CHA2DS2-VASc Score = 5 [CHF History: 1, HTN History: 1, Diabetes History: 0, Stroke History: 0, Vascular Disease History: 1, Age Score: 2, Gender Score: 0].  Therefore, the patient's annual risk of stroke is 7.2 %.       3.  HFpEF: -Most recent 2D echo with an EF of 30-35% with anterior septal apical akinesis moderate to severe LV dysfunction.  It has been over 3 months since revascularization.  We are going to repeat echocardiogram to see if pump function is improved. -Patient is euvolemic on exam with trace lower extremity edema present.  Continuing with salt restriction fluid restriction. -Continue GDMT with spironolactone 12.5 mg daily,  losartan 25 mg daily, Toprol 25 mg daily, and Lasix 20 mg daily   4.  Essential hypertension: -Patient's blood pressure today was 118 /56 -Continue losartan and Toprol-XL   5.  Hyperlipidemia: -Patient's last LDL cholesterol was 76 on 10/23 -Continue atorvastatin 80 mg daily  6.  Preop cardiac evaluation -He may proceed with colonoscopy with low overall cardiac risk at this point. -He may hold his Eliquis for 2 days prior to colonoscopy.      Follow-up:   Medication Adjustments/Labs and Tests Ordered: Current medicines are reviewed at length with the patient today.  Concerns regarding medicines are outlined above.   Orders Placed This Encounter  Procedures   Basic metabolic panel   Lipid panel   EKG 12-Lead   ECHOCARDIOGRAM COMPLETE   No orders of the defined types were placed in this encounter.  Patient Instructions  Medication Instructions:  Please discontinue your Amiodarone. Continue all other medications as listed.  *If you need a refill on your cardiac medications before your next appointment, please call your pharmacy*  Lab Work: Please have blood work today (BMP, Lipid)  If you have labs (blood work) drawn today and your tests are completely normal, you will receive your results only by: MyChart Message (if you have MyChart) OR A paper copy in the mail If you have any lab test that is abnormal or we need to change your treatment, we will call you to review the results.  Testing/Procedures: Your physician has requested that you have an echocardiogram. Echocardiography is a painless test that uses sound waves to create images of your heart. It provides your  doctor with information about the size and shape of your heart and how well your heart's chambers and valves are working. This procedure takes approximately one hour. There are no restrictions for this procedure. Please do NOT wear cologne, perfume, aftershave, or lotions (deodorant is allowed). Please arrive  15 minutes prior to your appointment time.  Follow-Up: At [facility], you and your health needs are our priority.  As part of our continuing mission to provide you with exceptional heart care, we have created designated Provider Care Teams.  These Care Teams include your primary Cardiologist (physician) and Advanced Practice Providers (APPs -  Physician Assistants and Nurse Practitioners) who all work together to provide you with the care you need, when you need it.  We recommend signing up for the patient portal called "MyChart".  Sign up information is provided on this After Visit Summary.  MyChart is used to connect with patients for Virtual Visits (Telemedicine).  Patients are able to view lab/test results, encounter notes, upcoming appointments, etc.  Non-urgent messages can be sent to your provider as well.   To learn more about what you can do with MyChart, go to [email or web address].    Your next appointment:   6 month(s)  Provider:   With a PA or NP and Dr Candee Furbish  in 1 year.  Thank you for choosing [facility]!!        I,Rachel Rivera,acting as a scribe for Candee Furbish, MD.,have documented all relevant documentation on the behalf of Candee Furbish, MD,as directed by  Candee Furbish, MD while in the presence of Candee Furbish, MD.  I, Candee Furbish, MD, have reviewed all documentation for this visit. The documentation on 12/08/22 for the exam, diagnosis, procedures, and orders are all accurate and complete.   Signed, Candee Furbish, MD  12/08/2022 11:11 AM    [place]

## 2022-12-23 ENCOUNTER — Other Ambulatory Visit (HOSPITAL_COMMUNITY): Payer: Self-pay | Admitting: Cardiology

## 2023-01-06 ENCOUNTER — Ambulatory Visit (HOSPITAL_COMMUNITY): Payer: Medicare HMO | Attending: Internal Medicine

## 2023-01-06 DIAGNOSIS — I4819 Other persistent atrial fibrillation: Secondary | ICD-10-CM | POA: Diagnosis not present

## 2023-01-06 DIAGNOSIS — I5042 Chronic combined systolic (congestive) and diastolic (congestive) heart failure: Secondary | ICD-10-CM | POA: Insufficient documentation

## 2023-01-06 LAB — ECHOCARDIOGRAM COMPLETE
AR max vel: 2.32 cm2
AV Area VTI: 2.34 cm2
AV Area mean vel: 2.25 cm2
AV Mean grad: 9.8 mmHg
AV Peak grad: 18.7 mmHg
Ao pk vel: 2.16 m/s
Area-P 1/2: 2.76 cm2
Calc EF: 52.5 %
S' Lateral: 3.3 cm
Single Plane A2C EF: 61.6 %
Single Plane A4C EF: 49.5 %

## 2023-02-15 DIAGNOSIS — E7800 Pure hypercholesterolemia, unspecified: Secondary | ICD-10-CM | POA: Diagnosis not present

## 2023-02-15 DIAGNOSIS — K219 Gastro-esophageal reflux disease without esophagitis: Secondary | ICD-10-CM | POA: Diagnosis not present

## 2023-02-15 DIAGNOSIS — K625 Hemorrhage of anus and rectum: Secondary | ICD-10-CM | POA: Diagnosis not present

## 2023-02-15 DIAGNOSIS — M2042 Other hammer toe(s) (acquired), left foot: Secondary | ICD-10-CM | POA: Diagnosis not present

## 2023-02-15 DIAGNOSIS — I10 Essential (primary) hypertension: Secondary | ICD-10-CM | POA: Diagnosis not present

## 2023-04-13 DIAGNOSIS — H25813 Combined forms of age-related cataract, bilateral: Secondary | ICD-10-CM | POA: Diagnosis not present

## 2023-04-13 DIAGNOSIS — H35363 Drusen (degenerative) of macula, bilateral: Secondary | ICD-10-CM | POA: Diagnosis not present

## 2023-04-13 DIAGNOSIS — H43811 Vitreous degeneration, right eye: Secondary | ICD-10-CM | POA: Diagnosis not present

## 2023-04-13 DIAGNOSIS — H526 Other disorders of refraction: Secondary | ICD-10-CM | POA: Diagnosis not present

## 2023-04-13 DIAGNOSIS — H353131 Nonexudative age-related macular degeneration, bilateral, early dry stage: Secondary | ICD-10-CM | POA: Diagnosis not present

## 2023-04-13 DIAGNOSIS — H35453 Secondary pigmentary degeneration, bilateral: Secondary | ICD-10-CM | POA: Diagnosis not present

## 2023-04-22 ENCOUNTER — Other Ambulatory Visit: Payer: Self-pay | Admitting: Internal Medicine

## 2023-05-20 ENCOUNTER — Other Ambulatory Visit (HOSPITAL_COMMUNITY): Payer: Self-pay | Admitting: Cardiology

## 2023-05-27 NOTE — Progress Notes (Signed)
Cardiology Office Note:  .   Date:  06/06/2023  ID:  Randy Myers, DOB Jul 07, 1946, MRN [id number] PCP: Deatra James, MD  [facility] Providers Cardiologist:  Donato Schultz, MD    Patient Profile: .      PMH: CAD S/p CABG x 3 08/24/22 (LIMA-LAD, SVG-OM, SVG-PDA) Hypertension Hyperlipidemia Ischemic cardiomyopathy/Chronic HFrEF LVEF 25-35%, severe HK anterior wall at time of cath 08/19/22 Improvement on TTE 01/06/2023 to mildly reduced LVEF 50 to 55% Atrial fibrillation Developed post CABG Prostate cancer S/p XRT On hormone therapy CVA 06/2014 followed by Dr. 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 Anne Fu.  He reported episodic chest pain associated with shortness of breath, nausea, and vomiting after walking short distances.  He had known history of hyperlipidemia and hypertension with LDL 65 on 06/2021 on 10 mg atorvastatin.  History of CVA 06/2014.  He had continued Plavix 75 mg daily.  He reported episodes of chest pain and shortness of breath are intermittent, not as bad on some days.  Sometimes pain is so severe that it causes retching.  He had difficulty making it back to the house after taking out the trash on a recent occasion.  He also complained of intermittent LE edema.  EKG revealed poor R wave progression as well as inferior infarct pattern and subtle ST elevation in the early precordial leads.  He was scheduled for cardiac catheterization  Cardiac cath 08/19/2022 revealed severe three-vessel CAD with subtotal occlusion of the mid LAD just after a large septal branch that supplies collaterals to the occluded RCA, severe mid circumflex stenosis, chronic total occlusion of mid RCA with mid and distal vessel filling briskly from L>R collaterals.  He was referred to cardiac surgery and underwent three-vessel bypass on 08/24/2022.  He had postop A-fib  RVR and was started on Eliquis 5 mg twice daily for stroke prevention. Plavix was discontinued.  Most recent cardiology visit was with Dr. Anne Fu on 12/08/22.  He was advised to stop amiodarone due to sinus bradycardia with HR 49 bpm. Eliquis continued for stroke prevention. He did not have any concerning cardiac symptoms. Echocardiogram was repeated for evaluation of LVEF since being on GDMT x 3 months and revealed near normal LVEF 50 to 55%, mildly dilated ascending aorta at 41 mm and dilatation of aortic root at 39 mm. LDL on lipid panel 12/08/22 was 55, well controlled. He was advised to return in 6 months for follow-up.        History of Present Illness: .   Randy Myers is a very pleasant 77 y.o. male who is here today for 6 month follow-up. He is accompanied by his wife and is feeling well. Walks with a cane to ensure stability due to chronic right leg weakness. Reports significant diaphoresis since heart surgery, has to change shirts several times per day. Continues to get short of breath with moderate exertion, for example at the top of a hill. Has been active at home outside in his yard - chopping, sawing, raking. No change in activity tolerance. He denies chest pain, dyspnea, orthopnea, PND.  Has bilateral lower extremity edema at times which usually improves with leg elevation. Has colonoscopy pending due to bright red blood when wiping. This has not occurred in approximately one month. Home SBP 109-130 mmHg and DBP 50s to 60s mmHg. HR 50s to 70s bpm.  He denies presyncope or syncope.   ROS: See HPI       Studies Reviewed: .         Risk Assessment/Calculations:    CHA2DS2-VASc Score = 5   This indicates a 7.2% annual risk of stroke. The patient's score is based upon: CHF History: 1 HTN History: 1 Diabetes History: 0 Stroke History: 0 Vascular Disease History: 1 Age Score: 2 Gender Score: 0            Physical Exam:   VS:  BP 110/68   Pulse 68   Ht 6' (1.829 m)   Wt 214  lb (97.1 kg)   SpO2 97%   BMI 29.02 kg/m    Wt Readings from Last 3 Encounters:  06/06/23 214 lb (97.1 kg)  12/08/22 208 lb (94.3 kg)  10/21/22 200 lb (90.7 kg)    GEN: Well nourished, well developed in no acute distress NECK: No JVD; No carotid bruits CARDIAC: RRR, no murmurs, rubs, gallops RESPIRATORY:  Clear to auscultation without rales, wheezing or rhonchi  ABDOMEN: Soft, non-tender, non-distended EXTREMITIES:  No edema; No deformity     ASSESSMENT AND PLAN: .    CAD without angina: S/p CABG x 3 08/2022. He denies chest pain, dyspnea, or other symptoms concerning for angina.  No indication for further ischemic evaluation at this time.  He had some bright red blood on toilet paper, last occurrence about one month ago. Is scheduled for colonoscopy soon.  Cholesterol is well controlled. He remains physically active and eats a healthy diet. Continue GDMT including aspirin, atorvastatin, losartan, metoprolol.   Persistent atrial fibrillation on chronic anticoagulation: Clinically appears to be maintaining sinus rhythm today. HR  is well controlled. Rare palpitations.Eliquis 5 mg twice daily is appropriate dose for stroke prevention for CHA2DS2-VASc score of 5.  Recommendation from Pharm.D. to hold Eliquis for 1 day for upcoming colonoscopy due to history of stroke. Continue metoprolol for rate control.  Hyperlipidemia LDL goal < 55: LDL 55 on 12/08/22.  He eats a healthy diet and remains physically active.  Continue atorvastatin.  Hypertension: BP is well controlled. He does not have any symptoms of orthostasis.   HFmrEF/ICM: LVEF mildly reduced at 50-55% on echo 01/06/23. Has mild bilateral LE edema at times, not visible today, which improves with leg elevation. He has shortness of breath that occurs with moderate to heavy exertion. He feels this is stable. Weight is stable. Appears euvolemic on exam. Continue low sodium diet. We will continue GDMT including furosemide, losartan, metoprolol,  spironolactone.  Diaphoresis: He reports profuse diaphoresis since CABG. We discussed possible side effect of medications, difficult to discern. We will check TSH today. Recommended he discuss with PCP for further recommendations.   Thoracic aortic dilatation: Mildly dilated aortic root 39 mm, mild dilatation of ascending aorta 41 mm on echo 01/06/2023.  Aneurysm precautions reviewed. Plan to repeat imaging in 1 year.       Dispo: 6 months with Dr. Anne Fu  Signed, Eligha Bridegroom, NP-C

## 2023-06-03 ENCOUNTER — Other Ambulatory Visit: Payer: Self-pay

## 2023-06-03 ENCOUNTER — Encounter (HOSPITAL_COMMUNITY): Payer: Self-pay | Admitting: Internal Medicine

## 2023-06-06 ENCOUNTER — Encounter: Payer: Self-pay | Admitting: Nurse Practitioner

## 2023-06-06 ENCOUNTER — Ambulatory Visit: Payer: Medicare HMO | Attending: Nurse Practitioner | Admitting: Nurse Practitioner

## 2023-06-06 VITALS — BP 110/68 | HR 68 | Ht 72.0 in | Wt 214.0 lb

## 2023-06-06 DIAGNOSIS — R61 Generalized hyperhidrosis: Secondary | ICD-10-CM

## 2023-06-06 DIAGNOSIS — I4819 Other persistent atrial fibrillation: Secondary | ICD-10-CM

## 2023-06-06 DIAGNOSIS — I255 Ischemic cardiomyopathy: Secondary | ICD-10-CM

## 2023-06-06 DIAGNOSIS — Z7901 Long term (current) use of anticoagulants: Secondary | ICD-10-CM

## 2023-06-06 DIAGNOSIS — I77819 Aortic ectasia, unspecified site: Secondary | ICD-10-CM

## 2023-06-06 DIAGNOSIS — I10 Essential (primary) hypertension: Secondary | ICD-10-CM

## 2023-06-06 DIAGNOSIS — Z79899 Other long term (current) drug therapy: Secondary | ICD-10-CM | POA: Diagnosis not present

## 2023-06-06 DIAGNOSIS — I5042 Chronic combined systolic (congestive) and diastolic (congestive) heart failure: Secondary | ICD-10-CM | POA: Diagnosis not present

## 2023-06-06 DIAGNOSIS — I25810 Atherosclerosis of coronary artery bypass graft(s) without angina pectoris: Secondary | ICD-10-CM | POA: Diagnosis not present

## 2023-06-06 DIAGNOSIS — E785 Hyperlipidemia, unspecified: Secondary | ICD-10-CM

## 2023-06-06 NOTE — Patient Instructions (Addendum)
Medication Instructions:   Your physician recommends that you continue on your current medications as directed. Please refer to the Current Medication list given to you today.   *If you need a refill on your cardiac medications before your next appointment, please call your pharmacy*   Lab Work:  TODAY!!! TSH  If you have labs (blood work) drawn today and your tests are completely normal, you will receive your results only by: MyChart Message (if you have MyChart) OR A paper copy in the mail If you have any lab test that is abnormal or we need to change your treatment, we will call you to review the results.   Testing/Procedures:  None ordered.   Follow-Up: At [facility], you and your health needs are our priority.  As part of our continuing mission to provide you with exceptional heart care, we have created designated Provider Care Teams.  These Care Teams include your primary Cardiologist (physician) and Advanced Practice Providers (APPs -  Physician Assistants and Nurse Practitioners) who all work together to provide you with the care you need, when you need it.  We recommend signing up for the patient portal called "MyChart".  Sign up information is provided on this After Visit Summary.  MyChart is used to connect with patients for Virtual Visits (Telemedicine).  Patients are able to view lab/test results, encounter notes, upcoming appointments, etc.  Non-urgent messages can be sent to your provider as well.   To learn more about what you can do with MyChart, go to [email or web address].    Your next appointment:   6 month(s)  Provider:   Donato Schultz, MD     Other Instructions  Your physician wants you to follow-up in: 6 months.  You will receive a reminder letter in the mail two months in advance. If you don't receive a letter, please call our office to schedule the follow-up appointment.   One of your tests has shown an aneurysm of your Thoracic Aorta. . The  word "aneurysm" refers to a bulge in an artery (blood vessel). Most people think of them in the context of an emergency, but yours was found incidentally. At this point there is nothing you need to do from a procedure standpoint, but there are some important things to keep in mind for day-to-day life.  Mainstays of therapy for aneurysms include very good blood pressure control, healthy lifestyle, and avoiding tobacco products and street drugs. Research has raised concern that antibiotics in the fluoroquinolone class could be associated with increased risk of having an aneurysm develop or tear. This includes medicines that end in "floxacin," like Cipro or Levaquin. Make sure to discuss this information with other healthcare providers if you require antibiotics.  Since aneurysms can run in families, you should discuss your diagnosis with first degree relatives as they may need to be screened for this. Regular mild-moderate physical exercise is important, but avoid heavy lifting/weight lifting over 30lbs, chopping wood, shoveling snow or digging heavy earth with a shovel. It is best to avoid activities that cause grunting or straining (medically referred to as a "Valsalva maneuver"). This happens when a person bears down against a closed throat to increase the strength of arm or abdominal muscles. There's often a tendency to do this when lifting heavy weights, doing sit-ups, push-ups or chin-ups, etc., but it may be harmful.  This is a finding I would expect to be monitored periodically by your cardiology team. Most unruptured thoracic aortic aneurysms cause no symptoms, so  they are often found during exams for other conditions. Contact a health care provider if you develop any discomfort in your upper back, neck, abdomen, trouble swallowing, cough or hoarseness, or unexplained weight loss. Get help right away if you develop severe pain in your upper back or abdomen that may move into your chest and arms, or any  other concerning symptoms such as shortness of breath or fever.

## 2023-06-09 ENCOUNTER — Ambulatory Visit: Payer: Medicare HMO | Admitting: Cardiology

## 2023-06-13 NOTE — Anesthesia Preprocedure Evaluation (Signed)
Anesthesia Evaluation  Patient identified by MRN, date of birth, ID band Patient awake    Reviewed: Allergy & Precautions, NPO status , Patient's Chart, lab work & pertinent test results  Airway Mallampati: II  TM Distance: >3 FB Neck ROM: Full    Dental no notable dental hx. (+) Upper Dentures, Lower Dentures   Pulmonary    Pulmonary exam normal breath sounds clear to auscultation       Cardiovascular hypertension, Pt. on medications and Pt. on home beta blockers + CAD, + CABG (08/2022) and + Peripheral Vascular Disease  Normal cardiovascular exam(-) dysrhythmias (on eliquis stopped 8/10) Atrial Fibrillation  Rhythm:Regular Rate:Normal  12/2021 Echo  1. Left ventricular ejection fraction, by estimation, is 50 to 55%. Left  ventricular ejection fraction by 2D MOD biplane is 52.5 %. The left  ventricle has low normal function. The left ventricle demonstrates global  hypokinesis. Left ventricular  diastolic parameters are consistent with Grade I diastolic dysfunction  (impaired relaxation).   2. Right ventricular systolic function is mildly reduced. The right  ventricular size is normal. There is normal pulmonary artery systolic  pressure. The estimated right ventricular systolic pressure is 23.7 mmHg.   3. Left atrial size was severely dilated.   4. The mitral valve is abnormal. Trivial mitral valve regurgitation.   5. The aortic valve is calcified. There is moderate calcification of the  aortic valve. Aortic valve regurgitation is trivial. Aortic valve  sclerosis/calcification is present, without any evidence of aortic  stenosis. Aortic valve area, by VTI measures  2.34 cm. Aortic valve mean gradient measures 9.8 mmHg. Aortic valve Vmax  measures 2.16 m/s.   6. Aortic dilatation noted. There is borderline dilatation of the aortic  root, measuring 39 mm. There is mild dilatation of the ascending aorta,  measuring 41 mm.   7.  The inferior vena cava is normal in size with <50% respiratory  variability, suggesting right atrial pressure of 8 mmHg.      Neuro/Psych  Headaches CVA (decreased temp sensation on R), Residual Symptoms    GI/Hepatic ,GERD  Medicated and Controlled,,  Endo/Other    Renal/GU      Musculoskeletal  (+) Arthritis , Osteoarthritis,    Abdominal   Peds  Hematology   Anesthesia Other Findings   Reproductive/Obstetrics                             Anesthesia Physical Anesthesia Plan  ASA: 3  Anesthesia Plan: MAC   Post-op Pain Management:    Induction: Intravenous  PONV Risk Score and Plan: 1 and Propofol infusion and Treatment may vary due to age or medical condition  Airway Management Planned: Nasal Cannula and Natural Airway  Additional Equipment: None  Intra-op Plan:   Post-operative Plan:   Informed Consent: I have reviewed the patients History and Physical, chart, labs and discussed the procedure including the risks, benefits and alternatives for the proposed anesthesia with the patient or authorized representative who has indicated his/her understanding and acceptance.     Dental advisory given  Plan Discussed with: CRNA  Anesthesia Plan Comments: (Blood per rectum for colonoscopy)        Anesthesia Quick Evaluation

## 2023-06-14 ENCOUNTER — Ambulatory Visit (HOSPITAL_COMMUNITY)
Admission: RE | Admit: 2023-06-14 | Discharge: 2023-06-14 | Disposition: A | Discharge status: Home / Self Care | Payer: Medicare HMO | Source: Home / Self Care | Attending: Internal Medicine | Admitting: Internal Medicine

## 2023-06-14 ENCOUNTER — Ambulatory Visit (HOSPITAL_COMMUNITY): Payer: Medicare HMO | Admitting: Anesthesiology

## 2023-06-14 ENCOUNTER — Encounter (HOSPITAL_COMMUNITY)
Admission: RE | Disposition: A | Discharge status: Home / Self Care | Payer: Self-pay | Source: Home / Self Care | Attending: Internal Medicine

## 2023-06-14 ENCOUNTER — Encounter (HOSPITAL_COMMUNITY): Payer: Self-pay | Admitting: Internal Medicine

## 2023-06-14 ENCOUNTER — Ambulatory Visit (HOSPITAL_BASED_OUTPATIENT_CLINIC_OR_DEPARTMENT_OTHER): Payer: Medicare HMO | Admitting: Anesthesiology

## 2023-06-14 ENCOUNTER — Other Ambulatory Visit: Payer: Self-pay

## 2023-06-14 DIAGNOSIS — K5730 Diverticulosis of large intestine without perforation or abscess without bleeding: Secondary | ICD-10-CM

## 2023-06-14 DIAGNOSIS — I480 Paroxysmal atrial fibrillation: Secondary | ICD-10-CM | POA: Insufficient documentation

## 2023-06-14 DIAGNOSIS — K625 Hemorrhage of anus and rectum: Secondary | ICD-10-CM | POA: Insufficient documentation

## 2023-06-14 DIAGNOSIS — Z7901 Long term (current) use of anticoagulants: Secondary | ICD-10-CM | POA: Diagnosis not present

## 2023-06-14 DIAGNOSIS — Z8673 Personal history of transient ischemic attack (TIA), and cerebral infarction without residual deficits: Secondary | ICD-10-CM | POA: Diagnosis not present

## 2023-06-14 DIAGNOSIS — D125 Benign neoplasm of sigmoid colon: Secondary | ICD-10-CM | POA: Diagnosis not present

## 2023-06-14 DIAGNOSIS — Z951 Presence of aortocoronary bypass graft: Secondary | ICD-10-CM | POA: Diagnosis not present

## 2023-06-14 DIAGNOSIS — D126 Benign neoplasm of colon, unspecified: Secondary | ICD-10-CM | POA: Diagnosis not present

## 2023-06-14 DIAGNOSIS — K640 First degree hemorrhoids: Secondary | ICD-10-CM

## 2023-06-14 DIAGNOSIS — D121 Benign neoplasm of appendix: Secondary | ICD-10-CM | POA: Insufficient documentation

## 2023-06-14 DIAGNOSIS — I4819 Other persistent atrial fibrillation: Secondary | ICD-10-CM | POA: Diagnosis not present

## 2023-06-14 DIAGNOSIS — I2510 Atherosclerotic heart disease of native coronary artery without angina pectoris: Secondary | ICD-10-CM | POA: Diagnosis not present

## 2023-06-14 DIAGNOSIS — Z79899 Other long term (current) drug therapy: Secondary | ICD-10-CM | POA: Diagnosis not present

## 2023-06-14 DIAGNOSIS — K219 Gastro-esophageal reflux disease without esophagitis: Secondary | ICD-10-CM | POA: Insufficient documentation

## 2023-06-14 DIAGNOSIS — D120 Benign neoplasm of cecum: Secondary | ICD-10-CM | POA: Insufficient documentation

## 2023-06-14 HISTORY — DX: Gastro-esophageal reflux disease without esophagitis: K21.9

## 2023-06-14 HISTORY — PX: POLYPECTOMY: SHX5525

## 2023-06-14 HISTORY — PX: COLONOSCOPY WITH PROPOFOL: SHX5780

## 2023-06-14 SURGERY — COLONOSCOPY WITH PROPOFOL
Anesthesia: Monitor Anesthesia Care

## 2023-06-14 MED ORDER — PROPOFOL 1000 MG/100ML IV EMUL
INTRAVENOUS | Status: AC
  Filled 2023-06-14: qty 100

## 2023-06-14 MED ORDER — LACTATED RINGERS IV SOLN: INTRAVENOUS | Status: DC

## 2023-06-14 MED ORDER — LIDOCAINE 2% (20 MG/ML) 5 ML SYRINGE
INTRAMUSCULAR | Status: DC | PRN
  Administered 2023-06-14: 60 mg via INTRAVENOUS

## 2023-06-14 MED ORDER — SODIUM CHLORIDE 0.9 % IV SOLN: INTRAVENOUS | Status: DC

## 2023-06-14 MED ORDER — PROPOFOL 500 MG/50ML IV EMUL
INTRAVENOUS | Status: DC | PRN
  Administered 2023-06-14: 100 ug/kg/min via INTRAVENOUS

## 2023-06-14 MED ORDER — PROPOFOL 10 MG/ML IV BOLUS
INTRAVENOUS | Status: DC | PRN
  Administered 2023-06-14: 20 mg via INTRAVENOUS
  Administered 2023-06-14: 50 mg via INTRAVENOUS

## 2023-06-14 SURGICAL SUPPLY — 22 items

## 2023-06-14 NOTE — Discharge Instructions (Signed)

## 2023-06-14 NOTE — Anesthesia Postprocedure Evaluation (Signed)
Anesthesia Post Note  Patient: Randy Myers  Procedure(s) Performed: COLONOSCOPY WITH PROPOFOL POLYPECTOMY     Patient location during evaluation: Endoscopy Anesthesia Type: MAC Level of consciousness: awake and alert Pain management: pain level controlled Vital Signs Assessment: post-procedure vital signs reviewed and stable Respiratory status: spontaneous breathing, nonlabored ventilation, respiratory function stable and patient connected to nasal cannula oxygen Cardiovascular status: blood pressure returned to baseline and stable Postop Assessment: no apparent nausea or vomiting Anesthetic complications: no   No notable events documented.  Last Vitals:  Vitals:   06/14/23 0955 06/14/23 1000  BP:    Pulse: 61 64  Resp: 14 17  Temp:    SpO2: 100% 99%    Last Pain:  Vitals:   06/14/23 0949  TempSrc:   PainSc: 0-No pain                 Trevor Iha

## 2023-06-14 NOTE — Op Note (Signed)
[facility] Patient Name: Randy Myers Procedure Date: 06/14/2023 MRN: [id number] Attending MD: Liliane Shi DO, DO, [id number] Date of Birth: Jan 30, 1946 CSN: [id number] Age: 77 Admit Type: Outpatient Procedure:                Colonoscopy Indications:              Rectal bleeding Providers:                Liliane Shi DO, DO, Martha Clan, RN,                            Marja Kays, Technician Referring MD:              Medicines:                See the Anesthesia note for documentation of the                            administered medications Complications:            No immediate complications. Estimated Blood Loss:     Estimated blood loss was minimal. Procedure:                Pre-Anesthesia Assessment:                           - ASA Grade Assessment: III - A patient with severe                            systemic disease.                           - The risks and benefits of the procedure and the                            sedation options and risks were discussed with the                            patient. All questions were answered and informed                            consent was obtained.                           After obtaining informed consent, the colonoscope                            was passed under direct vision. Throughout the                            procedure, the patient's blood pressure, pulse, and                            oxygen saturations were monitored continuously. The                            CF-HQ190L ([id number]) Olympus colonoscope was  introduced through the anus and advanced to the the                            terminal ileum, with identification of the                            appendiceal orifice and IC valve. The colonoscopy                            was performed without difficulty. The patient                            tolerated the procedure well. The quality of the                             bowel preparation was evaluated using the BBPS                            [facility] Bowel Preparation Scale) with scores of:                            Right Colon = 3, Transverse Colon = 3 and Left                            Colon = 3 (entire mucosa seen well with no residual                            staining, small fragments of stool or opaque                            liquid). The total BBPS score equals 9. Scope In: 9:05:28 AM Scope Out: 9:28:30 AM Scope Withdrawal Time: 0 hours 13 minutes 28 seconds  Total Procedure Duration: 0 hours 23 minutes 2 seconds  Findings:      The digital rectal exam findings include internal hemorrhoids (Grade I).      A 4 mm polyp was found in the sigmoid colon. The polyp was sessile. The       polyp was removed with a cold snare. Resection and retrieval were       complete.      A few small-mouthed diverticula were found in the left colon.      A diminutive polyp was found in the appendiceal orifice. The polyp was       sessile. The polyp was removed with a cold biopsy forceps. Resection and       retrieval were complete.      The terminal ileum appeared normal. Impression:               - Internal hemorrhoids (Grade I) found on digital                            rectal exam.                           - One 4 mm polyp in the sigmoid colon, removed  with                            a cold snare. Resected and retrieved.                           - Diverticulosis in the sigmoid colon.                           - One diminutive polyp at the appendiceal orifice,                            removed with a cold biopsy forceps. Resected and                            retrieved.                           - The examined portion of the ileum was normal. Moderate Sedation:      See the other procedure note for documentation of moderate sedation with       intraservice time. Recommendation:           - Discharge patient to home.                            - High fiber diet.                           - Continue present medications.                           - Resume Eliquis (apixaban) at prior dose tomorrow.                           - Await pathology results.                           - Repeat colonoscopy date to be determined after                            pending pathology results are reviewed for                            surveillance.                           - Return to my office PRN. Procedure Code(s):        --- Professional ---                           [phone], Colonoscopy, flexible; with removal of                            tumor(s), polyp(s), or other lesion(s) by snare                            technique  28413, 59, Colonoscopy, flexible; with biopsy,                            single or multiple Diagnosis Code(s):        --- Professional ---                           D12.5, Benign neoplasm of sigmoid colon                           D12.1, Benign neoplasm of appendix                           K64.0, First degree hemorrhoids                           K62.5, Hemorrhage of anus and rectum                           K57.30, Diverticulosis of large intestine without                            perforation or abscess without bleeding CPT copyright 2022 American Medical Association. All rights reserved. The codes documented in this report are preliminary and upon coder review may  be revised to meet current compliance requirements. Dr Liliane Shi, DO Liliane Shi DO, DO 06/14/2023 9:37:17 AM Number of Addenda: 0

## 2023-06-14 NOTE — H&P (Signed)
[facility] H&P  Subjective: Randy Myers is a 77 y.o. male who presents for colonoscopy. He was seen and evaluated in office by myself in February 2024 for blood per rectum intermittently, a colonoscopy was recommended. He has history of coronary artery disease, having had CABG in October 2023, follows with Cardiology regularly. He also has history paroxysmal atrial fibrillation, is on Eliquis, last dose was 06/11/2023.  Was seen by cardiology in February 2024 for cardiac risk stratification, again seen by cardiology in August 2024.  No current chest pain, shortness of breath or abdominal pain.  Last had blood per rectum few weeks prior, did have some blood per rectum with bowel preparation.  Last colonoscopy 05/20/2014 by Dr. Madilyn Fireman for colorectal cancer screening with normal findings.  Recommended to have repeat colonoscopy in 10 years.  Objective: General: Awake and alert, non-toxic in appearance Cardio: Regular rate and rhythm  Pulm: Clear to auscultation, no conversational dyspnea Abdomen: Soft, non-tender to palpation, bowel sounds appreciated    Assessment:  Blood per rectum  Plan:  -Discussed procedure in detail including benefits, alternatives and risks of bleeding/infection/perforation/missed lesion/anesthesia, he verbalized understanding and elected to proceed -Proceed to colonoscopy today -Further recommendations to follow pending procedure  Liliane Shi, DO [facility]

## 2023-06-14 NOTE — Anesthesia Procedure Notes (Addendum)
Procedure Name: MAC Date/Time: 06/14/2023 8:54 AM  Performed by: Maurene Capes, CRNAPre-anesthesia Checklist: Patient identified, Emergency Drugs available, Suction available and Patient being monitored Patient Re-evaluated:Patient Re-evaluated prior to induction Oxygen Delivery Method: Simple face mask Preoxygenation: Pre-oxygenation with 100% oxygen Induction Type: IV induction Placement Confirmation: positive ETCO2 Dental Injury: Teeth and Oropharynx as per pre-operative assessment

## 2023-06-14 NOTE — Transfer of Care (Signed)
Immediate Anesthesia Transfer of Care Note  Patient: Randy Myers  Procedure(s) Performed: COLONOSCOPY WITH PROPOFOL POLYPECTOMY  Patient Location: PACU and Endoscopy Unit  Anesthesia Type:MAC  Level of Consciousness: sedated  Airway & Oxygen Therapy: Patient Spontanous Breathing and Patient connected to face mask oxygen  Post-op Assessment: Report given to RN and Post -op Vital signs reviewed and stable  Post vital signs: Reviewed and stable  Last Vitals:  Vitals Value Taken Time  BP 115/59 06/14/23 0950  Temp 36.3 C 06/14/23 0945  Pulse 62 06/14/23 0951  Resp 17 06/14/23 0952  SpO2 99 % 06/14/23 0951  Vitals shown include unfiled device data.  Last Pain:  Vitals:   06/14/23 0949  TempSrc:   PainSc: 0-No pain         Complications: No notable events documented.

## 2023-06-20 ENCOUNTER — Encounter (HOSPITAL_COMMUNITY): Payer: Self-pay | Admitting: Internal Medicine

## 2023-06-20 DIAGNOSIS — D2271 Melanocytic nevi of right lower limb, including hip: Secondary | ICD-10-CM | POA: Diagnosis not present

## 2023-06-20 DIAGNOSIS — L821 Other seborrheic keratosis: Secondary | ICD-10-CM | POA: Diagnosis not present

## 2023-06-20 DIAGNOSIS — D225 Melanocytic nevi of trunk: Secondary | ICD-10-CM | POA: Diagnosis not present

## 2023-06-20 DIAGNOSIS — E119 Type 2 diabetes mellitus without complications: Secondary | ICD-10-CM | POA: Diagnosis not present

## 2023-06-20 DIAGNOSIS — L570 Actinic keratosis: Secondary | ICD-10-CM | POA: Diagnosis not present

## 2023-06-20 DIAGNOSIS — Z85828 Personal history of other malignant neoplasm of skin: Secondary | ICD-10-CM | POA: Diagnosis not present

## 2023-06-20 DIAGNOSIS — L814 Other melanin hyperpigmentation: Secondary | ICD-10-CM | POA: Diagnosis not present

## 2023-08-23 DIAGNOSIS — I5042 Chronic combined systolic (congestive) and diastolic (congestive) heart failure: Secondary | ICD-10-CM | POA: Diagnosis not present

## 2023-08-23 DIAGNOSIS — E7800 Pure hypercholesterolemia, unspecified: Secondary | ICD-10-CM | POA: Diagnosis not present

## 2023-08-23 DIAGNOSIS — D6869 Other thrombophilia: Secondary | ICD-10-CM | POA: Diagnosis not present

## 2023-08-23 DIAGNOSIS — Z0000 Encounter for general adult medical examination without abnormal findings: Secondary | ICD-10-CM | POA: Diagnosis not present

## 2023-08-23 DIAGNOSIS — E261 Secondary hyperaldosteronism: Secondary | ICD-10-CM | POA: Diagnosis not present

## 2023-08-23 DIAGNOSIS — D696 Thrombocytopenia, unspecified: Secondary | ICD-10-CM | POA: Diagnosis not present

## 2023-08-23 DIAGNOSIS — I110 Hypertensive heart disease with heart failure: Secondary | ICD-10-CM | POA: Diagnosis not present

## 2023-08-23 DIAGNOSIS — I10 Essential (primary) hypertension: Secondary | ICD-10-CM | POA: Diagnosis not present

## 2023-08-23 DIAGNOSIS — Z1331 Encounter for screening for depression: Secondary | ICD-10-CM | POA: Diagnosis not present

## 2023-08-23 DIAGNOSIS — Z23 Encounter for immunization: Secondary | ICD-10-CM | POA: Diagnosis not present

## 2023-08-23 DIAGNOSIS — I480 Paroxysmal atrial fibrillation: Secondary | ICD-10-CM | POA: Diagnosis not present

## 2023-09-19 DIAGNOSIS — C61 Malignant neoplasm of prostate: Secondary | ICD-10-CM | POA: Diagnosis not present

## 2023-09-19 DIAGNOSIS — Z8546 Personal history of malignant neoplasm of prostate: Secondary | ICD-10-CM | POA: Diagnosis not present

## 2023-09-26 DIAGNOSIS — N5201 Erectile dysfunction due to arterial insufficiency: Secondary | ICD-10-CM | POA: Diagnosis not present

## 2023-09-26 DIAGNOSIS — Z8546 Personal history of malignant neoplasm of prostate: Secondary | ICD-10-CM | POA: Diagnosis not present

## 2023-10-04 DIAGNOSIS — H35363 Drusen (degenerative) of macula, bilateral: Secondary | ICD-10-CM | POA: Diagnosis not present

## 2023-10-04 DIAGNOSIS — H2513 Age-related nuclear cataract, bilateral: Secondary | ICD-10-CM | POA: Diagnosis not present

## 2023-10-04 DIAGNOSIS — H353131 Nonexudative age-related macular degeneration, bilateral, early dry stage: Secondary | ICD-10-CM | POA: Diagnosis not present

## 2023-10-04 DIAGNOSIS — D3132 Benign neoplasm of left choroid: Secondary | ICD-10-CM | POA: Diagnosis not present

## 2023-10-06 ENCOUNTER — Emergency Department (HOSPITAL_COMMUNITY)
Admission: EM | Admit: 2023-10-06 | Discharge: 2023-10-06 | Disposition: A | Discharge status: Home / Self Care | Payer: Medicare HMO | Attending: Emergency Medicine | Admitting: Emergency Medicine

## 2023-10-06 ENCOUNTER — Emergency Department (HOSPITAL_COMMUNITY): Payer: Medicare HMO

## 2023-10-06 ENCOUNTER — Encounter (HOSPITAL_COMMUNITY): Payer: Self-pay

## 2023-10-06 DIAGNOSIS — R220 Localized swelling, mass and lump, head: Secondary | ICD-10-CM | POA: Diagnosis not present

## 2023-10-06 DIAGNOSIS — Z7901 Long term (current) use of anticoagulants: Secondary | ICD-10-CM | POA: Diagnosis not present

## 2023-10-06 DIAGNOSIS — Z7982 Long term (current) use of aspirin: Secondary | ICD-10-CM | POA: Insufficient documentation

## 2023-10-06 DIAGNOSIS — S199XXA Unspecified injury of neck, initial encounter: Secondary | ICD-10-CM | POA: Diagnosis not present

## 2023-10-06 DIAGNOSIS — W19XXXA Unspecified fall, initial encounter: Secondary | ICD-10-CM

## 2023-10-06 DIAGNOSIS — W108XXA Fall (on) (from) other stairs and steps, initial encounter: Secondary | ICD-10-CM | POA: Insufficient documentation

## 2023-10-06 DIAGNOSIS — S62666A Nondisplaced fracture of distal phalanx of right little finger, initial encounter for closed fracture: Secondary | ICD-10-CM | POA: Diagnosis not present

## 2023-10-06 DIAGNOSIS — S0181XA Laceration without foreign body of other part of head, initial encounter: Secondary | ICD-10-CM | POA: Diagnosis not present

## 2023-10-06 DIAGNOSIS — S0990XA Unspecified injury of head, initial encounter: Secondary | ICD-10-CM | POA: Diagnosis not present

## 2023-10-06 DIAGNOSIS — S0101XA Laceration without foreign body of scalp, initial encounter: Secondary | ICD-10-CM | POA: Diagnosis not present

## 2023-10-06 DIAGNOSIS — S62609A Fracture of unspecified phalanx of unspecified finger, initial encounter for closed fracture: Secondary | ICD-10-CM | POA: Diagnosis not present

## 2023-10-06 DIAGNOSIS — M19041 Primary osteoarthritis, right hand: Secondary | ICD-10-CM | POA: Diagnosis not present

## 2023-10-06 LAB — SAMPLE TO BLOOD BANK

## 2023-10-06 LAB — COMPREHENSIVE METABOLIC PANEL
ALT: 15 U/L (ref 0–44)
AST: 28 U/L (ref 15–41)
Albumin: 4 g/dL (ref 3.5–5.0)
Alkaline Phosphatase: 47 U/L (ref 38–126)
Anion gap: 8 (ref 5–15)
BUN: 29 mg/dL — ABNORMAL HIGH (ref 8–23)
CO2: 24 mmol/L (ref 22–32)
Calcium: 9.3 mg/dL (ref 8.9–10.3)
Chloride: 105 mmol/L (ref 98–111)
Creatinine, Ser: 1.01 mg/dL (ref 0.61–1.24)
GFR, Estimated: >60 mL/min (ref >60)
Glucose, Bld: 146 mg/dL — ABNORMAL HIGH (ref 70–99)
Potassium: 4.3 mmol/L (ref 3.5–5.1)
Sodium: 137 mmol/L (ref 135–145)
Total Bilirubin: 1.1 mg/dL (ref <1.2)
Total Protein: 6.8 g/dL (ref 6.5–8.1)

## 2023-10-06 LAB — I-STAT CHEM 8, ED
BUN: 27 mg/dL — ABNORMAL HIGH (ref 8–23)
Calcium, Ion: 1.2 mmol/L (ref 1.15–1.40)
Chloride: 103 mmol/L (ref 98–111)
Creatinine, Ser: 1 mg/dL (ref 0.61–1.24)
Glucose, Bld: 146 mg/dL — ABNORMAL HIGH (ref 70–99)
HCT: 38 % — ABNORMAL LOW (ref 39.0–52.0)
Hemoglobin: 12.9 g/dL — ABNORMAL LOW (ref 13.0–17.0)
Potassium: 4.4 mmol/L (ref 3.5–5.1)
Sodium: 138 mmol/L (ref 135–145)
TCO2: 22 mmol/L (ref 22–32)

## 2023-10-06 LAB — CBC
HCT: 39.3 % (ref 39.0–52.0)
Hemoglobin: 12.3 g/dL — ABNORMAL LOW (ref 13.0–17.0)
MCH: 28.2 pg (ref 26.0–34.0)
MCHC: 31.3 g/dL (ref 30.0–36.0)
MCV: 90.1 fL (ref 80.0–100.0)
Platelets: 146 10*3/uL — ABNORMAL LOW (ref 150–400)
RBC: 4.36 MIL/uL (ref 4.22–5.81)
RDW: 13.9 % (ref 11.5–15.5)
WBC: 4.7 10*3/uL (ref 4.0–10.5)
nRBC: 0 % (ref 0.0–0.2)

## 2023-10-06 LAB — URINALYSIS, ROUTINE W REFLEX MICROSCOPIC
Bilirubin Urine: NEGATIVE
Glucose, UA: NEGATIVE mg/dL
Hgb urine dipstick: NEGATIVE
Ketones, ur: NEGATIVE mg/dL
Leukocytes,Ua: NEGATIVE
Nitrite: NEGATIVE
Protein, ur: NEGATIVE mg/dL
Specific Gravity, Urine: 1.013 (ref 1.005–1.030)
pH: 6 (ref 5.0–8.0)

## 2023-10-06 LAB — PROTIME-INR
INR: 1.1 (ref 0.8–1.2)
Prothrombin Time: 14.8 s (ref 11.4–15.2)

## 2023-10-06 LAB — ETHANOL: Alcohol, Ethyl (B): <10 mg/dL (ref <10)

## 2023-10-06 LAB — I-STAT CG4 LACTIC ACID, ED: Lactic Acid, Venous: 1.7 mmol/L (ref 0.5–1.9)

## 2023-10-06 MED ORDER — LIDOCAINE-EPINEPHRINE (PF) 2 %-1:200000 IJ SOLN
20.0000 mL | Freq: Once | INTRAMUSCULAR | Status: AC
  Administered 2023-10-06: 20 mL
  Filled 2023-10-06: qty 20

## 2023-10-06 NOTE — ED Notes (Signed)
Trauma Response Nurse Documentation  Randy Myers is a 77 y.o. male arriving to [facility] ED via POV  On Eliquis (apixaban) daily. Trauma was activated as a Level 2 based on the following trauma criteria Elderly patients > 65 with head trauma on anti-coagulation (excluding ASA).  Patient cleared for CT by triage MD. Pt transported to CT with trauma response nurse present to monitor. RN remained with the patient throughout their absence from the department for clinical observation. GCS 15.  History   Past Medical History:  Diagnosis Date   Arthritis    Coronary artery disease    GERD (gastroesophageal reflux disease)    Hypertension    Migraine    Multiple vessel coronary artery disease 08/20/2022   Presented with unstable angina 08/19/2022: Cath -> ost-prox RCA 40%, prox-mid RCA 100% (~CTO w/ L-R collaterals); Ost-Prox LAD 40% prox-midLAD 99% (appears to be subtotally occluded at major SP branch.); Prox-mid LCx 70%, OM2 70%.  LVEF 25 to 35% (moderate to severely reduced function.  Severe HK of anterior wall 15 mmHg). ==> CABG, concern for PCI of the LAD could jeopardize large SP branch. => W   Prostate cancer [facility])    Prostate cancer [facility])    with radiation   Stroke (HCC) 06/10/2013   Stroke [facility])      Past Surgical History:  Procedure Laterality Date   BACK SURGERY  1983   BACK SURGERY     COLONOSCOPY WITH PROPOFOL N/A 06/14/2023   Procedure: COLONOSCOPY WITH PROPOFOL;  Surgeon: Lynann Bologna, DO;  Location: [facility];  Service: Gastroenterology;  Laterality: N/A;   CORONARY ARTERY BYPASS GRAFT N/A 08/24/2022   Procedure: CORONARY ARTERY BYPASS GRAFTING (CABG) x3, USING LEFT INTERNAL MAMMARY ARTERY AND ENDOSCOPICALLY HARVESTED RIGHT GREATER SAPHENOUS VEIN;  Surgeon: Corliss Skains, MD;  Location: [facility] OR;  Service: Open Heart Surgery;  Laterality: N/A;   EYE SURGERY     LEFT HEART CATH AND CORONARY ANGIOGRAPHY N/A 08/19/2022   Procedure: LEFT HEART CATH AND CORONARY  ANGIOGRAPHY;  Surgeon: Kathleene Hazel, MD;  Location: [facility] INVASIVE CV LAB;  Service: Cardiovascular;  Laterality: N/A;   POLYPECTOMY  06/14/2023   Procedure: POLYPECTOMY;  Surgeon: Lynann Bologna, DO;  Location: [facility];  Service: Gastroenterology;;   PROSTATE BIOPSY     TEE WITHOUT CARDIOVERSION N/A 08/24/2022   Procedure: TRANSESOPHAGEAL ECHOCARDIOGRAM (TEE);  Surgeon: Corliss Skains, MD;  Location: [facility] OR;  Service: Open Heart Surgery;  Laterality: N/A;     Initial Focused Assessment (If applicable, or please see trauma documentation): Patient A&Ox4, GCS 15, PERR 3 AIrway intact, bilateral breath sounds Pulses 2+ Laceration to left forehead  CT's Completed:   CT Head and CT C-Spine   Interventions:  IV, labs CT Head/Cspine  Plan for disposition:  Discharge home   Event Summary: Patient to ED after a mechanical fall where he tripped on the step and fell into his jeep catching his forehead on the jeep door. 3in laceration to left forehead, no other complaints. CT revealed no traumatic injury. Patient head laceration stapled in ED. Patients wife at bedside, able to discharge home.  Bedside handoff with ED RN Liz Beach.    Jill Side Zander Ingham  Trauma Response RN  Please call TRN at [phone] for further assistance.

## 2023-10-06 NOTE — Discharge Instructions (Addendum)
Please be sure to follow-up with your physician in 1 week for suture removal.  Return here for concerning changes in your condition.

## 2023-10-06 NOTE — ED Triage Notes (Signed)
Pt is coming in for a fall that occurred a couple hours ago, he fell down some steps that caused him to fall into a jeep. Pt has a very large laceration that is across his forehead. The bleeding is controlled at this time.

## 2023-10-06 NOTE — ED Provider Notes (Signed)
[place] EMERGENCY DEPARTMENT AT [facility] Provider Note   CSN: [id number] Arrival date & time: 10/06/23  1159     History  Chief Complaint  Patient presents with   Randy Myers is a 77 y.o. male.  HPI Patient presents with his wife who assists with the history.  He presents after mechanical fall.  He notes that he was going down the steps, missed the last 1, fell forward striking his head.  No loss of consciousness, no new focal weakness in any extremity.  He does have generalized weakness, however. He has some pain in the left forehead, none in the neck.    Home Medications Prior to Admission medications   Medication Sig Start Date End Date Taking? Authorizing Provider  acetaminophen (TYLENOL) 650 MG CR tablet Take 650 mg by mouth every 8 (eight) hours as needed for pain.    [provider]  apixaban (ELIQUIS) 5 MG TABS tablet Take 1 tablet (5 mg total) by mouth 2 (two) times daily. 09/02/22   Rowe Clack, PA-C  aspirin EC 81 MG tablet Take 1 tablet (81 mg total) by mouth daily. Swallow whole. 09/02/22   Gold, Wayne E, PA-C  atorvastatin (LIPITOR) 80 MG tablet Take 1 tablet (80 mg total) by mouth daily. 09/02/22   Rowe Clack, PA-C  furosemide (LASIX) 20 MG tablet Take 1 tablet every Monday, Wednesday, and Friday 09/14/22   Robbie Lis M, PA-C  losartan (COZAAR) 25 MG tablet TAKE 1 TABLET EVERY DAY 05/23/23   Robbie Lis M, PA-C  metoprolol succinate (TOPROL-XL) 25 MG 24 hr tablet Take 1 tablet (25 mg total) by mouth daily. Take with or immediately following a meal. 10/21/22   Dick, Devoria Albe., NP  Multiple Vitamin (MULTIVITAMIN WITH MINERALS) TABS tablet Take 1 tablet by mouth daily with breakfast.    [provider]  nitroGLYCERIN (NITROSTAT) 0.4 MG SL tablet Place 0.4 mg under the tongue every 5 (five) minutes as needed for chest pain. 08/11/22   [provider]  omeprazole (PRILOSEC) 40 MG capsule Take 40 mg  by mouth daily with breakfast.  08/09/19   [provider]  spironolactone (ALDACTONE) 25 MG tablet Take 1/2 tablet (12.5 mg total) by mouth daily. 09/02/22   Gold, Glenice Laine, PA-C  traMADol (ULTRAM) 50 MG tablet Take 1 tablet (50 mg total) by mouth every 6 (six) hours as needed for moderate pain. 09/02/22   Rowe Clack, PA-C      Allergies    Patient has no known allergies.    Review of Systems   Review of Systems  Physical Exam Updated Vital Signs BP 129/75   Pulse (!) 57   Temp 98 F (36.7 C)   Resp 18   Ht 6' (1.829 m)   Wt 90.7 kg   SpO2 100%   BMI 27.12 kg/m  Physical Exam Vitals and nursing note reviewed.  Constitutional:      General: He is not in acute distress.    Appearance: He is well-developed.  HENT:     Head: Normocephalic.   Eyes:     Conjunctiva/sclera: Conjunctivae normal.  Cardiovascular:     Rate and Rhythm: Normal rate and regular rhythm.  Pulmonary:     Effort: Pulmonary effort is normal. No respiratory distress.     Breath sounds: No stridor.  Abdominal:     General: There is no distension.  Skin:    General: Skin is  warm and dry.  Neurological:     General: No focal deficit present.     Mental Status: He is alert and oriented to person, place, and time.     Motor: No weakness, tremor, atrophy or abnormal muscle tone.     ED Results / Procedures / Treatments   Labs (all labs ordered are listed, but only abnormal results are displayed) Labs Reviewed  COMPREHENSIVE METABOLIC PANEL - Abnormal; Notable for the following components:      Result Value   Glucose, Bld 146 (*)    BUN 29 (*)    All other components within normal limits  CBC - Abnormal; Notable for the following components:   Hemoglobin 12.3 (*)    Platelets 146 (*)    All other components within normal limits  I-STAT CHEM 8, ED - Abnormal; Notable for the following components:   BUN 27 (*)    Glucose, Bld 146 (*)    Hemoglobin 12.9 (*)    HCT 38.0 (*)    All other  components within normal limits  ETHANOL  PROTIME-INR  URINALYSIS, ROUTINE W REFLEX MICROSCOPIC  I-STAT CG4 LACTIC ACID, ED  SAMPLE TO BLOOD BANK    EKG None  Radiology CT HEAD WO CONTRAST  Result Date: 10/06/2023 CLINICAL DATA:  Head trauma, moderate-severe; Polytrauma, blunt EXAM: CT HEAD WITHOUT CONTRAST CT CERVICAL SPINE WITHOUT CONTRAST TECHNIQUE: Multidetector CT imaging of the head and cervical spine was performed following the standard protocol without intravenous contrast. Multiplanar CT image reconstructions of the cervical spine were also generated. RADIATION DOSE REDUCTION: This exam was performed according to the departmental dose-optimization program which includes automated exposure control, adjustment of the mA and/or kV according to patient size and/or use of iterative reconstruction technique. COMPARISON:  None Available. FINDINGS: CT HEAD FINDINGS Brain: No hemorrhage. No hydrocephalus. No extra-axial fluid collection. No CT evidence of an acute cortical infarct. No mass effect. No mass lesion. Vascular: No hyperdense vessel or unexpected calcification. Skull: Soft swelling in the periorbital soft tissues on the left. No evidence of an underlying calvarial fracture. Soft tissue laceration along the left frontal scalp. Sinuses/Orbits: No middle ear or mastoid effusion. Paranasal sinuses are notable for mucosal thickening in the left maxillary sinus. Orbits are unremarkable. Other: None. CT CERVICAL SPINE FINDINGS Alignment: Normal. Skull base and vertebrae: No acute fracture. No primary bone lesion or focal pathologic process. Soft tissues and spinal canal: No prevertebral fluid or swelling. No visible canal hematoma. Disc levels:  No CT evidence of high-grade spinal canal stenosis. Upper chest: Negative. Other: No IMPRESSION: 1. No CT evidence of intracranial injury. 2. Soft tissue laceration along the left frontal scalp. No evidence of an underlying calvarial fracture. 3. No acute  fracture or traumatic subluxation of the cervical spine. Electronically Signed   By: Lorenza Cambridge M.D.   On: 10/06/2023 13:18   CT CERVICAL SPINE WO CONTRAST  Result Date: 10/06/2023 CLINICAL DATA:  Head trauma, moderate-severe; Polytrauma, blunt EXAM: CT HEAD WITHOUT CONTRAST CT CERVICAL SPINE WITHOUT CONTRAST TECHNIQUE: Multidetector CT imaging of the head and cervical spine was performed following the standard protocol without intravenous contrast. Multiplanar CT image reconstructions of the cervical spine were also generated. RADIATION DOSE REDUCTION: This exam was performed according to the departmental dose-optimization program which includes automated exposure control, adjustment of the mA and/or kV according to patient size and/or use of iterative reconstruction technique. COMPARISON:  None Available. FINDINGS: CT HEAD FINDINGS Brain: No hemorrhage. No hydrocephalus. No extra-axial fluid collection.  No CT evidence of an acute cortical infarct. No mass effect. No mass lesion. Vascular: No hyperdense vessel or unexpected calcification. Skull: Soft swelling in the periorbital soft tissues on the left. No evidence of an underlying calvarial fracture. Soft tissue laceration along the left frontal scalp. Sinuses/Orbits: No middle ear or mastoid effusion. Paranasal sinuses are notable for mucosal thickening in the left maxillary sinus. Orbits are unremarkable. Other: None. CT CERVICAL SPINE FINDINGS Alignment: Normal. Skull base and vertebrae: No acute fracture. No primary bone lesion or focal pathologic process. Soft tissues and spinal canal: No prevertebral fluid or swelling. No visible canal hematoma. Disc levels:  No CT evidence of high-grade spinal canal stenosis. Upper chest: Negative. Other: No IMPRESSION: 1. No CT evidence of intracranial injury. 2. Soft tissue laceration along the left frontal scalp. No evidence of an underlying calvarial fracture. 3. No acute fracture or traumatic subluxation of the  cervical spine. Electronically Signed   By: Lorenza Cambridge M.D.   On: 10/06/2023 13:18    Procedures Procedures    Medications Ordered in ED Medications  lidocaine-EPINEPHrine (XYLOCAINE W/EPI) 2 %-1:200000 (PF) injection 20 mL (20 mLs Infiltration Given 10/06/23 1401)    ED Course/ Medical Decision Making/ A&P                                 Medical Decision Making Elderly male on Eliquis presents after a fall with head trauma and an open wound.  Concern for cranial abnormality versus fracture.  Patient's obvious laceration noted.  Patient's initial neuroexam is reassuring, as are his vital signs.  Patient had head CT, neck CT, labs. Cardiac 65 sinus normal Pulse ox 97% room air normal  Amount and/or Complexity of Data Reviewed Independent Historian: spouse External Data Reviewed: notes. Labs: ordered. Decision-making details documented in ED Course. Radiology: ordered and independent interpretation performed. Decision-making details documented in ED Course.  Risk Prescription drug management. Decision regarding hospitalization. Diagnosis or treatment significantly limited by social determinants of health.  3:03 PM Patient awake, alert, tolerated laceration repair well.  During the repair patient noted that he had discoloration and some discomfort about the right fifth digit, x-ray has been ordered, reviewed, distal phalanx fracture, splint will be applied. With otherwise reassuring findings, labs, CT head, CT neck, no decompensation for hours of monitoring in the ED, no additional complaints, no neurodeficits, the patient appropriate for discharge with outpatient follow-up following presentation for fall.  LACERATION REPAIR Performed by: Gerhard Munch Authorized by: Gerhard Munch Consent: Verbal consent obtained. Risks and benefits: risks, benefits and alternatives were discussed Consent given by: patient Patient identity confirmed: provided demographic data Prepped and  Draped in normal sterile fashion Wound explored  Laceration Location: face - L upper  Laceration Length: 12cm  No Foreign Bodies seen or palpated  Anesthesia: local infiltration  Local anesthetic: lidocaine 1% w epinephrine  Anesthetic total: 5 ml  Irrigation method: syringe Amount of cleaning: standard  Skin closure: 5-0 sutures  Number of sutures: 10  Technique: close  Patient tolerance: Patient tolerated the procedure well with no immediate complications.   Final Clinical Impression(s) / ED Diagnoses Final diagnoses:  Fall, initial encounter  Closed nondisplaced fracture of distal phalanx of right little finger, initial encounter  Facial laceration, initial encounter    Rx / DC Orders ED Discharge Orders     None         Gerhard Munch, MD 10/06/23 1503

## 2023-10-06 NOTE — Progress Notes (Signed)
Orthopedic Tech Progress Note Patient Details:  Randy Myers Mar 01, 1946 [id number] Responded to level 2 trauma, not needed at this time.  Patient ID: Randy Myers, male   DOB: 05-15-1946, 77 y.o.   MRN: [id number]  Diannia Ruder 10/06/2023, 1:50 PM

## 2023-10-06 NOTE — Progress Notes (Signed)
Orthopedic Tech Progress Note Patient Details:  Randy Myers 03/18/1946 [id number]  Ortho Devices Type of Ortho Device: Finger splint Ortho Device/Splint Location: 5th RUE Ortho Device/Splint Interventions: Ordered, Application   Post Interventions Patient Tolerated: Well  [name] A Kharizma Lesnick 10/06/2023, 3:17 PM

## 2023-10-14 DIAGNOSIS — S62666A Nondisplaced fracture of distal phalanx of right little finger, initial encounter for closed fracture: Secondary | ICD-10-CM | POA: Diagnosis not present

## 2023-10-17 DIAGNOSIS — S62666A Nondisplaced fracture of distal phalanx of right little finger, initial encounter for closed fracture: Secondary | ICD-10-CM | POA: Diagnosis not present

## 2023-10-17 DIAGNOSIS — Z4802 Encounter for removal of sutures: Secondary | ICD-10-CM | POA: Diagnosis not present

## 2023-10-17 DIAGNOSIS — W109XXA Fall (on) (from) unspecified stairs and steps, initial encounter: Secondary | ICD-10-CM | POA: Diagnosis not present

## 2023-10-17 DIAGNOSIS — S0101XA Laceration without foreign body of scalp, initial encounter: Secondary | ICD-10-CM | POA: Diagnosis not present

## 2023-12-05 NOTE — Progress Notes (Signed)
 Cardiology Office Note    Patient Name: Randy Myers Date of Encounter: 12/06/2023  Primary Care Provider:  Sun, Vyvyan, MD Primary Cardiologist:  Oneil Parchment, MD Primary Electrophysiologist: None   Past Medical History    Past Medical History:  Diagnosis Date   Arthritis    Coronary artery disease    GERD (gastroesophageal reflux disease)    Hypertension    Migraine    Multiple vessel coronary artery disease 08/20/2022   Presented with unstable angina 08/19/2022: Cath -> ost-prox RCA 40%, prox-mid RCA 100% (~CTO w/ L-R collaterals); Ost-Prox LAD 40% prox-midLAD 99% (appears to be subtotally occluded at major SP branch.); Prox-mid LCx 70%, OM2 70%.  LVEF 25 to 35% (moderate to severely reduced function.  Severe HK of anterior wall 15 mmHg). ==> CABG, concern for PCI of the LAD could jeopardize large SP branch. => W   Prostate cancer [facility])    Prostate cancer [facility])    with radiation   Stroke (HCC) 06/10/2013   Stroke [facility])     History of Present Illness  Randy Myers is a 78 y.o. male with PMH of CAD s/p CABG x 3, HTN, ICM, HFrEF, prostate CA s/p radiation treatment, CVA who presents today for 47-month follow-up.  Randy Myers was last seen by Dr. Parchment on 12/08/2022 for follow-up.  During visit he had complaints of intermittent lower extremity edema and denied any lightheadedness or chest pain.  He was seen most recently by Rosaline Bane, NP for 66-month follow-up and reported doing well but noted moderate shortness of breath with exertion as well as significant diaphoresis. He reports being active around his home and blood pressures were stable.  Randy Myers presents today for 57-month follow-up with his wife.  During today's visit he has complaints of shortness of breath and fatigue. He reports these symptoms have been more noticeable during the winter months when he is less active. The patient also experiences excessive sweating, which occurs even in cool environments and is not  necessarily associated with physical exertion. He also reports nipple pain and swelling on one side, which is a new symptom. The patient had a recent fall where he hit his head and required stitches. He also mentions a mildly dilated aorta, which was identified in a previous echocardiogram.  He had questions regarding his aortic aneurysm reviewed his last echo results and answered all questions for patient.   Patient denies chest pain, palpitations, dyspnea, PND, orthopnea, nausea, vomiting, dizziness, syncope, edema, weight gain, or early satiety.  Review of Systems  Please see the history of present illness.    All other systems reviewed and are otherwise negative except as noted above.  Physical Exam    Wt Readings from Last 3 Encounters:  12/06/23 217 lb (98.4 kg)  10/06/23 200 lb (90.7 kg)  06/14/23 200 lb (90.7 kg)   VS: Vitals:   12/06/23 1110  BP: 110/70  Pulse: (!) 59  ,Body mass index is 29.43 kg/m. GEN: Well nourished, well developed in no acute distress Neck: No JVD; No carotid bruits Pulmonary: Clear to auscultation without rales, wheezing or rhonchi  Cardiovascular: Normal rate. Regular rhythm. Normal S1. Normal S2.   Murmurs: There is no murmur.  ABDOMEN: Soft, non-tender, non-distended EXTREMITIES:  No edema; No deformity   EKG/LABS/ Recent Cardiac Studies   ECG personally reviewed by me today -sinus bradycardia with rate of 59 BPM and no acute changes noted   Risk Assessment/Calculations:    CHA2DS2-VASc Score = 7  This indicates a 11.2% annual risk of stroke. The patient's score is based upon: CHF History: 1 HTN History: 1 Diabetes History: 0 Stroke History: 2 Vascular Disease History: 1 Age Score: 2 Gender Score: 0         Lab Results  Component Value Date   WBC 4.7 10/06/2023   HGB 12.9 (L) 10/06/2023   HCT 38.0 (L) 10/06/2023   MCV 90.1 10/06/2023   PLT 146 (L) 10/06/2023   Lab Results  Component Value Date   CREATININE 1.00 10/06/2023    BUN 27 (H) 10/06/2023   NA 138 10/06/2023   K 4.4 10/06/2023   CL 103 10/06/2023   CO2 24 10/06/2023   Lab Results  Component Value Date   CHOL 116 12/08/2022   HDL 44 12/08/2022   LDLCALC 55 12/08/2022   TRIG 86 12/08/2022   CHOLHDL 2.6 12/08/2022    Lab Results  Component Value Date   HGBA1C 5.4 08/20/2022   Assessment & Plan    1.HFmrEF/ICM:  -2D echo was completed 12/2022 with EF of 50-55% -Today patient reports shortness of breath with increased exertion which he attributes to deconditioning with less activity during winter months. -He is euvolemic today on examination. -He reports some nipple pain with spironolactone  and we will discontinue today. -Continue losartan  25 mg, Toprol -XL 25 mg, Lasix  Monday, Wednesday, Friday -Low sodium diet, fluid restriction <2L, and daily weights encouraged. Educated to contact our office for weight gain of 2 lbs overnight or 5 lbs in one week.   2.Coronary artery disease: -CABG x 3 utilizing LIMA-LAD, saphenous to OM and saphenous to PDA -He reports no chest pain today and doses reports some SOB that is not a new symptom -Continue ASA 81 mg, Toprol  XL 25 mg, Lipitor  80 mg   3.Persistent atrial fibrillation:  -Patient currently rate controlled today at 59 bpm. -Reports no recurrence of palpitations or tachycardia. -He is creatinine was 1.0 and hemoglobin 12.9 -Continue Eliquis  5 mg twice daily -Continue Toprol  XL 25 mg  4.  Hyperlipidemia: -Patients LDL is at goal  57 -Continue Lipitor  80 mg   5.  Essential hypertension: -Patient's blood pressure today was 110/70 -Continue Toprol  XL 25 mg and Losartan  25 mg QD  6.Mild dilation of the aorta  -(3.9 cm) noted on previous echocardiogram. Discussed the importance of blood pressure control, cholesterol management, and avoiding heavy lifting to prevent progression. -Repeat echocardiogram this month for surveillance.  Disposition: Follow-up with Oneil Parchment, MD or APP in 6 months    Signed, Wyn Raddle, Jackee Shove, NP 12/06/2023, 12:31 PM [facility]

## 2023-12-06 ENCOUNTER — Ambulatory Visit: Payer: Medicare HMO | Attending: Nurse Practitioner | Admitting: Nurse Practitioner

## 2023-12-06 ENCOUNTER — Encounter: Payer: Self-pay | Admitting: Nurse Practitioner

## 2023-12-06 VITALS — BP 110/70 | HR 59 | Ht 72.0 in | Wt 217.0 lb

## 2023-12-06 DIAGNOSIS — I255 Ischemic cardiomyopathy: Secondary | ICD-10-CM | POA: Diagnosis not present

## 2023-12-06 DIAGNOSIS — I10 Essential (primary) hypertension: Secondary | ICD-10-CM | POA: Diagnosis not present

## 2023-12-06 DIAGNOSIS — E785 Hyperlipidemia, unspecified: Secondary | ICD-10-CM | POA: Diagnosis not present

## 2023-12-06 DIAGNOSIS — I4819 Other persistent atrial fibrillation: Secondary | ICD-10-CM

## 2023-12-06 DIAGNOSIS — I5022 Chronic systolic (congestive) heart failure: Secondary | ICD-10-CM

## 2023-12-06 DIAGNOSIS — I25810 Atherosclerosis of coronary artery bypass graft(s) without angina pectoris: Secondary | ICD-10-CM | POA: Diagnosis not present

## 2023-12-06 DIAGNOSIS — I77819 Aortic ectasia, unspecified site: Secondary | ICD-10-CM | POA: Diagnosis not present

## 2023-12-06 DIAGNOSIS — I7121 Aneurysm of the ascending aorta, without rupture: Secondary | ICD-10-CM

## 2023-12-06 NOTE — Patient Instructions (Signed)
 Medication Instructions:   DISCONTINUE Aldactone .  *If you need a refill on your cardiac medications before your next appointment, please call your pharmacy*   Lab Work:  None ordered.  If you have labs (blood work) drawn today and your tests are completely normal, you will receive your results only by: MyChart Message (if you have MyChart) OR A paper copy in the mail If you have any lab test that is abnormal or we need to change your treatment, we will call you to review the results.   Testing/Procedures:  Your physician has requested that you have an echocardiogram. Echocardiography is a painless test that uses sound waves to create images of your heart. It provides your doctor with information about the size and shape of your heart and how well your heart's chambers and valves are working. This procedure takes approximately one hour. There are no restrictions for this procedure. Please do NOT wear cologne, aftershave, or lotions (deodorant is allowed). Please arrive 15 minutes prior to your appointment time.  Please note: We ask at that you not bring children with you during ultrasound (echo/ vascular) testing. Due to room size and safety concerns, children are not allowed in the ultrasound rooms during exams. Our front office staff cannot provide observation of children in our lobby area while testing is being conducted. An adult accompanying a patient to their appointment will only be allowed in the ultrasound room at the discretion of the ultrasound technician under special circumstances. We apologize for any inconvenience.     Follow-Up: At [facility], you and your health needs are our priority.  As part of our continuing mission to provide you with exceptional heart care, we have created designated Provider Care Teams.  These Care Teams include your primary Cardiologist (physician) and Advanced Practice Providers (APPs -  Physician Assistants and Nurse Practitioners)  who all work together to provide you with the care you need, when you need it.  We recommend signing up for the patient portal called MyChart.  Sign up information is provided on this After Visit Summary.  MyChart is used to connect with patients for Virtual Visits (Telemedicine).  Patients are able to view lab/test results, encounter notes, upcoming appointments, etc.  Non-urgent messages can be sent to your provider as well.   To learn more about what you can do with MyChart, go to [email or web address].    Your next appointment:   6 month(s)  Provider:   Oneil Parchment, MD     Other Instructions  Your physician wants you to follow-up in: 6 months.  You will receive a reminder letter in the mail two months in advance. If you don't receive a letter, please call our office to schedule the follow-up appointment.  DASH Eating Plan DASH stands for Dietary Approaches to Stop Hypertension. The DASH eating plan is a healthy eating plan that has been shown to: Lower high blood pressure (hypertension). Reduce your risk for type 2 diabetes, heart disease, and stroke. Help with weight loss. What are tips for following this plan? Reading food labels Check food labels for the amount of salt (sodium) per serving. Choose foods with less than 5 percent of the Daily Value (DV) of sodium. In general, foods with less than 300 milligrams (mg) of sodium per serving fit into this eating plan. To find whole grains, look for the word whole as the first word in the ingredient list. Shopping Buy products labeled as low-sodium or no salt added. Buy fresh foods. Avoid  canned foods and pre-made or frozen meals. Cooking Try not to add salt when you cook. Use salt-free seasonings or herbs instead of table salt or sea salt. Check with your health care provider or pharmacist before using salt substitutes. Do not fry foods. Cook foods in healthy ways, such as baking, boiling, grilling, roasting, or  broiling. Cook using oils that are good for your heart. These include olive, canola, avocado, soybean, and sunflower oil. Meal planning  Eat a balanced diet. This should include: 4 or more servings of fruits and 4 or more servings of vegetables each day. Try to fill half of your plate with fruits and vegetables. 6-8 servings of whole grains each day. 6 or less servings of lean meat, poultry, or fish each day. 1 oz is 1 serving. A 3 oz (85 g) serving of meat is about the same size as the palm of your hand. One egg is 1 oz (28 g). 2-3 servings of low-fat dairy each day. One serving is 1 cup (237 mL). 1 serving of nuts, seeds, or beans 5 times each week. 2-3 servings of heart-healthy fats. Healthy fats called omega-3 fatty acids are found in foods such as walnuts, flaxseeds, fortified milks, and eggs. These fats are also found in cold-water fish, such as sardines, salmon, and mackerel. Limit how much you eat of: Canned or prepackaged foods. Food that is high in trans fat, such as fried foods. Food that is high in saturated fat, such as fatty meat. Desserts and other sweets, sugary drinks, and other foods with added sugar. Full-fat dairy products. Do not salt foods before eating. Do not eat more than 4 egg yolks a week. Try to eat at least 2 vegetarian meals a week. Eat more home-cooked food and less restaurant, buffet, and fast food. Lifestyle When eating at a restaurant, ask if your food can be made with less salt or no salt. If you drink alcohol: Limit how much you have to: 0-1 drink a day if you are male. 0-2 drinks a day if you are male. Know how much alcohol is in your drink. In the [place] one drink is one 12 oz bottle of beer (355 mL), one 5 oz glass of wine (148 mL), or one 1 oz glass of hard liquor (44 mL). General information Avoid eating more than 2,300 mg of salt a day. If you have hypertension, you may need to reduce your sodium intake to 1,500 mg a day. Work with your  provider to stay at a healthy body weight or lose weight. Ask what the best weight range is for you. On most days of the week, get at least 30 minutes of exercise that causes your heart to beat faster. This may include walking, swimming, or biking. Work with your provider or dietitian to adjust your eating plan to meet your specific calorie needs. What foods should I eat? Fruits All fresh, dried, or frozen fruit. Canned fruits that are in their natural juice and do not have sugar added to them. Vegetables Fresh or frozen vegetables that are raw, steamed, roasted, or grilled. Low-sodium or reduced-sodium tomato and vegetable juice. Low-sodium or reduced-sodium tomato sauce and tomato paste. Low-sodium or reduced-sodium canned vegetables. Grains Whole-grain or whole-wheat bread. Whole-grain or whole-wheat pasta. Brown rice. [place]il Madeira. Bulgur. Whole-grain and low-sodium cereals. Pita bread. Low-fat, low-sodium crackers. Whole-wheat flour tortillas. Meats and other proteins Skinless chicken or turkey. Ground chicken or turkey. Pork with fat trimmed off. Fish and seafood. Egg whites. Dried beans,  peas, or lentils. Unsalted nuts, nut butters, and seeds. Unsalted canned beans. Lean cuts of beef with fat trimmed off. Low-sodium, lean precooked or cured meat, such as sausages or meat loaves. Dairy Low-fat (1%) or fat-free (skim) milk. Reduced-fat, low-fat, or fat-free cheeses. Nonfat, low-sodium ricotta or cottage cheese. Low-fat or nonfat yogurt. Low-fat, low-sodium cheese. Fats and oils Soft margarine without trans fats. Vegetable oil. Reduced-fat, low-fat, or light mayonnaise and salad dressings (reduced-sodium). Canola, safflower, olive, avocado, soybean, and sunflower oils. Avocado. Seasonings and condiments Herbs. Spices. Seasoning mixes without salt. Other foods Unsalted popcorn and pretzels. Fat-free sweets. The items listed above may not be all the foods and drinks you can have. Talk to a  dietitian to learn more. What foods should I avoid? Fruits Canned fruit in a light or heavy syrup. Fried fruit. Fruit in cream or butter sauce. Vegetables Creamed or fried vegetables. Vegetables in a cheese sauce. Regular canned vegetables that are not marked as low-sodium or reduced-sodium. Regular canned tomato sauce and paste that are not marked as low-sodium or reduced-sodium. Regular tomato and vegetable juices that are not marked as low-sodium or reduced-sodium. Dene. Olives. Grains Baked goods made with fat, such as croissants, muffins, or some breads. Dry pasta or rice meal packs. Meats and other proteins Fatty cuts of meat. Ribs. Fried meat. [place]. Bologna, salami, and other precooked or cured meats, such as sausages or meat loaves, that are not lean and low in sodium. Fat from the back of a pig (fatback). Bratwurst. Salted nuts and seeds. Canned beans with added salt. Canned or smoked fish. Whole eggs or egg yolks. Chicken or turkey with skin. Dairy Whole or 2% milk, cream, and half-and-half. Whole or full-fat cream cheese. Whole-fat or sweetened yogurt. Full-fat cheese. Nondairy creamers. Whipped toppings. Processed cheese and cheese spreads. Fats and oils Butter. Stick margarine. Lard. Shortening. Ghee. Bacon fat. Tropical oils, such as coconut, palm kernel, or palm oil. Seasonings and condiments Onion salt, garlic salt, seasoned salt, table salt, and sea salt. Worcestershire sauce. Tartar sauce. Barbecue sauce. Teriyaki sauce. Soy sauce, including reduced-sodium soy sauce. Steak sauce. Canned and packaged gravies. Fish sauce. Oyster sauce. Cocktail sauce. Store-bought horseradish. Ketchup. Mustard. Meat flavorings and tenderizers. Bouillon cubes. Hot sauces. Pre-made or packaged marinades. Pre-made or packaged taco seasonings. Relishes. Regular salad dressings. Other foods Salted popcorn and pretzels. The items listed above may not be all the foods and drinks you should avoid. Talk  to a dietitian to learn more. Where to find more information National Heart, Lung, and Blood Institute (NHLBI): buffalodrycleaner.gl American Heart Association (AHA): heart.org Academy of Nutrition and Dietetics: eatright.org National Kidney Foundation (NKF): kidney.org This information is not intended to replace advice given to you by your health care provider. Make sure you discuss any questions you have with your health care provider. Document Revised: 11/04/2022 Document Reviewed: 11/04/2022 Elsevier Patient Education  2024 Elsevier Inc.  Adopting a Healthy Lifestyle.   Weight: Know what a healthy weight is for you (roughly BMI <25) and aim to maintain this. You can calculate your body mass index on your smart phone. Unfortunately, this is not the most accurate measure of healthy weight, but it is the simplest measurement to use. A more accurate measurement involves body scanning which measures lean muscle, fat tissue and bony density. We do not have this equipment at [facility].    Diet: Aim for 7+ servings of fruits and vegetables daily Limit animal fats in diet for cholesterol and heart health - choose grass  fed whenever available Avoid highly processed foods (fast food burgers, tacos, fried chicken, pizza, hot dogs, french fries)  Saturated fat comes in the form of butter, lard, coconut oil, margarine, partially hydrogenated oils, and fat in meat. These increase your risk of cardiovascular disease.  Use healthy plant oils, such as olive, canola, soy, corn, sunflower and peanut.  Whole foods such as fruits, vegetables and whole grains have fiber  Men need > 38 grams of fiber per day Women need > 25 grams of fiber per day  Load up on vegetables and fruits - one-half of your plate: Aim for color and variety, and remember that potatoes dont count. Go for whole grains - one-quarter of your plate: Whole wheat, barley, wheat berries, quinoa, oats, brown rice, and foods made with them. If you want  pasta, go with whole wheat pasta. Protein power - one-quarter of your plate: Fish, chicken, beans, and nuts are all healthy, versatile protein sources. Limit red meat. You need carbohydrates for energy! The type of carbohydrate is more important than the amount. Choose carbohydrates such as vegetables, fruits, whole grains, beans, and nuts in the place of white rice, white pasta, potatoes (baked or fried), macaroni and cheese, cakes, cookies, and donuts.  If youre thirsty, drink water. Coffee and tea are good in moderation, but skip sugary drinks and limit milk and dairy products to one or two daily servings. Keep sugar intake at 6 teaspoons or 24 grams or LESS       Exercise: Aim for 150 min of moderate intensity exercise weekly for heart health, and weights twice weekly for bone health Stay active - any steps are better than no steps! Aim for 7-9 hours of sleep daily        1st Floor: - Lobby - Registration  - Pharmacy  - Lab - Cafe  2nd Floor: - PV Lab - Diagnostic Testing (echo, CT, nuclear med)  3rd Floor: - Vacant  4th Floor: - TCTS (cardiothoracic surgery) - AFib Clinic - Structural Heart Clinic - Vascular Surgery  - Vascular Ultrasound  5th Floor: - [facility] (general and EP) - Clinical Pharmacy for coumadin, hypertension, lipid, weight-loss medications, and med management appointments    Valet parking services will be available as well.

## 2023-12-28 ENCOUNTER — Ambulatory Visit (HOSPITAL_COMMUNITY): Payer: Medicare HMO | Attending: Cardiology

## 2023-12-28 DIAGNOSIS — I5022 Chronic systolic (congestive) heart failure: Secondary | ICD-10-CM

## 2023-12-28 DIAGNOSIS — E785 Hyperlipidemia, unspecified: Secondary | ICD-10-CM | POA: Diagnosis not present

## 2023-12-28 DIAGNOSIS — I7121 Aneurysm of the ascending aorta, without rupture: Secondary | ICD-10-CM | POA: Diagnosis not present

## 2023-12-28 DIAGNOSIS — I4819 Other persistent atrial fibrillation: Secondary | ICD-10-CM

## 2023-12-28 DIAGNOSIS — I255 Ischemic cardiomyopathy: Secondary | ICD-10-CM | POA: Diagnosis not present

## 2023-12-28 DIAGNOSIS — I10 Essential (primary) hypertension: Secondary | ICD-10-CM | POA: Diagnosis not present

## 2023-12-28 DIAGNOSIS — I25810 Atherosclerosis of coronary artery bypass graft(s) without angina pectoris: Secondary | ICD-10-CM | POA: Diagnosis not present

## 2023-12-28 LAB — ECHOCARDIOGRAM COMPLETE
AR max vel: 1.3 cm2
AV Area VTI: 1.32 cm2
AV Area mean vel: 1.31 cm2
AV Mean grad: 11.5 mm[Hg]
AV Peak grad: 19.7 mm[Hg]
Ao pk vel: 2.22 m/s
Area-P 1/2: 2.52 cm2
P 1/2 time: 576 ms
S' Lateral: 3.3 cm

## 2024-02-21 DIAGNOSIS — I5042 Chronic combined systolic (congestive) and diastolic (congestive) heart failure: Secondary | ICD-10-CM | POA: Diagnosis not present

## 2024-02-21 DIAGNOSIS — I10 Essential (primary) hypertension: Secondary | ICD-10-CM | POA: Diagnosis not present

## 2024-02-21 DIAGNOSIS — Z8673 Personal history of transient ischemic attack (TIA), and cerebral infarction without residual deficits: Secondary | ICD-10-CM | POA: Diagnosis not present

## 2024-02-21 DIAGNOSIS — E7800 Pure hypercholesterolemia, unspecified: Secondary | ICD-10-CM | POA: Diagnosis not present

## 2024-02-21 DIAGNOSIS — I480 Paroxysmal atrial fibrillation: Secondary | ICD-10-CM | POA: Diagnosis not present

## 2024-07-13 ENCOUNTER — Encounter: Payer: Self-pay | Admitting: Cardiology

## 2024-07-13 ENCOUNTER — Ambulatory Visit: Attending: Cardiology | Admitting: Cardiology

## 2024-07-13 VITALS — BP 120/57 | HR 64 | Ht 72.0 in | Wt 205.0 lb

## 2024-07-13 DIAGNOSIS — I255 Ischemic cardiomyopathy: Secondary | ICD-10-CM

## 2024-07-13 DIAGNOSIS — I5022 Chronic systolic (congestive) heart failure: Secondary | ICD-10-CM

## 2024-07-13 DIAGNOSIS — I4819 Other persistent atrial fibrillation: Secondary | ICD-10-CM

## 2024-07-13 DIAGNOSIS — I77819 Aortic ectasia, unspecified site: Secondary | ICD-10-CM | POA: Diagnosis not present

## 2024-07-13 DIAGNOSIS — Z7901 Long term (current) use of anticoagulants: Secondary | ICD-10-CM | POA: Diagnosis not present

## 2024-07-13 DIAGNOSIS — I25810 Atherosclerosis of coronary artery bypass graft(s) without angina pectoris: Secondary | ICD-10-CM

## 2024-07-13 NOTE — Patient Instructions (Signed)
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
## 2024-07-19 DIAGNOSIS — L570 Actinic keratosis: Secondary | ICD-10-CM | POA: Diagnosis not present

## 2024-07-19 DIAGNOSIS — L814 Other melanin hyperpigmentation: Secondary | ICD-10-CM | POA: Diagnosis not present

## 2024-07-19 DIAGNOSIS — E119 Type 2 diabetes mellitus without complications: Secondary | ICD-10-CM | POA: Diagnosis not present

## 2024-07-19 DIAGNOSIS — D1801 Hemangioma of skin and subcutaneous tissue: Secondary | ICD-10-CM | POA: Diagnosis not present

## 2024-07-23 DIAGNOSIS — H25813 Combined forms of age-related cataract, bilateral: Secondary | ICD-10-CM | POA: Diagnosis not present

## 2024-07-23 DIAGNOSIS — H179 Unspecified corneal scar and opacity: Secondary | ICD-10-CM | POA: Diagnosis not present

## 2024-07-23 DIAGNOSIS — H524 Presbyopia: Secondary | ICD-10-CM | POA: Diagnosis not present

## 2024-07-23 DIAGNOSIS — S098XXD Other specified injuries of head, subsequent encounter: Secondary | ICD-10-CM | POA: Diagnosis not present

## 2024-08-28 DIAGNOSIS — M25561 Pain in right knee: Secondary | ICD-10-CM | POA: Diagnosis not present

## 2024-08-28 DIAGNOSIS — D696 Thrombocytopenia, unspecified: Secondary | ICD-10-CM | POA: Diagnosis not present

## 2024-08-28 DIAGNOSIS — Z0000 Encounter for general adult medical examination without abnormal findings: Secondary | ICD-10-CM | POA: Diagnosis not present

## 2024-08-28 DIAGNOSIS — I712 Thoracic aortic aneurysm, without rupture, unspecified: Secondary | ICD-10-CM | POA: Diagnosis not present

## 2024-08-28 DIAGNOSIS — I480 Paroxysmal atrial fibrillation: Secondary | ICD-10-CM | POA: Diagnosis not present

## 2024-08-28 DIAGNOSIS — Z23 Encounter for immunization: Secondary | ICD-10-CM | POA: Diagnosis not present

## 2024-08-28 DIAGNOSIS — I5042 Chronic combined systolic (congestive) and diastolic (congestive) heart failure: Secondary | ICD-10-CM | POA: Diagnosis not present

## 2024-08-28 DIAGNOSIS — I10 Essential (primary) hypertension: Secondary | ICD-10-CM | POA: Diagnosis not present

## 2024-08-28 DIAGNOSIS — E7800 Pure hypercholesterolemia, unspecified: Secondary | ICD-10-CM | POA: Diagnosis not present

## 2024-08-28 DIAGNOSIS — Z1331 Encounter for screening for depression: Secondary | ICD-10-CM | POA: Diagnosis not present

## 2024-09-24 DIAGNOSIS — N5203 Combined arterial insufficiency and corporo-venous occlusive erectile dysfunction: Secondary | ICD-10-CM | POA: Diagnosis not present

## 2024-09-24 DIAGNOSIS — N401 Enlarged prostate with lower urinary tract symptoms: Secondary | ICD-10-CM | POA: Diagnosis not present

## 2024-09-24 DIAGNOSIS — R3912 Poor urinary stream: Secondary | ICD-10-CM | POA: Diagnosis not present

## 2024-09-24 DIAGNOSIS — Z8546 Personal history of malignant neoplasm of prostate: Secondary | ICD-10-CM | POA: Diagnosis not present

## 2024-09-25 DIAGNOSIS — H268 Other specified cataract: Secondary | ICD-10-CM | POA: Diagnosis not present

## 2024-09-25 DIAGNOSIS — H25813 Combined forms of age-related cataract, bilateral: Secondary | ICD-10-CM | POA: Diagnosis not present

## 2024-09-25 DIAGNOSIS — H2511 Age-related nuclear cataract, right eye: Secondary | ICD-10-CM | POA: Diagnosis not present

## 2024-10-03 DIAGNOSIS — H25812 Combined forms of age-related cataract, left eye: Secondary | ICD-10-CM | POA: Diagnosis not present

## 2024-10-09 DIAGNOSIS — H25812 Combined forms of age-related cataract, left eye: Secondary | ICD-10-CM | POA: Diagnosis not present

## 2024-10-09 DIAGNOSIS — H268 Other specified cataract: Secondary | ICD-10-CM | POA: Diagnosis not present
